# Patient Record
Sex: Female | Born: 1965 | Race: Black or African American | Hispanic: No | Marital: Single | Smoking: Current some day smoker
Health system: Southern US, Community
[De-identification: ages and names within clinical notes are randomized; demographics above are authoritative.]

## PROBLEM LIST (undated history)

## (undated) DIAGNOSIS — E119 Type 2 diabetes mellitus without complications: Secondary | ICD-10-CM

## (undated) DIAGNOSIS — I10 Essential (primary) hypertension: Secondary | ICD-10-CM

## (undated) DIAGNOSIS — I639 Cerebral infarction, unspecified: Secondary | ICD-10-CM

---

## 2004-05-21 ENCOUNTER — Ambulatory Visit: Payer: Self-pay | Admitting: Family Medicine

## 2006-06-08 DIAGNOSIS — E1129 Type 2 diabetes mellitus with other diabetic kidney complication: Secondary | ICD-10-CM | POA: Insufficient documentation

## 2012-01-11 ENCOUNTER — Emergency Department: Payer: Self-pay | Admitting: Internal Medicine

## 2012-01-11 LAB — URINALYSIS, COMPLETE
Bilirubin,UR: NEGATIVE
Glucose,UR: >=500 mg/dL (ref 0–75)
Nitrite: NEGATIVE
Ph: 7 (ref 4.5–8.0)
RBC,UR: 1 /HPF (ref 0–5)
Specific Gravity: 1.031 (ref 1.003–1.030)
Squamous Epithelial: 1
WBC UR: 1 /HPF (ref 0–5)

## 2012-01-16 ENCOUNTER — Ambulatory Visit: Payer: Self-pay | Admitting: Family Medicine

## 2012-02-02 ENCOUNTER — Ambulatory Visit: Payer: Self-pay

## 2012-02-02 LAB — HEMOGLOBIN A1C: Hemoglobin A1C: 10.3 % — ABNORMAL HIGH (ref 4.2–6.3)

## 2012-02-02 LAB — BASIC METABOLIC PANEL
Anion Gap: 7 (ref 7–16)
BUN: 11 mg/dL (ref 7–18)
Chloride: 105 mmol/L (ref 98–107)
Co2: 23 mmol/L (ref 21–32)
Creatinine: 0.66 mg/dL (ref 0.60–1.30)
EGFR (African American): >60
EGFR (Non-African Amer.): >60
Glucose: 219 mg/dL — ABNORMAL HIGH (ref 65–99)

## 2014-05-17 DIAGNOSIS — E559 Vitamin D deficiency, unspecified: Secondary | ICD-10-CM | POA: Insufficient documentation

## 2014-06-17 ENCOUNTER — Emergency Department
Admit: 2014-06-17 | Disposition: A | Discharge status: Home / Self Care | Payer: Self-pay | Admitting: Emergency Medicine

## 2014-06-17 LAB — PROTIME-INR
INR: 1
Prothrombin Time: 13.1 secs

## 2014-06-17 LAB — CBC
HCT: 27.7 % — ABNORMAL LOW (ref 35.0–47.0)
HGB: 9.1 g/dL — ABNORMAL LOW (ref 12.0–16.0)
MCH: 29.7 pg (ref 26.0–34.0)
MCHC: 32.9 g/dL (ref 32.0–36.0)
MCV: 90 fL (ref 80–100)
Platelet: 277 10*3/uL (ref 150–440)
RBC: 3.07 10*6/uL — ABNORMAL LOW (ref 3.80–5.20)
RDW: 14.9 % — AB (ref 11.5–14.5)
WBC: 10.7 10*3/uL (ref 3.6–11.0)

## 2014-06-17 LAB — BASIC METABOLIC PANEL
ANION GAP: 6 — AB (ref 7–16)
BUN: 14 mg/dL
CALCIUM: 8.5 mg/dL — AB
Chloride: 101 mmol/L
Co2: 25 mmol/L
Creatinine: 0.71 mg/dL
Glucose: 357 mg/dL — ABNORMAL HIGH
POTASSIUM: 4.6 mmol/L
Sodium: 132 mmol/L — ABNORMAL LOW

## 2014-06-17 LAB — GC/CHLAMYDIA PROBE AMP

## 2014-06-17 LAB — APTT: Activated PTT: 29.5 secs (ref 23.6–35.9)

## 2014-06-17 LAB — HCG, QUANTITATIVE, PREGNANCY: Beta Hcg, Quant.: <1 m[IU]/mL

## 2014-06-17 LAB — WET PREP, GENITAL

## 2014-08-04 ENCOUNTER — Emergency Department
Admission: EM | Admit: 2014-08-04 | Discharge: 2014-08-04 | Disposition: A | Discharge status: Home / Self Care | Payer: Self-pay | Attending: Student | Admitting: Student

## 2014-08-04 ENCOUNTER — Emergency Department: Payer: Self-pay

## 2014-08-04 ENCOUNTER — Other Ambulatory Visit: Payer: Self-pay

## 2014-08-04 DIAGNOSIS — Z79899 Other long term (current) drug therapy: Secondary | ICD-10-CM | POA: Insufficient documentation

## 2014-08-04 DIAGNOSIS — Z720 Tobacco use: Secondary | ICD-10-CM | POA: Insufficient documentation

## 2014-08-04 DIAGNOSIS — N7011 Chronic salpingitis: Secondary | ICD-10-CM | POA: Insufficient documentation

## 2014-08-04 DIAGNOSIS — E119 Type 2 diabetes mellitus without complications: Secondary | ICD-10-CM | POA: Insufficient documentation

## 2014-08-04 DIAGNOSIS — Z7982 Long term (current) use of aspirin: Secondary | ICD-10-CM | POA: Insufficient documentation

## 2014-08-04 DIAGNOSIS — I10 Essential (primary) hypertension: Secondary | ICD-10-CM | POA: Insufficient documentation

## 2014-08-04 DIAGNOSIS — R1031 Right lower quadrant pain: Secondary | ICD-10-CM

## 2014-08-04 HISTORY — DX: Type 2 diabetes mellitus without complications: E11.9

## 2014-08-04 HISTORY — DX: Essential (primary) hypertension: I10

## 2014-08-04 LAB — COMPREHENSIVE METABOLIC PANEL
ALT: 19 U/L (ref 14–54)
ANION GAP: 8 (ref 5–15)
AST: 21 U/L (ref 15–41)
Albumin: 3.9 g/dL (ref 3.5–5.0)
Alkaline Phosphatase: 56 U/L (ref 38–126)
BILIRUBIN TOTAL: 0.2 mg/dL — AB (ref 0.3–1.2)
BUN: 10 mg/dL (ref 6–20)
CALCIUM: 9.1 mg/dL (ref 8.9–10.3)
CO2: 24 mmol/L (ref 22–32)
Chloride: 104 mmol/L (ref 101–111)
Creatinine, Ser: 0.66 mg/dL (ref 0.44–1.00)
GFR calc Af Amer: >60 mL/min (ref >60)
GFR calc non Af Amer: >60 mL/min (ref >60)
Glucose, Bld: 216 mg/dL — ABNORMAL HIGH (ref 65–99)
Potassium: 3.8 mmol/L (ref 3.5–5.1)
Sodium: 136 mmol/L (ref 135–145)
TOTAL PROTEIN: 7.8 g/dL (ref 6.5–8.1)

## 2014-08-04 LAB — POCT PREGNANCY, URINE
PREG TEST UR: POSITIVE — AB
PREG TEST UR: NEGATIVE

## 2014-08-04 LAB — URINALYSIS COMPLETE WITH MICROSCOPIC (ARMC ONLY)
BILIRUBIN URINE: NEGATIVE
Bacteria, UA: NONE SEEN
Glucose, UA: >500 mg/dL — AB
Hgb urine dipstick: NEGATIVE
LEUKOCYTES UA: NEGATIVE
NITRITE: NEGATIVE
PH: 7 (ref 5.0–8.0)
Protein, ur: NEGATIVE mg/dL
SPECIFIC GRAVITY, URINE: 1.012 (ref 1.005–1.030)

## 2014-08-04 LAB — CBC
HEMATOCRIT: 27.5 % — AB (ref 35.0–47.0)
Hemoglobin: 8.6 g/dL — ABNORMAL LOW (ref 12.0–16.0)
MCH: 25 pg — ABNORMAL LOW (ref 26.0–34.0)
MCHC: 31.2 g/dL — ABNORMAL LOW (ref 32.0–36.0)
MCV: 80.1 fL (ref 80.0–100.0)
PLATELETS: 427 10*3/uL (ref 150–440)
RBC: 3.44 MIL/uL — ABNORMAL LOW (ref 3.80–5.20)
RDW: 18.4 % — ABNORMAL HIGH (ref 11.5–14.5)
WBC: 11.1 10*3/uL — AB (ref 3.6–11.0)

## 2014-08-04 LAB — LIPASE, BLOOD: LIPASE: 33 U/L (ref 22–51)

## 2014-08-04 MED ORDER — ONDANSETRON HCL 4 MG/2ML IJ SOLN
4.0000 mg | Freq: Once | INTRAMUSCULAR | Status: AC
  Administered 2014-08-04: 4 mg via INTRAVENOUS

## 2014-08-04 MED ORDER — OXYCODONE HCL 5 MG PO TABS: 5.0000 mg | ORAL_TABLET | Freq: Four times a day (QID) | ORAL | Status: DC | PRN

## 2014-08-04 MED ORDER — IBUPROFEN 600 MG PO TABS: 600.0000 mg | ORAL_TABLET | Freq: Four times a day (QID) | ORAL | Status: DC | PRN

## 2014-08-04 MED ORDER — IOHEXOL 240 MG/ML SOLN: 25.0000 mL | Freq: Once | INTRAMUSCULAR | Status: DC | PRN

## 2014-08-04 MED ORDER — MORPHINE SULFATE 4 MG/ML IJ SOLN
4.0000 mg | Freq: Once | INTRAMUSCULAR | Status: AC
  Administered 2014-08-04: 4 mg via INTRAVENOUS

## 2014-08-04 MED ORDER — ONDANSETRON HCL 4 MG/2ML IJ SOLN
INTRAMUSCULAR | Status: AC
  Administered 2014-08-04: 4 mg via INTRAVENOUS
  Filled 2014-08-04: qty 2

## 2014-08-04 MED ORDER — SODIUM CHLORIDE 0.9 % IV BOLUS (SEPSIS)
500.0000 mL | Freq: Once | INTRAVENOUS | Status: AC
  Administered 2014-08-04: 500 mL via INTRAVENOUS

## 2014-08-04 MED ORDER — IOHEXOL 300 MG/ML  SOLN
100.0000 mL | Freq: Once | INTRAMUSCULAR | Status: AC | PRN
  Administered 2014-08-04: 100 mL via INTRAVENOUS

## 2014-08-04 MED ORDER — MORPHINE SULFATE 4 MG/ML IJ SOLN
INTRAMUSCULAR | Status: AC
  Administered 2014-08-04: 4 mg via INTRAVENOUS
  Filled 2014-08-04: qty 1

## 2014-08-04 NOTE — ED Notes (Signed)
Done preg test on patient ,test was neg. Put information in meter, then meter  went blank then put on dock then results came up postive, notified dr.gayle and nurse Myriam Jacobson.

## 2014-08-04 NOTE — Discharge Instructions (Signed)
You were seen in the emergency department for right-sided abdominal pain which is caused by hydrosalpinx or swelling of the right fallopian tube. Please take all medications as prescribed and follow-up with Dr. Marcelline Mates in 4 days for reevaluation. Return immediately to the emergency room for severe or worsening pain, recurrent vomiting, blood in vomit or stool, inability to have a bowel movement, fever or for any other concerns.

## 2014-08-04 NOTE — ED Notes (Signed)
Pt c/o sudden onset right flank pain radiating into the pelvis area since 6am..states she was at work on break when the pain started..denies a hx of kidney stones

## 2014-08-04 NOTE — ED Notes (Signed)
Reports sudden onset lower abd pain today.  Tender to touch, denies n/v/d or vaginal dc.

## 2014-08-04 NOTE — ED Provider Notes (Signed)
[facility] Emergency Department Provider Note  ____________________________________________  Time seen: Approximately 9:17 AM  I have reviewed the triage vital signs and the nursing notes.   HISTORY  Chief Complaint Flank Pain and Abdominal Pain    HPI Lori Larsen is a 49 y.o. female with history of hypertension, diabetes present for evaluation of 3 hours sudden onset of flank pain radiated into the right lower quadrant. Pain is constant, sharp. Current severity is 8 out of 10. Not associated with any chest pain, difficulty breathing, nausea, vomiting, diarrhea, urinary complaints. Movement makes the pain worse. She reports the pain started suddenly while she was on her break at work.   Past Medical History  Diagnosis Date  . Diabetes mellitus without complication   . Hypertension     There are no active problems to display for this patient.   Past Surgical History  Procedure Laterality Date  . Cesarean section      Current Outpatient Rx  Name  Route  Sig  Dispense  Refill  . aspirin EC 81 MG tablet   Oral   Take 81 mg by mouth daily.         Marland Kitchen glipiZIDE (GLUCOTROL) 5 MG tablet   Oral   Take by mouth daily before breakfast.         . lisinopril (PRINIVIL,ZESTRIL) 10 MG tablet   Oral   Take 10 mg by mouth daily.         . pioglitazone (ACTOS) 30 MG tablet   Oral   Take 30 mg by mouth daily.         Marland Kitchen ibuprofen (ADVIL,MOTRIN) 600 MG tablet   Oral   Take 1 tablet (600 mg total) by mouth every 6 (six) hours as needed for moderate pain.   20 tablet   0   . oxyCODONE (ROXICODONE) 5 MG immediate release tablet   Oral   Take 1 tablet (5 mg total) by mouth every 6 (six) hours as needed for moderate pain. Do not drive while taking this medication.   15 tablet   0     Allergies Review of patient's allergies indicates no known allergies.  No family history on file.  Social History History  Substance Use Topics  .  Smoking status: Current Some Day Smoker -- 0.50 packs/day    Types: Cigarettes  . Smokeless tobacco: Never Used  . Alcohol Use: Yes     Comment: occassionally    Review of Systems Constitutional: No fever/chills Eyes: No visual changes. ENT: No sore throat. Cardiovascular: Denies chest pain. Respiratory: Denies shortness of breath. Gastrointestinal: + abdominal pain.  No nausea, no vomiting.  No diarrhea.  No constipation. Genitourinary: Negative for dysuria. Musculoskeletal: Negative for back pain. Skin: Negative for rash. Neurological: Negative for headaches, focal weakness or numbness.  10-point ROS otherwise negative.  ____________________________________________   PHYSICAL EXAM:  VITAL SIGNS: ED Triage Vitals  Enc Vitals Group     BP 08/04/14 0831 165/80 mmHg     Pulse Rate 08/04/14 0831 78     Resp 08/04/14 0831 18     Temp 08/04/14 0831 97.7 F (36.5 C)     Temp Source 08/04/14 0831 Oral     SpO2 08/04/14 0831 100 %     Weight 08/04/14 0831 147 lb (66.679 kg)     Height 08/04/14 0831 5' (1.524 m)     Head Cir --      Peak Flow --  Pain Score 08/04/14 0833 8     Pain Loc --      Pain Edu? --      Excl. in [place]? --     Constitutional: Alert and oriented. Well appearing and in no acute distress. Eyes: Conjunctivae are normal. PERRL. EOMI. Head: Atraumatic. Nose: No congestion/rhinnorhea. Mouth/Throat: Mucous membranes are moist.  Oropharynx non-erythematous. Neck: No stridor.  Cardiovascular: Normal rate, regular rhythm. Grossly normal heart sounds.  Good peripheral circulation. Respiratory: Normal respiratory effort.  No retractions. Lungs CTAB. Gastrointestinal: Soft with suprapubic and right lower quadrant tenderness, No distention. No abdominal bruits. + right CVA tenderness. Genitourinary: deferred Musculoskeletal: No lower extremity tenderness nor edema.  No joint effusions. Neurologic:  Normal speech and language. No gross focal neurologic deficits  are appreciated. Speech is normal. No gait instability. Skin:  Skin is warm, dry and intact. No rash noted. Psychiatric: Mood and affect are normal. Speech and behavior are normal.  ____________________________________________   LABS (all labs ordered are listed, but only abnormal results are displayed)  Labs Reviewed  CBC - Abnormal; Notable for the following:    WBC 11.1 (*)    RBC 3.44 (*)    Hemoglobin 8.6 (*)    HCT 27.5 (*)    MCH 25.0 (*)    MCHC 31.2 (*)    RDW 18.4 (*)    All other components within normal limits  COMPREHENSIVE METABOLIC PANEL - Abnormal; Notable for the following:    Glucose, Bld 216 (*)    Total Bilirubin 0.2 (*)    All other components within normal limits  URINALYSIS COMPLETEWITH MICROSCOPIC ([facility] ONLY) - Abnormal; Notable for the following:    Color, Urine STRAW (*)    APPearance CLEAR (*)    Glucose, UA >500 (*)    Ketones, ur 1+ (*)    Squamous Epithelial / LPF 6-30 (*)    All other components within normal limits  POCT PREGNANCY, URINE - Abnormal; Notable for the following:    Preg Test, Ur POSITIVE (*)    All other components within normal limits  LIPASE, BLOOD  POC URINE PREG, ED   ____________________________________________  EKG  ED ECG REPORT I, Joanne Gavel, the attending physician, personally viewed and interpreted this ECG.   Date: 08/04/2014  EKG Time: 09:37  Rate: 78  Rhythm: normal sinus rhythm  Axis: normal  Intervals: Normal  ST&T Change: No acute ST segment elevation, moderate voltage criteria for LVH, may be normal variant  ____________________________________________  RADIOLOGY   CT A/P  IMPRESSION: 1. Suspicion of right-sided hydrosalpinx. Consider pelvic ultrasound. 2. Normal appendix, without other explanation for right lower quadrant pain. 3. Age advanced atherosclerosis. 4. Probable left ovarian simple cyst. This could also be re-evaluated with ultrasound, given size of 4.8 cm. 5. Findings  which can be seen in pelvic congestion syndrome. 6. Possible constipation.  ____________________________________________   PROCEDURES  Procedure(s) performed: None  Critical Care performed: No  ____________________________________________   INITIAL IMPRESSION / ASSESSMENT AND PLAN / ED COURSE  Pertinent labs & imaging results that were available during my care of the patient were reviewed by me and considered in my medical decision making (see chart for details).  Lori Larsen is a 49 y.o. female with history of hypertension, diabetes present for evaluation of 3 hours sudden onset of flank pain radiated into the right lower quadrant. Vital signs stable. She does have tenderness to palpation in the right lower quadrant, in the super pubic regions as well  as right flank pain. Plan for screening labs, pain control, urinalysis and likely CT of the abdomen and pelvis to further evaluate cause of her pain.  ----------------------------------------- 2:47 PM on 08/04/2014 ----------------------------------------- CT consistent with right hydrosalpinx. Labs were very mild leukocytosis, chronic anemia which appears to be at baseline. Discussed with Dr. Enzo Bi of [facility] who recommended discharge with anti-inflammatories, pain control as well as follow up in clinic next week. I discussed this with the patient. We discussed meticulous return precautions. She is comfortable with discharge plan. Of note, urine pregnancy test was entered as positive - this was in error, the actual urine pregnancy test result was negative.  ____________________________________________   FINAL CLINICAL IMPRESSION(S) / ED DIAGNOSES  Final diagnoses:  Hydrosalpinx  RLQ abdominal pain      Joanne Gavel, MD 08/04/14 1615

## 2014-08-04 NOTE — ED Notes (Signed)
preg test was redone and results neg.

## 2014-08-04 NOTE — ED Notes (Signed)
Pt completed CT contrast.  CT aware

## 2014-08-04 NOTE — ED Notes (Signed)
Pt resting on stretcher, awaiting re-eval.  No distress.

## 2014-10-26 ENCOUNTER — Emergency Department: Payer: PRIVATE HEALTH INSURANCE

## 2014-10-26 ENCOUNTER — Encounter: Payer: Self-pay | Admitting: Emergency Medicine

## 2014-10-26 ENCOUNTER — Other Ambulatory Visit: Payer: Self-pay

## 2014-10-26 ENCOUNTER — Emergency Department
Admission: EM | Admit: 2014-10-26 | Discharge: 2014-10-26 | Disposition: A | Discharge status: Home / Self Care | Payer: PRIVATE HEALTH INSURANCE | Attending: Emergency Medicine | Admitting: Emergency Medicine

## 2014-10-26 DIAGNOSIS — R42 Dizziness and giddiness: Secondary | ICD-10-CM | POA: Insufficient documentation

## 2014-10-26 DIAGNOSIS — R1110 Vomiting, unspecified: Secondary | ICD-10-CM | POA: Diagnosis present

## 2014-10-26 DIAGNOSIS — Z720 Tobacco use: Secondary | ICD-10-CM | POA: Diagnosis not present

## 2014-10-26 DIAGNOSIS — E119 Type 2 diabetes mellitus without complications: Secondary | ICD-10-CM | POA: Insufficient documentation

## 2014-10-26 DIAGNOSIS — I10 Essential (primary) hypertension: Secondary | ICD-10-CM | POA: Diagnosis not present

## 2014-10-26 LAB — COMPREHENSIVE METABOLIC PANEL
ALBUMIN: 4.1 g/dL (ref 3.5–5.0)
ALK PHOS: 49 U/L (ref 38–126)
ALT: 12 U/L — AB (ref 14–54)
AST: 22 U/L (ref 15–41)
Anion gap: 10 (ref 5–15)
BUN: 11 mg/dL (ref 6–20)
CALCIUM: 9.5 mg/dL (ref 8.9–10.3)
CO2: 24 mmol/L (ref 22–32)
CREATININE: 0.59 mg/dL (ref 0.44–1.00)
Chloride: 104 mmol/L (ref 101–111)
GFR calc Af Amer: >60 mL/min (ref >60)
GFR calc non Af Amer: >60 mL/min (ref >60)
GLUCOSE: 206 mg/dL — AB (ref 65–99)
Potassium: 3.8 mmol/L (ref 3.5–5.1)
SODIUM: 138 mmol/L (ref 135–145)
Total Bilirubin: 0.5 mg/dL (ref 0.3–1.2)
Total Protein: 8.1 g/dL (ref 6.5–8.1)

## 2014-10-26 LAB — CBC
HCT: 33.4 % — ABNORMAL LOW (ref 35.0–47.0)
Hemoglobin: 10.6 g/dL — ABNORMAL LOW (ref 12.0–16.0)
MCH: 24.7 pg — AB (ref 26.0–34.0)
MCHC: 31.6 g/dL — ABNORMAL LOW (ref 32.0–36.0)
MCV: 78.2 fL — ABNORMAL LOW (ref 80.0–100.0)
PLATELETS: 362 10*3/uL (ref 150–440)
RBC: 4.27 MIL/uL (ref 3.80–5.20)
RDW: 22.2 % — ABNORMAL HIGH (ref 11.5–14.5)
WBC: 11.4 10*3/uL — ABNORMAL HIGH (ref 3.6–11.0)

## 2014-10-26 MED ORDER — MECLIZINE HCL 25 MG PO TABS: 25.0000 mg | ORAL_TABLET | Freq: Three times a day (TID) | ORAL | Status: DC | PRN

## 2014-10-26 MED ORDER — DIAZEPAM 5 MG PO TABS: 5.0000 mg | ORAL_TABLET | Freq: Three times a day (TID) | ORAL | Status: DC | PRN

## 2014-10-26 MED ORDER — DIAZEPAM 5 MG/ML IJ SOLN
5.0000 mg | Freq: Once | INTRAMUSCULAR | Status: AC
  Administered 2014-10-26: 5 mg via INTRAVENOUS

## 2014-10-26 MED ORDER — ONDANSETRON HCL 4 MG/2ML IJ SOLN
4.0000 mg | Freq: Once | INTRAMUSCULAR | Status: AC
  Administered 2014-10-26: 4 mg via INTRAVENOUS

## 2014-10-26 MED ORDER — SODIUM CHLORIDE 0.9 % IV SOLN
1000.0000 mL | Freq: Once | INTRAVENOUS | Status: AC
  Administered 2014-10-26: 1000 mL via INTRAVENOUS

## 2014-10-26 MED ORDER — MECLIZINE HCL 25 MG PO TABS
ORAL_TABLET | ORAL | Status: AC
  Filled 2014-10-26: qty 2

## 2014-10-26 MED ORDER — MECLIZINE HCL 25 MG PO TABS
50.0000 mg | ORAL_TABLET | Freq: Once | ORAL | Status: AC
  Administered 2014-10-26: 50 mg via ORAL

## 2014-10-26 MED ORDER — ONDANSETRON HCL 4 MG/2ML IJ SOLN
INTRAMUSCULAR | Status: AC
  Filled 2014-10-26: qty 2

## 2014-10-26 MED ORDER — DIAZEPAM 5 MG/ML IJ SOLN
INTRAMUSCULAR | Status: AC
  Filled 2014-10-26: qty 2

## 2014-10-26 NOTE — ED Notes (Signed)
Pt reports vomiting and dizziness..  Pt also has states having diff hearing out of right ear.  No abd pain.  No diarrhea.  Pt actively vomiting now.  Sx began last night.

## 2014-10-26 NOTE — Discharge Instructions (Signed)

## 2014-10-26 NOTE — ED Notes (Signed)
Pt presents with vomiting that started last night, denies any diarrhea or abd pain.

## 2014-10-26 NOTE — ED Provider Notes (Signed)
[facility] Emergency Department Provider Note     Time seen: ----------------------------------------- 6:32 PM on 10/26/2014 -----------------------------------------    I have reviewed the triage vital signs and the nursing notes.   HISTORY  Chief Complaint Emesis    HPI Lori Larsen is a 49 y.o. female who presents ER with sudden onset of vomiting and dizziness. Patient reports not being able to hear well out of the right ear. Patient has had this happen once before, I brought an actively vomiting. Patient states symptoms began last night, when she moves suddenly the symptoms seemed to worsen. Denies any fevers chills or other complaints. Denies any recent illness.   Past Medical History  Diagnosis Date  . Diabetes mellitus without complication   . Hypertension     There are no active problems to display for this patient.   Past Surgical History  Procedure Laterality Date  . Cesarean section      Allergies Review of patient's allergies indicates no known allergies.  Social History Social History  Substance Use Topics  . Smoking status: Current Some Day Smoker -- 0.50 packs/day    Types: Cigarettes  . Smokeless tobacco: Never Used  . Alcohol Use: Yes     Comment: occassionally    Review of Systems Constitutional: Negative for fever. Eyes: Negative for visual changes. ENT: Negative for sore throat. Positive for decreased hearing in the right ear Cardiovascular: Negative for chest pain. Respiratory: Negative for shortness of breath. Gastrointestinal: Negative for abdominal pain, vomiting and diarrhea. Genitourinary: Negative for dysuria. Musculoskeletal: Negative for back pain. Skin: Negative for rash. Neurological: Negative for headaches, focal weakness or numbness. Positive for dizziness  10-point ROS otherwise negative.  ____________________________________________   PHYSICAL EXAM:  VITAL SIGNS: ED Triage Vitals   Enc Vitals Group     BP 10/26/14 1814 200/88 mmHg     Pulse Rate 10/26/14 1814 68     Resp 10/26/14 1814 20     Temp 10/26/14 1814 98.4 F (36.9 C)     Temp Source 10/26/14 1814 Oral     SpO2 10/26/14 1814 100 %     Weight 10/26/14 1814 141 lb (63.957 kg)     Height 10/26/14 1814 5' (1.524 m)     Head Cir --      Peak Flow --      Pain Score --      Pain Loc --      Pain Edu? --      Excl. in [place]? --     Constitutional: Alert and oriented. Mild distress. Eyes: Conjunctivae are normal. PERRL. Normal extraocular movements. No nystagmus ENT   Head: Normocephalic and atraumatic.   Nose: No congestion/rhinnorhea.   Mouth/Throat: Mucous membranes are moist.   Neck: No stridor. Cardiovascular: Normal rate, regular rhythm. Normal and symmetric distal pulses are present in all extremities. No murmurs, rubs, or gallops. Respiratory: Normal respiratory effort without tachypnea nor retractions. Breath sounds are clear and equal bilaterally. No wheezes/rales/rhonchi. Gastrointestinal: Soft and nontender. No distention. No abdominal bruits.  Musculoskeletal: Nontender with normal range of motion in all extremities. No joint effusions.  No lower extremity tenderness nor edema. Neurologic:  Normal speech and language. No gross focal neurologic deficits are appreciated. Speech is normal. No gait instability. Skin:  Skin is warm, dry and intact. No rash noted. Psychiatric: Mood and affect are normal. Speech and behavior are normal. Patient exhibits appropriate insight and judgment. ____________________________________________  EKG: Interpreted by me. Normal sinus rhythm with  a rate of 71 bpm, normal PR interval, normal QS with, LVH  ____________________________________________  ED COURSE:  Pertinent labs & imaging results that were available during my care of the patient were reviewed by me and considered in my medical decision making (see chart for details). Symptoms consistent  with peripheral vertigo. We'll confirm with labs and CT. She'll receive IV fluids, IV Valium, Zofran and meclizine. ____________________________________________    LABS (pertinent positives/negatives)  Labs Reviewed  COMPREHENSIVE METABOLIC PANEL - Abnormal; Notable for the following:    Glucose, Bld 206 (*)    ALT 12 (*)    All other components within normal limits  CBC - Abnormal; Notable for the following:    WBC 11.4 (*)    Hemoglobin 10.6 (*)    HCT 33.4 (*)    MCV 78.2 (*)    MCH 24.7 (*)    MCHC 31.6 (*)    RDW 22.2 (*)    All other components within normal limits  URINALYSIS COMPLETEWITH MICROSCOPIC ([facility] ONLY)    RADIOLOGY Images were viewed by me  CT head FINDINGS: The ventricles are normal in size and configuration. No extra-axial fluid collections are identified. The gray-white differentiation is normal. No CT findings for acute intracranial process such as hemorrhage or infarction. No mass lesions. The brainstem and cerebellum are grossly normal.  The bony structures are intact. The paranasal sinuses and mastoid air cells are clear. The globes are intact.  IMPRESSION: Normal head CT  ____________________________________________  FINAL ASSESSMENT AND PLAN  Vertigo  Plan: Patient with labs and imaging as dictated above. Patient is feeling better, requesting to go home. Her labs and CT were unremarkable. She'll be discharged with meclizine and Valium. She is stable for follow-up with ENT.   Earleen Newport, MD   Earleen Newport, MD 10/26/14 [id number]

## 2014-11-16 ENCOUNTER — Ambulatory Visit: Payer: Self-pay | Admitting: Obstetrics and Gynecology

## 2014-12-05 ENCOUNTER — Ambulatory Visit: Payer: PRIVATE HEALTH INSURANCE | Attending: Otolaryngology

## 2014-12-05 ENCOUNTER — Encounter: Payer: Self-pay | Admitting: Physical Therapy

## 2014-12-05 DIAGNOSIS — R29818 Other symptoms and signs involving the nervous system: Secondary | ICD-10-CM | POA: Insufficient documentation

## 2014-12-05 DIAGNOSIS — R2689 Other abnormalities of gait and mobility: Secondary | ICD-10-CM

## 2014-12-05 DIAGNOSIS — R42 Dizziness and giddiness: Secondary | ICD-10-CM

## 2014-12-05 NOTE — Patient Instructions (Signed)
Issued VOR x 1 horizontal 1 min x 3, 4 times/day for HEP

## 2014-12-05 NOTE — Therapy (Signed)
[place] MAIN [facility] SERVICES [address] [place], [place], [address] Phone: [phone]   Fax:  [phone]  Physical Therapy Evaluation  Patient Details  Name: Lori Larsen MRN: [id number] Date of Birth: [birth year] Referring Provider:  Carloyn Manner, MD  Encounter Date: 12/05/2014      PT End of Session - 12/05/14 1210    Visit Number 1   Number of Visits 7   Date for PT Re-Evaluation 01/19/15   Authorization Type no g codes   PT Start Time 1100   PT Stop Time 1200   PT Time Calculation (min) 60 min   Activity Tolerance Patient tolerated treatment well   Behavior During Therapy [facility] for tasks assessed/performed      Past Medical History  Diagnosis Date  . Diabetes mellitus without complication   . Hypertension     Past Surgical History  Procedure Laterality Date  . Cesarean section      There were no vitals filed for this visit.  Visit Diagnosis:  Dizziness and giddiness - Plan: PT plan of care cert/re-cert  Balance disorder - Plan: PT plan of care cert/re-cert      Subjective Assessment - 12/05/14 1206    Subjective Dizziness improving since onset 10/25/14   Pertinent History  Pt reports that she was at work and started having nausea and vomiting. Pt was having vertigo and fell at work needing help to stand up. Son brought patient to emergency room where she was diagnosed with vertigo. Head CT was negative at that time. Pt reports she had decreased hearing in R ear as well. She was referred to ENT where she saw Dr. Virgia Land. She was placed on a steroid which she reports helped a little. Symptoms gradually improved over time. Pt reports she had VNG study which showed "50% loss." Pt unclear if it is loss of hearing or caloric deficit. Pt reports intermittent muffling in R ear which has persisted. Pt denies numbness/tingling in face, trunk or UE/LE. Pt reports some new onset RUE weakness since symptoms started.    Currently in Pain? No/denies      VESTIBULAR AND BALANCE EVALUATION  Onset Date:  10/25/14   HISTORY:  Symptoms at onset: nausea, vomiting, and vertigo Description of dizziness: (vertigo, unsteadiness, lightheadedness, falling, general unsteadiness, aural fullness)____"A little dizzy." Denies further vertigo____________ Symptom nature: (motion provoked/positional/spontaneous/constant, variable, intermittent) ___Intermittent, motion provoked. Never spontaneous___ Frequency: Every other day Duration: 30 min to 1 hour  Provocative Factors: Standing up quickly, turn head quickly Easing Factors: Staying still Progression of symptoms: (better, worse, no change since onset): Better History of similar episodes: No Falls (yes/no): Yes Number of falls in past 6 months: 2 falls (one since symptoms started)  Subjective history of current problem: Pt reports that she was at work and started having nausea and vomiting. Pt was having vertigo and fell at work needing help to stand up. Son brought patient to emergency room where she was diagnosed with vertigo. Head CT was negative at that time. Pt reports she had decreased hearing in R ear as well. She was referred to ENT where she saw Dr. Virgia Land. She was placed on a steroid which she reports helped a little. Symptoms gradually improved over time. Pt reports she had VNG study which showed "50% loss." Pt unclear if it is loss of hearing or caloric deficit. Pt reports intermittent muffling in R ear which has persisted. Pt denies numbness/tingling in face, trunk or UE/LE. Pt reports some new  onset RUE weakness since symptoms started.   Subjective Prior Functional Level: Fully independent, working prior to onset of symptoms.   Auditory complaints (tinnitus, pain, drainage): No Vision: (last eye exam, diplopia, recent changes): No changes currently, initially some blurring of vision. Last eye exam was 2 years ago.   Current Symptoms: (vertigo, N & V,  dysarthria, dysphagia, drop attacks, bowel and bladder changes, recent weight loss/gain, lightheadedness, headache, rocking, general unsteadiness, imbalance, tilting, swaying, veering, dizzy, oscillopsia, migraines): Negative for red flags. Intermittent headaches, no history of migraines.   Goals for Therapy: "Improve balance"  EXAMINATION:  POSTURE:_Slight forward head and neck. Overall grossly WNL______  NEUROLOGICAL SCREEN: (2+ unless otherwise noted.)  Level Dermatome R L Myotome R L Reflex R L  C3 Anterior Neck N N Sidebend C2-3 N N Jaw CN V    C4 Top of Shoulder N N Shoulder Shrug C4 N N Hoffman's UMN N N  C5 Lateral Upper Arm N N Shoulder ABD C4-5 N N Biceps C5-6 1 1   C6 Lateral Arm/ Thumb N N Arm Flex/ Wrist Ext C5-6 N N Brachiorad. C5-6 1 1   C7 Middle Finger N N Arm Ext//Wrist Flex C6-7 N N Triceps C7    C8 4th & 5th Finger N N Flex/ Ext Carpi Ulnaris C8 N N Patella 2 3  T1 Medial Arm N N Interossei T1 N N Gastrocnemius 1 0  L2 Medial thigh/groin N N Illiopsoas (L2-3) N N     L3 Lower thigh/med.knee N N Quadriceps (L3-4) N N     L4 Medial leg/lat thigh N N Tibialis Ant (L4-5) N N     L5 Lat. leg & dorsal foot N N EHL (L5) N N     S1 post/lat foot/thigh/leg N N Gastrocnemius (S1-2) N N Gastrocnemius (S1) N N  S2 Post./med. thigh & leg N N Hamstrings (L4-S3) N N Babinski N A   N=normal  Ab=abnormal  Clonus = 4+ (Reflexes only)  Hyper = 3+      Normal = 2+     Hypo = 1+   Absent=0  No clonus spasticity or rigidity    SOMATOSENSORY:        Sensation           Intact      Diminished         Absent    Hypersenstive  Light touch N Bil UE/LE     Sharp / dull      Proprioception      Kinesthesia      Other:      Any N & T in extremities or weakness:      COORDINATION: Rapid Alt Movements:  Normal Finger to Nose: Normal Pronator Drift: Normal    MUSCULOSKELETAL SCREEN: Cervical Spine ROM:  WNL and painless  ROM: WFL  MMT:  WFL, at least 4 to 4+/5 throughout UE/LE  without focal weakness identified. Grip strength is grossly symmetrical, pt is R handed  Functional Mobility: No deficits identified  Gait:Grossly functional without obvious deficits. Pt does have mild lateral veering but easily self-corrected. Full gait assessment with head turning deferred at this time.  Scanning of visual environment with gait is: Good  Balance: Standardized-balance testing deferred due to patient education.     OCULOMOTOR / VESTIBULAR TESTING:  Oculomotor Exam- Room Light  Normal Abnormal Comments  Ocular Alignment N    Ocular ROM N    Spontaneous Nystagmus N    End-Gaze Nystagmus  A Mid and  end range abnormal nystagmus to the L, appropriate end range nystagmus to the R  Smooth Pursuit  A Mid range L horizontal nystagmus, absent with R gaze, saccades throughout smooth pursuit  Saccades  A Poor trajectory in bilaterally in both horizontal and vertical plane  VOR  A 4/10 Dizziness, no blurring  VOR Cancellation  A Dizziness  Left Head Thrust N    Right Head Thrust  A Corrective saccade with dizziness  Head Shaking Nystagmus   Deferred due to severe dizziness with attempt  Static Acuity     Dynamic Acuity       BPPV TESTS:  Symptoms Duration Intensity Nystagmus  L Dix-Hallpike Negative   No  R Dix-Hallpike Negative   No  L Head Roll      R Head Roll      L Sidelying Test      R Sidelying Test        MEASURES:  Results Comments  DHI 36/100   ABC Scale 76.25% Above cut-off for fall risk  DGI    10 meter Walking Speed    FTSTS    SLS    Berg Balance Test    TUG    TUG- Carry    TUG- Cognitive            [facility] PT Assessment - 12/05/14 0001    Assessment   Medical Diagnosis Vestibular neuronitis   Onset Date/Surgical Date 10/25/14   Next MD Visit Not provided   Prior Therapy No   Precautions   Precautions None   Restrictions   Weight Bearing Restrictions No   Balance Screen   Has the patient fallen in the past 6 months Yes   How many  times? 2   Has the patient had a decrease in activity level because of a fear of falling?  Yes   Is the patient reluctant to leave their home because of a fear of falling?  Yes   [place] residence   Type of [place] to enter   Entrance Stairs-Number of Steps 3   Entrance Stairs-Rails None   Home Layout One level   Prior Function   Level of Independence Independent   Vocation Full time employment  Out of work since onset of symptoms   Cognition   Overall Cognitive Status Within Functional Limits for tasks assessed   Observation/Other Assessments   Other Surveys  Other Surveys   Activities of Balance Confidence Scale (ABC Scale)  76.25%   Dizziness Handicap Inventory ([place])  36/100       TREATMENT: Pt educated and provided handout regarding VOR x 1 horizontal. Performed for 60 seconds x 2 with patient with heavy verbal and tactile cues for correction of form/technique. Pt reports significant dizziness during exercise. She becomes tearful due to frustration. Education and comfort provided regarding prognosis.                     PT Education - 12/05/14 1209    Education provided Yes   Education Details HEP, education about diagnosis and prognosis   Person(s) Educated Patient   Methods Explanation;Demonstration;Handout   Comprehension Verbalized understanding;Returned demonstration             PT Long Term Goals - 12/05/14 1212    PT LONG TERM GOAL #1   Title Pt will report independence with HEP for self-management of symptoms by 01/19/15   Status New  PT LONG TERM GOAL #2   Title Pt will demonstrate decrease in [place] by 18 points in order to demonstrate clinically significant reduction in symptoms and disability by 01/19/15   Baseline 12/05/14: 36/100   Status New   PT LONG TERM GOAL #3   Title Pt will report no further epsiodes of dizziness in order to resume driving and working without  limitations by 01/19/15   Status New               Plan - 12/05/14 1210    Clinical Impression Statement Pt is a pleasant 49 year-old female 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 Pt reports symptoms are improving but she still has episodes every other day which last 30 minutes to 1 hour. PT examination reveals no spontaneous nystamgus but L horizontal nystagmus at mid and end range L gaze. Nystagmus worsens with L gaze and resolves with R gaze. Positive VOR. Positive VOR head thrust to the R with corrective saccades noted (negative to the L). Poor trajectory with horizontal and vertical saccade testing as well as smooth pursuit. Patient has some central and peripheral signs but symptoms are more consistent with a R unilateral caloric deficit. Pt reports abnormal VNG testing but is unable to recall results. Pt will benefit from skilled vestibular therapy to address deficits noted above to improve balance and decrease symptoms so she can return to full function at home and work.    Pt will benefit from skilled therapeutic intervention in order to improve on the following deficits Dizziness;Abnormal gait   Rehab Potential Excellent   Clinical Impairments Affecting Rehab Potential Positive: motivation, recent onset, Negative: age   PT Frequency 1x / week   PT Duration 6 weeks   PT Treatment/Interventions Canalith Repostioning;Gait training;Therapeutic activities;Therapeutic exercise;Balance training;Neuromuscular re-education;Manual techniques;Vestibular   PT Next Visit Plan BERG, DGI, firm/foam; review and progress VOR   PT Home Exercise Plan VOR x 1 horizontal 1 min x 3, 4 times/day   Consulted and Agree with Plan of Care Patient         Problem List There are no active problems to display for this patient.  Phillips Grout PT, DPT   Huprich,Jason 12/05/2014, 12:27 PM  [place] MAIN [facility]  SERVICES [address] [place], [place], [address] Phone: [phone]   Fax:  [phone]

## 2014-12-14 ENCOUNTER — Ambulatory Visit: Payer: Self-pay | Admitting: Obstetrics and Gynecology

## 2014-12-18 ENCOUNTER — Encounter: Payer: Self-pay | Admitting: Physical Therapy

## 2014-12-18 ENCOUNTER — Ambulatory Visit: Payer: PRIVATE HEALTH INSURANCE | Attending: Otolaryngology

## 2014-12-18 DIAGNOSIS — R29818 Other symptoms and signs involving the nervous system: Secondary | ICD-10-CM | POA: Insufficient documentation

## 2014-12-18 DIAGNOSIS — R42 Dizziness and giddiness: Secondary | ICD-10-CM | POA: Diagnosis present

## 2014-12-18 DIAGNOSIS — R2689 Other abnormalities of gait and mobility: Secondary | ICD-10-CM

## 2014-12-18 NOTE — Therapy (Signed)
[place] MAIN [facility] SERVICES [address] [place], [place], [address] Phone: [phone]   Fax:  [phone]  Physical Therapy Treatment  Patient Details  Name: MAEGEN WIGLE MRN: [id number] Date of Birth: 1965/06/30 Referring Provider:  Carloyn Manner, MD  Encounter Date: 12/18/2014      PT End of Session - 12/18/14 1058    Visit Number 2   Number of Visits 7   Date for PT Re-Evaluation 01/19/15   Authorization Type no g codes   PT Start Time 0920   PT Stop Time 1005   PT Time Calculation (min) 45 min   Equipment Utilized During Treatment Gait belt   Activity Tolerance Patient tolerated treatment well   Behavior During Therapy [facility] for tasks assessed/performed      Past Medical History  Diagnosis Date  . Diabetes mellitus without complication ([place])   . Hypertension     Past Surgical History  Procedure Laterality Date  . Cesarean section      There were no vitals filed for this visit.  Visit Diagnosis:  Dizziness and giddiness  Balance disorder      Subjective Assessment - 12/18/14 0921    Subjective Pt reports her symptoms are improving. "I haven't been really dizzy lately." HEP went well without any questions or concerns. She was able to perform them consistently.    Pertinent History  Pt reports that she was at work and started having nausea and vomiting. Pt was having vertigo and fell at work needing help to stand up. Son brought patient to emergency room where she was diagnosed with vertigo. Head CT was negative at that time. Pt reports she had decreased hearing in R ear as well. She was referred to ENT where she saw Dr. Virgia Land. She was placed on a steroid which she reports helped a little. Symptoms gradually improved over time. Pt reports she had VNG study which showed "50% loss." Pt unclear if it is loss of hearing or caloric deficit. Pt reports intermittent muffling in R ear which has persisted. Pt denies  numbness/tingling in face, trunk or UE/LE. Pt reports some new onset RUE weakness since symptoms started.    Currently in Pain? No/denies            [facility] PT Assessment - 12/18/14 0001    Standardized Balance Assessment   Standardized Balance Assessment Berg Balance Test;Dynamic Gait Index   Berg Balance Test   Sit to Stand Able to stand without using hands and stabilize independently   Standing Unsupported Able to stand safely 2 minutes   Sitting with Back Unsupported but Feet Supported on Floor or Stool Able to sit safely and securely 2 minutes   Stand to Sit Sits safely with minimal use of hands   Transfers Able to transfer safely, minor use of hands   Standing Unsupported with Eyes Closed Able to stand 10 seconds safely   Standing Ubsupported with Feet Together Able to place feet together independently and stand 1 minute safely   From Standing, Reach Forward with Outstretched Arm Can reach confidently >25 cm (10")   From Standing Position, Pick up Object from Floor Able to pick up shoe safely and easily   From Standing Position, Turn to Look Behind Over each Shoulder Looks behind from both sides and weight shifts well   Turn 360 Degrees Able to turn 360 degrees safely in 4 seconds or less   Standing Unsupported, Alternately Place Feet on Step/Stool Able to stand  independently and safely and complete 8 steps in 20 seconds   Standing Unsupported, One Foot in Front Able to plae foot ahead of the other independently and hold 30 seconds   Standing on One Leg Able to lift leg independently and hold equal to or more than 3 seconds   Total Score 53   Berg comment: Single leg stance approximately 4.5 seconds   Dynamic Gait Index   Level Surface Normal   Change in Gait Speed Normal   Gait with Horizontal Head Turns Normal   Gait with Vertical Head Turns Normal   Gait and Pivot Turn Normal   Step Over Obstacle Normal   Step Around Obstacles Normal   Steps Normal   Total Score 24   DGI  comment: No dificits noted   High Level Balance   High Level Balance Comments Firm/foam: >30 seconds on firm with eyes open/closed. Foam: eyes open: 16.5 seconds, eyes closed: 3 seconds, fall always occurs to the L        Performance Measures: Performed BERG, DGI, Firm/foam with patient; See above for results;  Neuromuscular Re-education: NBOS eyes open/closed x 30 seconds, added horizontal and vertical head turns followed by horizontal head and body turns x 30 seconds each; Semi-tandem progression  eyes open/closed x 30 seconds, added horizontal and vertical head turns followed by horizontal head and body turns x 30 seconds each; tandem eyes open/closed followed by horizontal head turns;  VOR: VOR x 1 horizontal in sitting 60 seconds x 2; VOR x 1 horizontal in standing 60 seconds x 2; Cues required to correct form and speed. Pt is stopping and end range and moving far too slow. Eyes occasionally deviate from target. Pt requires correction and is able to integrate cues from therapist. Pt denies dizziness during VOR exercise;  Printed HEP issued and reviewed with patient to ensure understanding.                      PT Education - 12/18/14 1058    Education provided Yes   Education Details HEP correction and progression   Person(s) Educated Patient   Methods Explanation;Demonstration;Handout;Verbal cues   Comprehension Verbalized understanding;Returned demonstration             PT Long Term Goals - 12/18/14 1102    PT LONG TERM GOAL #1   Title Pt will report independence with HEP for self-management of symptoms by 01/19/15   Status New   PT LONG TERM GOAL #2   Title Pt will demonstrate decrease in [place] by 18 points in order to demonstrate clinically significant reduction in symptoms and disability by 01/19/15   Baseline 12/05/14: 36/100   Status New   PT LONG TERM GOAL #3   Title Pt will report no further epsiodes of dizziness in order to resume driving and  working without limitations by 01/19/15   Status New   PT LONG TERM GOAL #4   Title Pt will demonstrate improvement in single leg stance to >10 seconds in order to decrease fall risk by 01/19/15   Baseline 12/18/14: 4.5 seconds   Status New   PT LONG TERM GOAL #5   Title Pt will demonstrate ability to maintain balance with NBOS eyes closed on Airex pad for >10 seconds in order to decrease fall risk on complaint surfaces in low light by 01/19/15   Baseline 12/18/14: 3 seconds   Status New  Plan - 12/18/14 1059    Clinical Impression Statement Pt reports significnat symptom reduction since initial evaluation. Unable to provoke dizziness today with VOR or head turning activities. Higher level balance deficits identified on the BERG with single leg stance <5 seconds. DGI is 24/24 without gait deviations with head turns. Pt provided progression of HEP. Encouraged to follow-up as scheduled.    Pt will benefit from skilled therapeutic intervention in order to improve on the following deficits Dizziness;Abnormal gait   Rehab Potential Excellent   Clinical Impairments Affecting Rehab Potential Positive: motivation, recent onset, Negative: age   PT Frequency 1x / week   PT Duration 6 weeks   PT Treatment/Interventions Canalith Repostioning;Gait training;Therapeutic activities;Therapeutic exercise;Balance training;Neuromuscular re-education;Manual techniques;Vestibular   PT Next Visit Plan Progress VOR to Airex, continue modified tandem progressions, attempt ball tosses with ambulation   PT Home Exercise Plan VOR x 1 horizontal 1 min x 3, 4 times/day NBOS, semi-tandem progression with eyes open/closed and horizontal head turns   Consulted and Agree with Plan of Care Patient        Problem List There are no active problems to display for this patient.  Phillips Grout PT, DPT   Anistyn Graddy 12/18/2014, 11:05 AM  [place] MAIN [facility]  SERVICES [address] [place], [place], [address] Phone: [phone]   Fax:  [phone]

## 2014-12-18 NOTE — Patient Instructions (Addendum)
Feet Heel-Toe "Tandem", Head Motion - Eyes Open    Start with feet slightly wider. Heel of forward foot should be just past toes of rear foot. With eyes open, right foot directly in front of the other keep eyes open for 30 seconds. Then turn head left and right for 30 seconds. Finally close eyes for 30 seconds. Alternate feet and repeat cycle 2 more times. Perform 3 sessions per day.   Gaze Stabilization Modify your current exercise to perform in standing with feet together. Perform for 60 seconds and repeat 2 more times (3 times total). Perform 3 sessions per day.

## 2014-12-27 ENCOUNTER — Encounter: Payer: Self-pay | Admitting: Physical Therapy

## 2014-12-27 ENCOUNTER — Ambulatory Visit: Payer: PRIVATE HEALTH INSURANCE | Admitting: Physical Therapy

## 2014-12-27 DIAGNOSIS — R42 Dizziness and giddiness: Secondary | ICD-10-CM

## 2014-12-27 DIAGNOSIS — R2689 Other abnormalities of gait and mobility: Secondary | ICD-10-CM

## 2014-12-27 NOTE — Therapy (Signed)
[place] MAIN [facility] SERVICES [address] [place], [place], [address] Phone: [phone]   Fax:  [phone]  Physical Therapy Treatment  Patient Details  Name: Lori Larsen MRN: [id number] Date of Birth: 16-May-1965 No Data Recorded  Encounter Date: 12/27/2014      PT End of Session - 12/27/14 1010    Visit Number 3   Number of Visits 7   Date for PT Re-Evaluation 01/19/15   Authorization Type no g codes   PT Start Time 0925   PT Stop Time 1006   PT Time Calculation (min) 41 min   Equipment Utilized During Treatment Gait belt   Activity Tolerance Patient tolerated treatment well   Behavior During Therapy [facility] for tasks assessed/performed      Past Medical History  Diagnosis Date  . Diabetes mellitus without complication ([place])   . Hypertension     Past Surgical History  Procedure Laterality Date  . Cesarean section      There were no vitals filed for this visit.  Visit Diagnosis:  Dizziness and giddiness  Balance disorder      Subjective Assessment - 12/27/14 0928    Subjective Pt states she feels she is improving every week. Pt reports that she had dizziness episode only once since when she started to do the exercises for HEP. Pt states she felt she was going too fast when she first started to try to do her HEP. Pt states that she has been out of work since August and that she is eager to get back to work. Pt states that she had an MD appointment this past 06-27-22 and MD told her that they would reasses whether she could return to work once PT services as completed. 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 Pt states it is a loud, busy environment where she does a lot of standing, walking and occassionally needs to climb up on equipment sometimes with places to hold for support and sometimes without. Pt states she wants to return to work but wants to make sure that her balance is good enough to do so  safely.   Pertinent History  Pt reports that she was at work and started having nausea and vomiting. Pt was having vertigo and fell at work needing help to stand up. Son brought patient to emergency room where she was diagnosed with vertigo. Head CT was negative at that time. Pt reports she had decreased hearing in R ear as well. She was referred to ENT where she saw Dr. Virgia Land. She was placed on a steroid which she reports helped a little. Symptoms gradually improved over time. Pt reports she had VNG study which showed "50% loss." Pt unclear if it is loss of hearing or caloric deficit. Pt reports intermittent muffling in R ear which has persisted. Pt denies numbness/tingling in face, trunk or UE/LE. Pt reports some new onset RUE weakness since symptoms started.       Neuromuscular Re-education:  VOR exercise: In standing on Airex pad, pt performed VOR X 1 horiz 3 reps of 1 minute each. Pt required one cue for amount of head excursion as she was not moving head enough less than 20 degrees left/right. Pt able to perform correctly after initial cue. Pt required CGA. Pt denies dizziness.    Diona Foley toss to self: Performed static ball toss to self horiz while turning head and eyes to follow ball. Then, worked on 56' trials of  forward ambulation while doing ball toss to self horiz.  Ball toss over shoulder: Pt ambulated 88' trials while tossing ball over one shoulder with return catch over opposite shoulder at shoulder height and then at varying heights-below waist, shoulder height and top of head levels.  Ball Dribbling Activity:  Pt performed basketball dribbling from side to side while ambulating while tracking ball with eyes and head.   Walking while scanning for visual targets: Performed 170' trials of forwards ambulation while scanning for visual targets in hallway with CGA.  Ascending/Descending Steps: Pt ambulated up/down 4 steps without UEs support. Then, performed multiple trials of up/down steps while  doing alternate cone tapping activity about 5-6 trials. Performed up/down steps while doing cognitive naming tasks. Pt required min A with dual tasks while ascending/descending steps activity.          PT Education - 12/27/14 1009    Education provided Yes   Education Details HEP correction and progression   Person(s) Educated Patient   Methods Explanation;Demonstration;Handout   Comprehension Verbalized understanding             PT Long Term Goals - 12/18/14 1102    PT LONG TERM GOAL #1   Title Pt will report independence with HEP for self-management of symptoms by 01/19/15   Status New   PT LONG TERM GOAL #2   Title Pt will demonstrate decrease in [place] by 18 points in order to demonstrate clinically significant reduction in symptoms and disability by 01/19/15   Baseline 12/05/14: 36/100   Status New   PT LONG TERM GOAL #3   Title Pt will report no further epsiodes of dizziness in order to resume driving and working without limitations by 01/19/15   Status New   PT LONG TERM GOAL #4   Title Pt will demonstrate improvement in single leg stance to >10 seconds in order to decrease fall risk by 01/19/15   Baseline 12/18/14: 4.5 seconds   Status New   PT LONG TERM GOAL #5   Title Pt will demonstrate ability to maintain balance with NBOS eyes closed on Airex pad for >10 seconds in order to decrease fall risk on complaint surfaces in low light by 01/19/15   Baseline 12/18/14: 3 seconds   Status New               Plan - 12/27/14 1259    Clinical Impression Statement Pt denies dizziness during session this date and did well wtih high level balance activities. Pt was challenged by ascending/descending steps while doing dual tasks activity requiring CGA to MIn A. Pt continues to report significant improvement in her overall symptoms but states she would like to further work on improving her balance. Continue PT services working towards goals.    Pt will benefit from skilled  therapeutic intervention in order to improve on the following deficits Dizziness;Abnormal gait   Rehab Potential Excellent   Clinical Impairments Affecting Rehab Potential Positive: motivation, recent onset, Negative: age   PT Frequency 1x / week   PT Duration 6 weeks   PT Treatment/Interventions Canalith Repostioning;Gait training;Therapeutic activities;Therapeutic exercise;Balance training;Neuromuscular re-education;Manual techniques;Vestibular   PT Next Visit Plan Consider ambulating in busy environment while doing head turns and scanning for targets to simulate patient's work environment and ambulating up/down steps with dual tasks. Consider VOR with busy background progression.    PT Home Exercise Plan VOR x 1 horizontal 1 min x 3, 4 times/day NBOS, semi-tandem progression with eyes open/closed and horizontal head  turns   Consulted and Agree with Plan of Care Patient        Problem List There are no active problems to display for this patient.  Lady Deutscher PT, DPT Lady Deutscher 12/27/2014, 1:22 PM  [place] MAIN [facility] SERVICES [address] [place], [place], [address] Phone: [phone]   Fax:  [phone]  Name: JANAYIA BURGGRAF MRN: [id number] Date of Birth: 11-20-1965

## 2015-01-01 ENCOUNTER — Ambulatory Visit: Payer: PRIVATE HEALTH INSURANCE

## 2015-01-01 VITALS — BP 155/80 | HR 87

## 2015-01-01 DIAGNOSIS — R2689 Other abnormalities of gait and mobility: Secondary | ICD-10-CM

## 2015-01-01 DIAGNOSIS — R42 Dizziness and giddiness: Secondary | ICD-10-CM

## 2015-01-01 NOTE — Therapy (Signed)
[place] MAIN [facility] SERVICES [address] [place], [place], [address] Phone: [phone]   Fax:  [phone]  Physical Therapy Treatment  Patient Details  Name: Lori Larsen MRN: [id number] Date of Birth: December 07, 1965 No Data Recorded  Encounter Date: 01/01/2015      PT End of Session - 01/01/15 1105    Visit Number 4   Number of Visits 7   Date for PT Re-Evaluation 01/19/15   Authorization Type no g codes   PT Start Time 06/28/1053   PT Stop Time 1130   PT Time Calculation (min) 35 min   Equipment Utilized During Treatment Gait belt   Activity Tolerance Patient tolerated treatment well   Behavior During Therapy [facility] for tasks assessed/performed      Past Medical History  Diagnosis Date  . Diabetes mellitus without complication ([place])   . Hypertension     Past Surgical History  Procedure Laterality Date  . Cesarean section      Filed Vitals:   01/01/15 1057  BP: 155/80  Pulse: 87    Visit Diagnosis:  Balance disorder  Dizziness and giddiness      Subjective Assessment - 01/01/15 1056    Subjective Pt denies any further episodes of dizziness since last therapy session. She report some GI upset this morning. Pt reports she is performing HEP daily without issue. No specific questions or concerns at this time. Pt reports that MD is waiting for patient to finish vestibular therapy prior to releasing back to work.    Pertinent History  Pt reports that she was at work and started having nausea and vomiting. Pt was having vertigo and fell at work needing help to stand up. Son brought patient to emergency room where she was diagnosed with vertigo. Head CT was negative at that time. Pt reports she had decreased hearing in R ear as well. She was referred to ENT where she saw Dr. Virgia Land. She was placed on a steroid which she reports helped a little. Symptoms gradually improved over time. Pt reports she had VNG study which showed "50%  loss." Pt unclear if it is loss of hearing or caloric deficit. Pt reports intermittent muffling in R ear which has persisted. Pt denies numbness/tingling in face, trunk or UE/LE. Pt reports some new onset RUE weakness since symptoms started.    Currently in Pain? No/denies       Neuromuscular Re-education:  Ascending/Descending Steps: Pt ambulated up/down 1 flight of stairs without UEs support one foot/step with performing alternate cone tapping and cognitive naming tasks. Pt required min A with dual tasks while ascending/descending steps activity, worse with descending activity.  VOR exercise: In NBOS on Airex pad, pt performed VOR X 1 horiz 1rep of 1 minute each, followed by VOR x 1 horizontal NBOS Airex with conflicting background 1 rep of 1 minute. Forward/retro ambulation VOR x 1 horizontal with forward/retro ambulation 35' x 2. Pt does not require cues today for amount of head excursion. Pt required CGA with one episode of minA+1 in static stance and one episode during retro ambulation. Pt denies dizziness and states she just has trouble with her balance.     Diona Foley toss to self: On Airex pad NBOS performed static ball toss to self vertical and horiz while turning head and eyes to follow ball. Then, worked on 73' trials of forward ambulation while doing ball toss to self vertical and to therapist horizontal.  Airex Semi-tandem progressions with horizontal and vertical  head turns alternating forward LE.   Ball toss over shoulder: Pt ambulated 61' trials while tossing ball over one shoulder with return catch over opposite shoulder at shoulder height.                            PT Education - 01/01/15 1427    Education provided Yes   Education Details HEP reinforced, not progressed on this date   Person(s) Educated Patient   Methods Explanation;Demonstration   Comprehension Verbalized understanding;Returned demonstration             PT Long Term Goals -  12/18/14 1102    PT LONG TERM GOAL #1   Title Pt will report independence with HEP for self-management of symptoms by 01/19/15   Status New   PT LONG TERM GOAL #2   Title Pt will demonstrate decrease in [place] by 18 points in order to demonstrate clinically significant reduction in symptoms and disability by 01/19/15   Baseline 12/05/14: 36/100   Status New   PT LONG TERM GOAL #3   Title Pt will report no further epsiodes of dizziness in order to resume driving and working without limitations by 01/19/15   Status New   PT LONG TERM GOAL #4   Title Pt will demonstrate improvement in single leg stance to >10 seconds in order to decrease fall risk by 01/19/15   Baseline 12/18/14: 4.5 seconds   Status New   PT LONG TERM GOAL #5   Title Pt will demonstrate ability to maintain balance with NBOS eyes closed on Airex pad for >10 seconds in order to decrease fall risk on complaint surfaces in low light by 01/19/15   Baseline 12/18/14: 3 seconds   Status New               Plan - 01/01/15 1106    Clinical Impression Statement Pt with GI upset during session today which limits activity and requires trip to bathroom which detracts from time for session. Unable to perform ambulation in crowded environment such as medical mall due to need for bathroom in close proximity. Pt demonstrates difficulty with ascending/descending stairs with cone taping and cognitive tasks. At next session will attempt progression to gaze scanning in crowded environment and progress stairs.  Pt encouraged to continue HEP and follow-up as scheduled.   Pt will benefit from skilled therapeutic intervention in order to improve on the following deficits Dizziness;Abnormal gait   Rehab Potential Excellent   Clinical Impairments Affecting Rehab Potential Positive: motivation, recent onset, Negative: age   PT Frequency 1x / week   PT Duration 6 weeks   PT Treatment/Interventions Canalith Repostioning;Gait training;Therapeutic  activities;Therapeutic exercise;Balance training;Neuromuscular re-education;Manual techniques;Vestibular   PT Next Visit Plan Consider ambulating in busy environment while doing head turns and scanning for targets to simulate patient's work environment, progress stair ambulation up/down steps with dual tasks. Progress VOR with forward/retro ambulation   PT Home Exercise Plan VOR x 1 horizontal 1 min x 3, 4 times/day NBOS, semi-tandem progression with eyes open/closed and horizontal head turns   Consulted and Agree with Plan of Care Patient        Problem List There are no active problems to display for this patient.  Phillips Grout PT, DPT   Kamauri Denardo 01/01/2015, 2:31 PM  [place] MAIN [facility] SERVICES [address] [place], [place], [address] Phone: [phone]   Fax:  [phone]  Name: Lori  CHIMENE Larsen MRN: [id number] Date of Birth: 1965-09-23

## 2015-01-11 ENCOUNTER — Ambulatory Visit: Payer: PRIVATE HEALTH INSURANCE | Attending: Otolaryngology

## 2015-01-11 ENCOUNTER — Encounter: Payer: Self-pay | Admitting: Physical Therapy

## 2015-01-11 DIAGNOSIS — R29818 Other symptoms and signs involving the nervous system: Secondary | ICD-10-CM | POA: Insufficient documentation

## 2015-01-11 DIAGNOSIS — R2689 Other abnormalities of gait and mobility: Secondary | ICD-10-CM

## 2015-01-11 DIAGNOSIS — R42 Dizziness and giddiness: Secondary | ICD-10-CM | POA: Insufficient documentation

## 2015-01-11 NOTE — Therapy (Signed)
[place] MAIN [facility] SERVICES [address] [place], [place], [address] Phone: [phone]   Fax:  [phone]  Physical Therapy Treatment  Patient Details  Name: Lori Larsen MRN: [id number] Date of Birth: [birth year] No Data Recorded  Encounter Date: 01/11/2015      PT End of Session - 01/11/15 1132    Visit Number 5   Number of Visits 7   Date for PT Re-Evaluation 01/19/15   Authorization Type no g codes   PT Start Time 1125   PT Stop Time 1155   PT Time Calculation (min) 30 min   Equipment Utilized During Treatment Gait belt   Activity Tolerance Patient tolerated treatment well   Behavior During Therapy [facility] for tasks assessed/performed      Past Medical History  Diagnosis Date  . Diabetes mellitus without complication ([place])   . Hypertension     Past Surgical History  Procedure Laterality Date  . Cesarean section      There were no vitals filed for this visit.  Visit Diagnosis:  Balance disorder  Dizziness and giddiness      Subjective Assessment - 01/11/15 1130    Subjective Pt continues to deny any further episodes of dizziness since last therapy session. She is performing HEP daily without issue. No specific questions or concerns at this time.    Pertinent History  Pt reports that she was at work and started having nausea and vomiting. Pt was having vertigo and fell at work needing help to stand up. Son brought patient to emergency room where she was diagnosed with vertigo. Head CT was negative at that time. Pt reports she had decreased hearing in R ear as well. She was referred to ENT where she saw Dr. Virgia Land. She was placed on a steroid which she reports helped a little. Symptoms gradually improved over time. Pt reports she had VNG study which showed "50% loss." Pt unclear if it is loss of hearing or caloric deficit. Pt reports intermittent muffling in R ear which has persisted. Pt denies numbness/tingling in face,  trunk or UE/LE. Pt reports some new onset RUE weakness since symptoms started.    Currently in Pain? No/denies         Neuromuscular Re-education:  Subjective obtained and plan of care discussed.   Ascending/Descending Steps: Pt ambulated up/down 1 flight of stairs without UEs support one foot/step with performing alternate cone tapping and cognitive naming tasks. Pt required min A with dual tasks while ascending/descending steps activity, worse with descending activity. Pt has one LOB for which she has to reach out and grab onto railing to steady herself. Denies dizziness   VOR exercise: Forward/retro ambulation VOR x 1 horizontal with forward/retro ambulation 35' x 3. Pt does not require cues today for amount of head excursion. Pt required CGA with mild lateral gait deviation, worse during retro ambulation. Denies dizziness.  Diona Foley toss to self: Extensive ambulation in hallway (at least 600') with horizontal and vertical head turns as well as vertical and horizontal ball tosses. Also included ball dribbling from side to side.   Crowded Ambulation Performed ambulation with verbal cues for horizontal, vertical and diagonal head turns with cues from therapist to locate objects in different directions;  Airex Balance beam forward/backward tandem gait; added horizontal head turns with forward tandem gait x 4 laps;  Balance beam side stepping with horizontal head turns x 4 laps;  PT Education - 01/11/15 1202    Education provided Yes   Education Details Reinforced HEP, plan of care   Person(s) Educated Patient   Methods Explanation;Demonstration   Comprehension Verbalized understanding;Returned demonstration             PT Long Term Goals - 12/18/14 1102    PT LONG TERM GOAL #1   Title Pt will report independence with HEP for self-management of symptoms by 01/19/15   Status New   PT LONG TERM GOAL #2   Title Pt will demonstrate  decrease in [place] by 18 points in order to demonstrate clinically significant reduction in symptoms and disability by 01/19/15   Baseline 12/05/14: 36/100   Status New   PT LONG TERM GOAL #3   Title Pt will report no further epsiodes of dizziness in order to resume driving and working without limitations by 01/19/15   Status New   PT LONG TERM GOAL #4   Title Pt will demonstrate improvement in single leg stance to >10 seconds in order to decrease fall risk by 01/19/15   Baseline 12/18/14: 4.5 seconds   Status New   PT LONG TERM GOAL #5   Title Pt will demonstrate ability to maintain balance with NBOS eyes closed on Airex pad for >10 seconds in order to decrease fall risk on complaint surfaces in low light by 01/19/15   Baseline 12/18/14: 3 seconds   Status New               Plan - 01/11/15 1133    Clinical Impression Statement Unfortunately pt arrived late for appointment so it was shortened accordingly. Unable to provoke dizziness with any activities today. Pt demonstrates some mild lateral gait deviations with head turns and does have difficulty with multiple cognitive tasks while performing stair ascend/descend. She does not struggle with ambulation in medical mall. Next session will repeat outcome measures and discharge patient. Pt is in agreement.    Pt will benefit from skilled therapeutic intervention in order to improve on the following deficits Dizziness;Abnormal gait   Rehab Potential Excellent   Clinical Impairments Affecting Rehab Potential Positive: motivation, recent onset, Negative: age   PT Frequency 1x / week   PT Duration 6 weeks   PT Treatment/Interventions Canalith Repostioning;Gait training;Therapeutic activities;Therapeutic exercise;Balance training;Neuromuscular re-education;Manual techniques;Vestibular   PT Next Visit Plan Repeat outcome measures, review HEP, discharge.   PT Home Exercise Plan VOR x 1 horizontal 1 min x 3, 4 times/day NBOS, semi-tandem  progression with eyes open/closed and horizontal head turns   Consulted and Agree with Plan of Care Patient        Problem List There are no active problems to display for this patient.  Phillips Grout PT, DPT   Huprich,Jason 01/11/2015, 12:06 PM  [place] MAIN [facility] SERVICES [address] [place], [place], [address] Phone: [phone]   Fax:  [phone]  Name: Lori Larsen MRN: [id number] Date of Birth: Jul 21, 1965

## 2015-01-18 ENCOUNTER — Ambulatory Visit: Payer: PRIVATE HEALTH INSURANCE | Admitting: Physical Therapy

## 2015-01-18 ENCOUNTER — Encounter: Payer: Self-pay | Admitting: Physical Therapy

## 2015-01-18 DIAGNOSIS — R29818 Other symptoms and signs involving the nervous system: Secondary | ICD-10-CM | POA: Diagnosis not present

## 2015-01-18 DIAGNOSIS — R42 Dizziness and giddiness: Secondary | ICD-10-CM

## 2015-01-18 DIAGNOSIS — R2689 Other abnormalities of gait and mobility: Secondary | ICD-10-CM

## 2015-01-18 NOTE — Therapy (Signed)
[place] MAIN [facility] SERVICES [address] [place], [place], [address] Phone: [phone]   Fax:  [phone]  Physical Therapy Treatment/Discharge Summary  Patient Details  Name: FLORIA BRANDAU MRN: [id number] Date of Birth: 06/14/1965 No Data Recorded  Encounter Date: 01/18/2015      PT End of Session - 01/18/15 1049    Visit Number 6   Number of Visits 7   Date for PT Re-Evaluation 01/19/15   Authorization Type no g codes   PT Start Time 1010   PT Stop Time 1036   PT Time Calculation (min) 26 min   Equipment Utilized During Treatment Gait belt   Activity Tolerance Patient tolerated treatment well   Behavior During Therapy WFL for tasks assessed/performed      Past Medical History  Diagnosis Date  . Diabetes mellitus without complication ([place])   . Hypertension     Past Surgical History  Procedure Laterality Date  . Cesarean section      There were no vitals filed for this visit.  Visit Diagnosis:  Balance disorder  Dizziness and giddiness      Subjective Assessment - 01/18/15 1013    Subjective Pt states she has not had any dizziness since last session. Pt states she has been doing her HEP. Pt states she is eager to go back to work.    Pertinent History  Pt reports that she was at work and started having nausea and vomiting. Pt was having vertigo and fell at work needing help to stand up. Son brought patient to emergency room where she was diagnosed with vertigo. Head CT was negative at that time. Pt reports she had decreased hearing in R ear as well. She was referred to ENT where she saw Dr. Virgia Land. She was placed on a steroid which she reports helped a little. Symptoms gradually improved over time. Pt reports she had VNG study which showed "50% loss." Pt unclear if it is loss of hearing or caloric deficit. Pt reports intermittent muffling in R ear which has persisted. Pt denies numbness/tingling in face, trunk or UE/LE.  Pt reports some new onset RUE weakness since symptoms started.       Neuromuscular Re-education: Pt completed DHI functional outcome measure. Performed multiple reps of SLS on firm surface with EO. L LE 51 and 35 seconds and R LE 15 and 29 sec.  Performed NBOS EC on Airex pad and was able to hold 1 minute and then performed EC on Airex pad with feet about 1" apart and pt was able to hold for 19 sec.  Reviewed and discussed HEP.  Discussed discharge plans and discussed community balance classes. Pt plans to continue performing her HEP and staying active. Pt reports that she has a copy of her HEP and does not have any questions about her exercise program.         PT Education - 01/18/15 1049    Education provided Yes   Education Details Reviewed HEP and discussed discharge plans. Discussed community balance programs.    Person(s) Educated Patient   Methods Explanation   Comprehension Verbalized understanding             PT Long Term Goals - 01/18/15 1052    PT LONG TERM GOAL #1   Title Pt will report independence with HEP for self-management of symptoms by 01/19/15   Status Achieved   PT LONG TERM GOAL #2   Title Pt will demonstrate decrease in [facility]  by 18 points in order to demonstrate clinically significant reduction in symptoms and disability by 01/19/15   Status Achieved   PT LONG TERM GOAL #3   Title Pt will report no further epsiodes of dizziness in order to resume driving and working without limitations by 01/19/15   Status Achieved   PT LONG TERM GOAL #4   Title Pt will demonstrate improvement in single leg stance to >10 seconds in order to decrease fall risk by 01/19/15   Status Achieved   PT LONG TERM GOAL #5   Title Pt will demonstrate ability to maintain balance with NBOS eyes closed on Airex pad for >10 seconds in order to decrease fall risk on complaint surfaces in low light by 01/19/15   Status Achieved               Plan - 01/18/15 1053    Clinical  Impression Statement Pt reports that she has not had any episodes of dizziness in over two weeks. Pt states she is happy with her progress and states she is eager to return to work. Pt scored 16 on the [facility] which is a low perception of handicap. Pt improved from SLS time of 4.5 seconds to R LE 29 sec and 15 sec and L LE 51 sec and 35 sec. Pt improved from 3 seconds for NBOS EC on Airex pad to greater than one minute with +1 sway. Pt has met all goals as set on POC and reports resolution of her dizziness symtpoms. Pt in agreement with discharge from PT services at this time.    Pt will benefit from skilled therapeutic intervention in order to improve on the following deficits Dizziness;Abnormal gait   Rehab Potential Excellent   Clinical Impairments Affecting Rehab Potential Positive: motivation, recent onset, Negative: age   PT Frequency 1x / week   PT Duration 6 weeks   PT Treatment/Interventions Canalith Repostioning;Gait training;Therapeutic activities;Therapeutic exercise;Balance training;Neuromuscular re-education;Manual techniques;Vestibular   PT Home Exercise Plan VOR x 1 horizontal 1 min x 3, 4 times/day NBOS, semi-tandem progression with eyes open/closed and horizontal head turns   Consulted and Agree with Plan of Care Patient        Problem List There are no active problems to display for this patient.  Lady Deutscher PT, DPT Karrissa Parchment 01/18/2015, 11:20 AM  [place] MAIN [facility] SERVICES [address] [place], [place], [address] Phone: [phone]   Fax:  [phone]  Name: ANAYLA GIANNETTI MRN: [id number] Date of Birth: 09-08-1965

## 2015-01-22 ENCOUNTER — Encounter: Payer: PRIVATE HEALTH INSURANCE | Admitting: Physical Therapy

## 2015-09-27 ENCOUNTER — Emergency Department
Admission: EM | Admit: 2015-09-27 | Discharge: 2015-09-27 | Disposition: A | Discharge status: Home / Self Care | Payer: 59 | Attending: Emergency Medicine | Admitting: Emergency Medicine

## 2015-09-27 DIAGNOSIS — F17210 Nicotine dependence, cigarettes, uncomplicated: Secondary | ICD-10-CM | POA: Diagnosis not present

## 2015-09-27 DIAGNOSIS — Z7982 Long term (current) use of aspirin: Secondary | ICD-10-CM | POA: Insufficient documentation

## 2015-09-27 DIAGNOSIS — R200 Anesthesia of skin: Secondary | ICD-10-CM | POA: Diagnosis present

## 2015-09-27 DIAGNOSIS — R202 Paresthesia of skin: Secondary | ICD-10-CM | POA: Insufficient documentation

## 2015-09-27 DIAGNOSIS — Z79899 Other long term (current) drug therapy: Secondary | ICD-10-CM | POA: Diagnosis not present

## 2015-09-27 DIAGNOSIS — E119 Type 2 diabetes mellitus without complications: Secondary | ICD-10-CM | POA: Insufficient documentation

## 2015-09-27 DIAGNOSIS — I10 Essential (primary) hypertension: Secondary | ICD-10-CM | POA: Diagnosis not present

## 2015-09-27 LAB — CBC
HCT: 35.7 % (ref 35.0–47.0)
HEMOGLOBIN: 12 g/dL (ref 12.0–16.0)
MCH: 28.1 pg (ref 26.0–34.0)
MCHC: 33.5 g/dL (ref 32.0–36.0)
MCV: 84 fL (ref 80.0–100.0)
PLATELETS: 321 10*3/uL (ref 150–440)
RBC: 4.25 MIL/uL (ref 3.80–5.20)
RDW: 15.7 % — ABNORMAL HIGH (ref 11.5–14.5)
WBC: 10.1 10*3/uL (ref 3.6–11.0)

## 2015-09-27 LAB — BASIC METABOLIC PANEL
ANION GAP: 7 (ref 5–15)
BUN: 6 mg/dL (ref 6–20)
CALCIUM: 9.3 mg/dL (ref 8.9–10.3)
CO2: 24 mmol/L (ref 22–32)
CREATININE: 0.51 mg/dL (ref 0.44–1.00)
Chloride: 104 mmol/L (ref 101–111)
Glucose, Bld: 179 mg/dL — ABNORMAL HIGH (ref 65–99)
Potassium: 3.5 mmol/L (ref 3.5–5.1)
SODIUM: 135 mmol/L (ref 135–145)

## 2015-09-27 LAB — TROPONIN I

## 2015-09-27 LAB — GLUCOSE, CAPILLARY: GLUCOSE-CAPILLARY: 166 mg/dL — AB (ref 65–99)

## 2015-09-27 MED ORDER — LISINOPRIL 10 MG PO TABS: 10.0000 mg | ORAL_TABLET | Freq: Every day | ORAL | Status: DC

## 2015-09-27 MED ORDER — GLIPIZIDE 5 MG PO TABS: 5.0000 mg | ORAL_TABLET | Freq: Every day | ORAL | Status: DC

## 2015-09-27 NOTE — ED Provider Notes (Signed)
[facility] Emergency Department Provider Note        Time seen: ----------------------------------------- 3:39 PM on 09/27/2015 -----------------------------------------    I have reviewed the triage vital signs and the nursing notes.   HISTORY  Chief Complaint Medication Refill and Numbness    HPI Lori Larsen is a 50 y.o. female who presents to ER for refill of her blood pressure medications. Patient states she plans to get in touch with her primary care doctor soon but does not have one currently. Patient had 2 separate areas of paresthesias in her right arm. She denies any other pain, weakness or other complaints. Patient states she stopped her medications a year ago because she was started taking them.   Past Medical History  Diagnosis Date  . Diabetes mellitus without complication ([place])   . Hypertension     There are no active problems to display for this patient.   Past Surgical History  Procedure Laterality Date  . Cesarean section      Allergies Review of patient's allergies indicates no known allergies.  Social History Social History  Substance Use Topics  . Smoking status: Current Some Day Smoker -- 0.50 packs/day for 20 years    Types: Cigarettes  . Smokeless tobacco: Never Used  . Alcohol Use: Yes     Comment: occassionally    Review of Systems Constitutional: Negative for fever. Cardiovascular: Negative for chest pain. Respiratory: Negative for shortness of breath. Gastrointestinal: Negative for abdominal pain, vomiting and diarrhea. Genitourinary: Negative for dysuria. Musculoskeletal: Negative for back pain. Skin: Negative for rash. Neurological: Negative for headaches,Positive for paresthesias  10-point ROS otherwise negative.  ____________________________________________   PHYSICAL EXAM:  VITAL SIGNS: ED Triage Vitals  Enc Vitals Group     BP 09/27/15 1422 179/108 mmHg     Pulse Rate 09/27/15 1422  75     Resp 09/27/15 1422 18     Temp 09/27/15 1422 98.6 F (37 C)     Temp Source 09/27/15 1422 Oral     SpO2 09/27/15 1422 100 %     Weight 09/27/15 1422 135 lb (61.236 kg)     Height --      Head Cir --      Peak Flow --      Pain Score --      Pain Loc --      Pain Edu? --      Excl. in [place]? --     Constitutional: Alert and oriented. Well appearing and in no distress. Eyes: Conjunctivae are normal. PERRL. Normal extraocular movements. Cardiovascular: Normal rate, regular rhythm. No murmurs, rubs, or gallops. Respiratory: Normal respiratory effort without tachypnea nor retractions. Breath sounds are clear and equal bilaterally. No wheezes/rales/rhonchi. Gastrointestinal: Soft and nontender. Normal bowel sounds Musculoskeletal: Nontender with normal range of motion in all extremities. No lower extremity tenderness nor edema. Neurologic:  Normal speech and language. No gross focal neurologic deficits are appreciated. Normal sensation of the right arm Skin:  Skin is warm, dry and intact. No rash noted. Psychiatric: Mood and affect are normal. Speech and behavior are normal.  ____________________________________________  EKG: Interpreted by me. Sinus rhythm rate of 77 bpm, normal PR interval, normal QRS, normal QT interval. Normal axis. Possible LVH.  ____________________________________________  ED COURSE:  Pertinent labs & imaging results that were available during my care of the patient were reviewed by me and considered in my medical decision making (see chart for details). Patient presents to ER with nonspecific  paresthesias and medication refill. I will restart her medications, advised close follow-up with her doctor. ____________________________________________    Reva Bores (pertinent positives/negatives)  Labs Reviewed  BASIC METABOLIC PANEL - Abnormal; Notable for the following:    Glucose, Bld 179 (*)    All other components within normal limits  CBC - Abnormal; Notable  for the following:    RDW 15.7 (*)    All other components within normal limits  GLUCOSE, CAPILLARY - Abnormal; Notable for the following:    Glucose-Capillary 166 (*)    All other components within normal limits  TROPONIN I  ____________________________________________  FINAL ASSESSMENT AND PLAN  Hypertension, paresthesias  Plan: Patient with asymptomatic hypertension, mild hyperglycemia. I will place her on glipizide and lisinopril. She could not tolerate metformin in the past. She is stable for discharge.   Earleen Newport, MD   Note: This dictation was prepared with Dragon dictation. Any transcriptional errors that result from this process are unintentional   Earleen Newport, MD 09/27/15 [id number]

## 2015-09-27 NOTE — ED Notes (Signed)
Pt arrives to ER from [place] in for evaluation of numbness to 2 separate places on right arm X 3 days. Pt denies pain. Pt states that she stopped taking her BP and diabetes medication almost 1 year ago and wants to get back started on it. Pt alert and oriented X4, active, cooperative, pt in NAD. RR even and unlabored, color WNL.

## 2015-09-27 NOTE — Discharge Instructions (Signed)
Hypertension Hypertension, commonly called high blood pressure, is when the force of blood pumping through your arteries is too strong. Your arteries are the blood vessels that carry blood from your heart throughout your body. A blood pressure reading consists of a higher number over a lower number, such as 110/72. The higher number (systolic) is the pressure inside your arteries when your heart pumps. The lower number (diastolic) is the pressure inside your arteries when your heart relaxes. Ideally you want your blood pressure below 120/80. Hypertension forces your heart to work harder to pump blood. Your arteries may become narrow or stiff. Having untreated or uncontrolled hypertension can cause heart attack, stroke, kidney disease, and other problems. RISK FACTORS Some risk factors for high blood pressure are controllable. Others are not.  Risk factors you cannot control include:   Race. You may be at higher risk if you are African American.  Age. Risk increases with age.  Gender. Men are at higher risk than women before age 45 years. After age 65, women are at higher risk than men. Risk factors you can control include:  Not getting enough exercise or physical activity.  Being overweight.  Getting too much fat, sugar, calories, or salt in your diet.  Drinking too much alcohol. SIGNS AND SYMPTOMS Hypertension does not usually cause signs or symptoms. Extremely high blood pressure (hypertensive crisis) may cause headache, anxiety, shortness of breath, and nosebleed. DIAGNOSIS To check if you have hypertension, your health care provider will measure your blood pressure while you are seated, with your arm held at the level of your heart. It should be measured at least twice using the same arm. Certain conditions can cause a difference in blood pressure between your right and left arms. A blood pressure reading that is higher than normal on one occasion does not mean that you need treatment. If  it is not clear whether you have high blood pressure, you may be asked to return on a different day to have your blood pressure checked again. Or, you may be asked to monitor your blood pressure at home for 1 or more weeks. TREATMENT Treating high blood pressure includes making lifestyle changes and possibly taking medicine. Living a healthy lifestyle can help lower high blood pressure. You may need to change some of your habits. Lifestyle changes may include:  Following the DASH diet. This diet is high in fruits, vegetables, and whole grains. It is low in salt, red meat, and added sugars.  Keep your sodium intake below 2,300 mg per day.  Getting at least 30-45 minutes of aerobic exercise at least 4 times per week.  Losing weight if necessary.  Not smoking.  Limiting alcoholic beverages.  Learning ways to reduce stress. Your health care provider may prescribe medicine if lifestyle changes are not enough to get your blood pressure under control, and if one of the following is true:  You are 18-59 years of age and your systolic blood pressure is above 140.  You are 60 years of age or older, and your systolic blood pressure is above 150.  Your diastolic blood pressure is above 90.  You have diabetes, and your systolic blood pressure is over 140 or your diastolic blood pressure is over 90.  You have kidney disease and your blood pressure is above 140/90.  You have heart disease and your blood pressure is above 140/90. Your personal target blood pressure may vary depending on your medical conditions, your age, and other factors. HOME CARE INSTRUCTIONS    Have your blood pressure rechecked as directed by your health care provider.   Take medicines only as directed by your health care provider. Follow the directions carefully. Blood pressure medicines must be taken as prescribed. The medicine does not work as well when you skip doses. Skipping doses also puts you at risk for  problems.  Do not smoke.   Monitor your blood pressure at home as directed by your health care provider. SEEK MEDICAL CARE IF:   You think you are having a reaction to medicines taken.  You have recurrent headaches or feel dizzy.  You have swelling in your ankles.  You have trouble with your vision. SEEK IMMEDIATE MEDICAL CARE IF:  You develop a severe headache or confusion.  You have unusual weakness, numbness, or feel faint.  You have severe chest or abdominal pain.  You vomit repeatedly.  You have trouble breathing. MAKE SURE YOU:   Understand these instructions.  Will watch your condition.  Will get help right away if you are not doing well or get worse.   This information is not intended to replace advice given to you by your health care provider. Make sure you discuss any questions you have with your health care provider.   Document Released: 02/24/2005 Document Revised: 07/11/2014 Document Reviewed: 12/17/2012 Elsevier Interactive Patient Education 2016 Elsevier Inc.  Paresthesia Paresthesia is a burning or prickling feeling. This feeling can happen in any part of the body. It often happens in the hands, arms, legs, or feet. Usually, it is not painful. In most cases, the feeling goes away in a short time and is not a sign of a serious problem. HOME CARE  Avoid drinking alcohol.  Try massage or needle therapy (acupuncture) to help with your problems.  Keep all follow-up visits as told by your doctor. This is important. GET HELP IF:  You keep on having episodes of paresthesia.  Your burning or prickling feeling gets worse when you walk.  You have pain or cramps.  You feel dizzy.  You have a rash. GET HELP RIGHT AWAY IF:  You feel weak.  You have trouble walking or moving.  You have problems speaking, understanding, or seeing.  You feel confused.  You cannot control when you pee (urinate) or poop (bowel movement).  You lose feeling  (numbness) after an injury.  You pass out (faint).   This information is not intended to replace advice given to you by your health care provider. Make sure you discuss any questions you have with your health care provider.   Document Released: 02/07/2008 Document Revised: 07/11/2014 Document Reviewed: 02/20/2014 Elsevier Interactive Patient Education [employer].

## 2015-09-27 NOTE — ED Notes (Signed)
MD at bedside. 

## 2015-09-27 NOTE — ED Notes (Signed)
Pt reports she has come to the ED to get refills on medications. Pt reports she has plans to get in touch with a PCP soon but does not have one currently.

## 2015-10-03 ENCOUNTER — Inpatient Hospital Stay: Payer: Managed Care, Other (non HMO)

## 2015-10-03 ENCOUNTER — Inpatient Hospital Stay
Admission: EM | Admit: 2015-10-03 | Discharge: 2015-10-04 | DRG: 066 | Disposition: A | Discharge status: Home Health | Payer: Managed Care, Other (non HMO) | Attending: Internal Medicine | Admitting: Internal Medicine

## 2015-10-03 ENCOUNTER — Observation Stay: Payer: Managed Care, Other (non HMO)

## 2015-10-03 ENCOUNTER — Encounter: Payer: Self-pay | Admitting: Emergency Medicine

## 2015-10-03 ENCOUNTER — Emergency Department: Payer: Managed Care, Other (non HMO)

## 2015-10-03 ENCOUNTER — Observation Stay (HOSPITAL_COMMUNITY)
Admit: 2015-10-03 | Discharge: 2015-10-03 | Disposition: A | Discharge status: Home / Self Care | Payer: Managed Care, Other (non HMO) | Attending: Internal Medicine | Admitting: Internal Medicine

## 2015-10-03 DIAGNOSIS — Z79899 Other long term (current) drug therapy: Secondary | ICD-10-CM

## 2015-10-03 DIAGNOSIS — I10 Essential (primary) hypertension: Secondary | ICD-10-CM | POA: Diagnosis present

## 2015-10-03 DIAGNOSIS — E119 Type 2 diabetes mellitus without complications: Secondary | ICD-10-CM | POA: Diagnosis present

## 2015-10-03 DIAGNOSIS — Z7982 Long term (current) use of aspirin: Secondary | ICD-10-CM | POA: Diagnosis not present

## 2015-10-03 DIAGNOSIS — Z7984 Long term (current) use of oral hypoglycemic drugs: Secondary | ICD-10-CM

## 2015-10-03 DIAGNOSIS — G459 Transient cerebral ischemic attack, unspecified: Secondary | ICD-10-CM

## 2015-10-03 DIAGNOSIS — E876 Hypokalemia: Secondary | ICD-10-CM | POA: Diagnosis present

## 2015-10-03 DIAGNOSIS — I639 Cerebral infarction, unspecified: Principal | ICD-10-CM

## 2015-10-03 DIAGNOSIS — R200 Anesthesia of skin: Secondary | ICD-10-CM | POA: Diagnosis present

## 2015-10-03 DIAGNOSIS — R202 Paresthesia of skin: Secondary | ICD-10-CM

## 2015-10-03 DIAGNOSIS — R29701 NIHSS score 1: Secondary | ICD-10-CM | POA: Diagnosis present

## 2015-10-03 DIAGNOSIS — F17210 Nicotine dependence, cigarettes, uncomplicated: Secondary | ICD-10-CM | POA: Diagnosis present

## 2015-10-03 LAB — GLUCOSE, CAPILLARY
GLUCOSE-CAPILLARY: 205 mg/dL — AB (ref 65–99)
GLUCOSE-CAPILLARY: 213 mg/dL — AB (ref 65–99)
GLUCOSE-CAPILLARY: 189 mg/dL — AB (ref 65–99)
Glucose-Capillary: 145 mg/dL — ABNORMAL HIGH (ref 65–99)

## 2015-10-03 LAB — LIPID PANEL
CHOL/HDL RATIO: 4.6 ratio
Cholesterol: 188 mg/dL (ref 0–200)
HDL: 41 mg/dL (ref >40)
LDL CALC: UNDETERMINED mg/dL (ref 0–99)
Triglycerides: 459 mg/dL — ABNORMAL HIGH (ref <150)
VLDL: UNDETERMINED mg/dL (ref 0–40)

## 2015-10-03 LAB — CBC
HEMATOCRIT: 32.9 % — AB (ref 35.0–47.0)
HEMOGLOBIN: 10.9 g/dL — AB (ref 12.0–16.0)
MCH: 28 pg (ref 26.0–34.0)
MCHC: 33.1 g/dL (ref 32.0–36.0)
MCV: 84.6 fL (ref 80.0–100.0)
Platelets: 299 10*3/uL (ref 150–440)
RBC: 3.88 MIL/uL (ref 3.80–5.20)
RDW: 16.2 % — ABNORMAL HIGH (ref 11.5–14.5)
WBC: 10.4 10*3/uL (ref 3.6–11.0)

## 2015-10-03 LAB — BASIC METABOLIC PANEL
ANION GAP: 6 (ref 5–15)
BUN: 8 mg/dL (ref 6–20)
CO2: 23 mmol/L (ref 22–32)
Calcium: 8.8 mg/dL — ABNORMAL LOW (ref 8.9–10.3)
Chloride: 106 mmol/L (ref 101–111)
Creatinine, Ser: 0.56 mg/dL (ref 0.44–1.00)
GFR calc Af Amer: >60 mL/min (ref >60)
GLUCOSE: 222 mg/dL — AB (ref 65–99)
POTASSIUM: 3.3 mmol/L — AB (ref 3.5–5.1)
SODIUM: 135 mmol/L (ref 135–145)

## 2015-10-03 LAB — TROPONIN I: Troponin I: <0.03 ng/mL (ref <0.03)

## 2015-10-03 MED ORDER — INSULIN ASPART 100 UNIT/ML ~~LOC~~ SOLN: 0.0000 [IU] | Freq: Every day | SUBCUTANEOUS | Status: DC

## 2015-10-03 MED ORDER — ENOXAPARIN SODIUM 40 MG/0.4ML ~~LOC~~ SOLN
40.0000 mg | SUBCUTANEOUS | Status: DC
  Administered 2015-10-03 – 2015-10-04 (×2): 40 mg via SUBCUTANEOUS
  Filled 2015-10-03 (×2): qty 0.4

## 2015-10-03 MED ORDER — ASPIRIN 81 MG PO CHEW
CHEWABLE_TABLET | ORAL | Status: AC
  Administered 2015-10-03: 324 mg via ORAL
  Filled 2015-10-03: qty 4

## 2015-10-03 MED ORDER — ASPIRIN 81 MG PO CHEW
324.0000 mg | CHEWABLE_TABLET | Freq: Once | ORAL | Status: AC
  Administered 2015-10-03: 324 mg via ORAL

## 2015-10-03 MED ORDER — NICOTINE 21 MG/24HR TD PT24
21.0000 mg | MEDICATED_PATCH | Freq: Every day | TRANSDERMAL | Status: DC
  Administered 2015-10-03 – 2015-10-04 (×2): 21 mg via TRANSDERMAL
  Filled 2015-10-03 (×2): qty 1

## 2015-10-03 MED ORDER — INSULIN ASPART 100 UNIT/ML ~~LOC~~ SOLN
0.0000 [IU] | Freq: Three times a day (TID) | SUBCUTANEOUS | Status: DC
  Administered 2015-10-03: 1 [IU] via SUBCUTANEOUS
  Administered 2015-10-03 – 2015-10-04 (×3): 3 [IU] via SUBCUTANEOUS
  Filled 2015-10-03 (×3): qty 3
  Filled 2015-10-03: qty 1

## 2015-10-03 MED ORDER — OXYCODONE HCL 5 MG PO TABS: 5.0000 mg | ORAL_TABLET | Freq: Four times a day (QID) | ORAL | Status: DC | PRN

## 2015-10-03 MED ORDER — POTASSIUM CHLORIDE 20 MEQ/15ML (10%) PO SOLN
40.0000 meq | Freq: Once | ORAL | Status: AC
  Administered 2015-10-03: 40 meq via ORAL
  Filled 2015-10-03: qty 30

## 2015-10-03 MED ORDER — ASPIRIN 325 MG PO TABS
325.0000 mg | ORAL_TABLET | Freq: Every day | ORAL | Status: DC
  Administered 2015-10-04: 325 mg via ORAL
  Filled 2015-10-03: qty 1

## 2015-10-03 MED ORDER — POTASSIUM CHLORIDE CRYS ER 20 MEQ PO TBCR
40.0000 meq | EXTENDED_RELEASE_TABLET | Freq: Once | ORAL | Status: AC
  Administered 2015-10-03: 40 meq via ORAL
  Filled 2015-10-03: qty 2

## 2015-10-03 MED ORDER — SENNOSIDES-DOCUSATE SODIUM 8.6-50 MG PO TABS: 1.0000 | ORAL_TABLET | Freq: Every evening | ORAL | Status: DC | PRN

## 2015-10-03 MED ORDER — MECLIZINE HCL 25 MG PO TABS: 25.0000 mg | ORAL_TABLET | Freq: Three times a day (TID) | ORAL | Status: DC | PRN

## 2015-10-03 MED ORDER — ASPIRIN EC 325 MG PO TBEC: 325.0000 mg | DELAYED_RELEASE_TABLET | Freq: Once | ORAL | Status: DC

## 2015-10-03 MED ORDER — HYDRALAZINE HCL 20 MG/ML IJ SOLN
10.0000 mg | INTRAMUSCULAR | Status: DC | PRN
  Administered 2015-10-03: 10 mg via INTRAVENOUS

## 2015-10-03 MED ORDER — HYDRALAZINE HCL 20 MG/ML IJ SOLN
INTRAMUSCULAR | Status: AC
  Administered 2015-10-03: 10 mg via INTRAVENOUS
  Filled 2015-10-03: qty 1

## 2015-10-03 MED ORDER — ASPIRIN 300 MG RE SUPP
300.0000 mg | Freq: Every day | RECTAL | Status: DC
  Filled 2015-10-03: qty 1

## 2015-10-03 MED ORDER — SODIUM CHLORIDE 0.9 % IV SOLN
INTRAVENOUS | Status: DC
  Administered 2015-10-03 (×2): via INTRAVENOUS

## 2015-10-03 MED ORDER — GLIPIZIDE 5 MG PO TABS
5.0000 mg | ORAL_TABLET | Freq: Every day | ORAL | Status: DC
  Administered 2015-10-03 – 2015-10-04 (×2): 5 mg via ORAL
  Filled 2015-10-03 (×2): qty 1

## 2015-10-03 MED ORDER — LISINOPRIL 10 MG PO TABS
10.0000 mg | ORAL_TABLET | Freq: Every day | ORAL | Status: DC
  Administered 2015-10-03: 10 mg via ORAL
  Filled 2015-10-03: qty 1

## 2015-10-03 MED ORDER — HYDRALAZINE HCL 20 MG/ML IJ SOLN
10.0000 mg | INTRAMUSCULAR | Status: DC | PRN
Start: 2015-10-03 — End: 2015-10-04
  Administered 2015-10-03: 10 mg via INTRAVENOUS
  Filled 2015-10-03: qty 1

## 2015-10-03 MED ORDER — STROKE: EARLY STAGES OF RECOVERY BOOK
Freq: Once | Status: AC
  Administered 2015-10-03: 11:00:00

## 2015-10-03 MED ORDER — ATORVASTATIN CALCIUM 20 MG PO TABS
40.0000 mg | ORAL_TABLET | Freq: Every day | ORAL | Status: DC
  Administered 2015-10-03: 18:00:00 40 mg via ORAL
  Filled 2015-10-03: qty 2

## 2015-10-03 NOTE — ED Provider Notes (Signed)
[facility] Emergency Department Provider Note  ____________________________________________  Time seen: 4:15 AM  I have reviewed the triage vital signs and the nursing notes.   HISTORY  Chief Complaint Numbness      HPI Lori Larsen is a 50 y.o. female with history of hypertension and diabetes presents emergency Department with right arm and leg and face numbness times approximately 6 days. Patient was seen in the emergency department on 09/27/2015 for right arm numbness and diagnosed with paresthesias at that time. Patient admits to mild headache denies any gait instability no weakness no change in vision, denies any nausea or vomiting.    Past Medical History:  Diagnosis Date  . Diabetes mellitus without complication ([place])   . Hypertension     There are no active problems to display for this patient.   Past Surgical History:  Procedure Laterality Date  . CESAREAN SECTION       Allergies No known drug allergies No family history on file.  Social History Social History  Substance Use Topics  . Smoking status: Current Some Day Smoker    Packs/day: 0.50    Years: 20.00    Types: Cigarettes  . Smokeless tobacco: Never Used  . Alcohol use Yes     Comment: occassionally    Review of Systems  Constitutional: Negative for fever. Eyes: Negative for visual changes. ENT: Negative for sore throat. Cardiovascular: Negative for chest pain. Respiratory: Negative for shortness of breath. Gastrointestinal: Negative for abdominal pain, vomiting and diarrhea. Genitourinary: Negative for dysuria. Musculoskeletal: Negative for back pain. Skin: Negative for rash. Neurological: Positive for headache and right face arm leg numbness   10-point ROS otherwise negative.  ____________________________________________   PHYSICAL EXAM:  VITAL SIGNS: ED Triage Vitals  Enc Vitals Group     BP 10/03/15 0050 (!) 201/95     Pulse Rate 10/03/15  0050 (!) 101     Resp 10/03/15 0050 20     Temp 10/03/15 0050 98 F (36.7 C)     Temp Source 10/03/15 0050 Oral     SpO2 10/03/15 0050 99 %     Weight 10/03/15 0050 134 lb (60.8 kg)     Height 10/03/15 0050 5' (1.524 m)     Head Circumference --      Peak Flow --      Pain Score 10/03/15 0318 0     Pain Loc --      Pain Edu? --      Excl. in [place]? --      Constitutional: Alert and oriented. Well appearing and in no distress. Eyes: Conjunctivae are normal. PERRL. Normal extraocular movements. ENT   Head: Normocephalic and atraumatic.   Nose: No congestion/rhinnorhea.   Mouth/Throat: Mucous membranes are moist.   Neck: No stridor. Hematological/Lymphatic/Immunilogical: No cervical lymphadenopathy. Cardiovascular: Normal rate, regular rhythm. Normal and symmetric distal pulses are present in all extremities. No murmurs, rubs, or gallops. Respiratory: Normal respiratory effort without tachypnea nor retractions. Breath sounds are clear and equal bilaterally. No wheezes/rales/rhonchi. Gastrointestinal: Soft and nontender. No distention. There is no CVA tenderness. Genitourinary: deferred Musculoskeletal: Nontender with normal range of motion in all extremities. No joint effusions.  No lower extremity tenderness nor edema. Neurologic:  Normal speech and language. Right facial arm and leg numbness in comparison to left  Skin:  Skin is warm, dry and intact. No rash noted. Psychiatric: Mood and affect are normal. Speech and behavior are normal. Patient exhibits appropriate insight and judgment.  ____________________________________________    LABS (pertinent positives/negatives)  Labs Reviewed  BASIC METABOLIC PANEL - Abnormal; Notable for the following:       Result Value   Potassium 3.3 (*)    Glucose, Bld 222 (*)    Calcium 8.8 (*)    All other components within normal limits  CBC - Abnormal; Notable for the following:    Hemoglobin 10.9 (*)    HCT 32.9 (*)    RDW  16.2 (*)    All other components within normal limits  TROPONIN I     ____________________________________________   EKG  ED ECG REPORT I, Haviland N Laaibah Wartman, the attending physician, personally viewed and interpreted this ECG.   Date: 10/03/2015  EKG Time: 12:59 AM  Rate: 85  Rhythm: Normal sinus rhythm  Axis: Normal  Intervals: Normal  ST&T Change: None     RADIOLOGY  CLINICAL DATA:  50 year old female with unilateral numbness. Numbness to the right side of the body for the past few days. EXAM: CT HEAD WITHOUT CONTRAST TECHNIQUE: Contiguous axial images were obtained from the base of the skull through the vertex without intravenous contrast. COMPARISON:  CT dated 10/26/2014 FINDINGS: The ventricles and sulci are appropriate in size for patient's age. Minimal periventricular and deep white matter chronic microvascular ischemic changes noted. There is a 1.9 x 1.7 cm area of hypodensity in the right cerebellar hemisphere, which is new from prior study and compatible with an area of infarct, likely subacute or chronic. A subcentimeter low attenuating focus in the right cerebellar hemisphere also represents an infarct, likely subacute or chronic. Correlation with clinical exam is recommended. MRI may provide better evaluation if there is high clinical concern for acute ischemia. There is no acute intracranial hemorrhage. No mass effect or midline shift. The visualized paranasal sinuses and mastoid air cells are clear. The calvarium is intact. IMPRESSION: No acute intracranial hemorrhage. 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 Clinical correlation is recommended. MRI may provide better evaluation if there is clinical concern for acute ischemia. Electronically Signed   By: Anner Crete M.D.   On: 10/03/2015 04:38   Procedures     INITIAL IMPRESSION / ASSESSMENT AND PLAN / ED COURSE  Pertinent labs & imaging results  that were available during my care of the patient were reviewed by me and considered in my medical decision making (see chart for details).  History physical exam CT scan findings consistent with cerebrovascular accident. Patient discussed with Dr.Pyreddy for hospital admission for further evaluation and management  ____________________________________________   FINAL CLINICAL IMPRESSION(S) / ED DIAGNOSES  Final diagnoses:  Cerebral infarction due to unspecified mechanism      Gregor Hams, MD 10/03/15 [phone]

## 2015-10-03 NOTE — Evaluation (Addendum)
Occupational Therapy Evaluation Patient Details Name: Lori Larsen MRN: [id number] DOB: [birth year] Today's Date: 10/03/2015    History of Present Illness Lori Larsen is an 50 y.o. female with a history of DM And HTN who reports that some time last week she began to experience some areas of numbness on her right arm.  She had been out of medications and presented to the ED for refills.  Patient then over the weekend experienced worsening of the numbness to involve her entire right side of her body and face.  No weakness has been noted.  Patient presented again for evaluation.  MRI indicated chronic infarct in R cerebellum and L medical thalamus extending into midbrain. Pt is a smoker with hx of DM and not compliant with medications for DM or HTN.   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  Pt presents with a flat affect and tearful with a mild delay in responses but cooperative.  Pt has decreased fine motor control of R hand as well as numbness throughout RUE and RLE.  She has intact proprioception but patchy areas of decreased light touch on inside aspect of both forearms.  Pt is able to complete automatic tasks but tasks requiring planning are more difficult.  She had difficulty sequencing how to bring foot rest of recliner down.  Pt needed min assist for LB dressing sitting in recliner with good sitting balance but decreased for higher level balance tasks.  Pt is able to feed herself and perform hygiene tasks with extra time and cues.  Pt has full AROM of BUEs and hands but R grasp is weaker than L.  Coordination is intact but slow and requires extra time. Rec continued OT while in hospital to continue to work on increasing independence in ADLs, balance and functional mobility training, coordination exercises and family ed and training.  She would be an excellent  candidate for CIR to return to PLOF.      Follow Up Recommendations  CIR    Equipment Recommendations       Recommendations for Other Services       Precautions / Restrictions Precautions Precautions: Fall Restrictions Weight Bearing Restrictions: No      Mobility Bed Mobility                  Transfers                      Balance                                            ADL Overall ADL's : Needs assistance/impaired Eating/Feeding: Independent;Set up   Grooming: Wash/dry hands;Wash/dry face;Oral care;Applying deodorant;Brushing hair;Set up   Upper Body Bathing: Set up;Minimal assitance Upper Body Bathing Details (indicate cue type and reason): movements very slow and needs extra time for tasks.     Upper Body Dressing : Minimal assistance;Set up;Sitting Upper Body Dressing Details (indicate cue type and reason): decreased coordination for tying and small fine motor tasks using R hand Lower Body Dressing: Minimal assistance;Set up;Supervision/safety;Sit to/from stand Lower Body Dressing Details (indicate cue type and reason): higher level balance issues as well as problem solving for dynamic balance to prevent falls  Functional mobility during ADLs: Minimal assistance General ADL Comments: slow small steps during ambulation using FWW with PT prior to OT session with min assist and cues.  Good sitting balance but decreased when leaning forward in chair and standing on one foot.     Vision     Perception     Praxis      Pertinent Vitals/Pain Pain Assessment: No/denies pain     Hand Dominance Right   Extremity/Trunk Assessment Upper Extremity Assessment Upper Extremity Assessment: RUE deficits/detail;LUE deficits/detail RUE Deficits / Details: decreased coordination in R hand for rapid alternating tasks, intact proproioception and patchy areas of R and L forearm with decreased sensation near elbow ventral  aspect only RUE Sensation: decreased light touch RUE Coordination: decreased fine motor LUE Deficits / Details: decreased sensation to light touch only inside L elbow about 3 inches below elbow   Lower Extremity Assessment Lower Extremity Assessment: Defer to PT evaluation       Communication Communication Communication: No difficulties   Cognition Arousal/Alertness: Awake/alert Behavior During Therapy: Flat affect (tearful ) Overall Cognitive Status: Within Functional Limits for tasks assessed                     General Comments       Exercises       Shoulder Instructions      Home Living Family/patient expects to be discharged to:: Private residence Living Arrangements: Children;Spouse/significant other Available Help at Discharge:  (mom and sister live in [place] ) Type of Home: House Home Access: Stairs to enter Technical brewer of Steps: 3 Entrance Stairs-Rails: None Home Layout: One level     Bathroom Shower/Tub: Tub/shower unit Shower/tub characteristics: Architectural technologist: Programmer, systems: No   Home Equipment: None          Prior Functioning/Environment Level of Independence: Independent        Comments: pt was working and did Firefighter for a living and has a 2 year old son and 59 year old son    OT Diagnosis: Other (comment) (decreased fine motor control R hand with numbness and decreased sensation inside of R and L elbow)   OT Problem List: Decreased activity tolerance;Impaired sensation;Decreased safety awareness;Impaired balance (sitting and/or standing)   OT Treatment/Interventions: Self-care/ADL training;Therapeutic activities;Balance training;Neuromuscular education;Patient/family education    OT Goals(Current goals can be found in the care plan section) Acute Rehab OT Goals Patient Stated Goal: "to get back to work" OT Goal Formulation: With patient/family Time For Goal Achievement:  10/17/15 Potential to Achieve Goals: Good ADL Goals Pt Will Perform Lower Body Dressing: with set-up;with supervision;sit to/from stand Pt Will Transfer to Toilet: with set-up;with supervision;regular height toilet;stand pivot transfer (with no LOB) Pt Will Perform Toileting - Clothing Manipulation and hygiene: with supervision;sit to/from stand (with no LOB using R hand)  OT Frequency:  (rec OT as many days as possible each week)   Barriers to D/C:            Co-evaluation              End of Session    Activity Tolerance: Patient tolerated treatment well Patient left: in chair;with call bell/phone within reach;with chair alarm set   Time: 1455-1533 OT Time Calculation (min): 38 min Charges:  OT General Charges $OT Visit: 1 Procedure OT Evaluation $OT Eval High Complexity: 1 Procedure OT Treatments $Self Care/Home Management : 8-22 mins $Therapeutic Activity: 8-22 mins G-Codes:     Chrys Racer, OTR/L ascom  [phone] 10/03/2015, 3:43 PM

## 2015-10-03 NOTE — Progress Notes (Addendum)
[place] [facility] [place] NAME: Lori Larsen    MR#:  [id number]  DATE OF BIRTH:  07-Feb-1966  SUBJECTIVE:   Patient here with numbness of her face and leg. No other neurological deficits.  REVIEW OF SYSTEMS:    Review of Systems  Constitutional: Negative.  Negative for chills, fever and malaise/fatigue.  HENT: Negative.  Negative for ear discharge, ear pain, hearing loss, nosebleeds and sore throat.   Eyes: Negative.  Negative for blurred vision and pain.  Respiratory: Negative.  Negative for cough, hemoptysis, shortness of breath and wheezing.   Cardiovascular: Negative.  Negative for chest pain, palpitations and leg swelling.  Gastrointestinal: Negative.  Negative for abdominal pain, blood in stool, diarrhea, nausea and vomiting.  Genitourinary: Negative.  Negative for dysuria.  Musculoskeletal: Negative.  Negative for back pain.  Skin: Negative.   Neurological: Positive for sensory change. Negative for dizziness, tremors, speech change, focal weakness, seizures and headaches.  Endo/Heme/Allergies: Negative.  Does not bruise/bleed easily.  Psychiatric/Behavioral: Negative.  Negative for depression, hallucinations and suicidal ideas.    Tolerating Diet: yes      DRUG ALLERGIES:  No Known Allergies  VITALS:  Blood pressure (!) 152/80, pulse 82, temperature 98 F (36.7 C), temperature source Oral, resp. rate 20, height 4\' 11"  (1.499 m), weight 62.7 kg (138 lb 4.8 oz), last menstrual period 08/12/2015, SpO2 100 %.  PHYSICAL EXAMINATION:   Physical Exam  Constitutional: She is oriented to person, place, and time and well-developed, well-nourished, and in no distress. No distress.  HENT:  Head: Normocephalic.  Eyes: No scleral icterus.  Neck: Normal range of motion. Neck supple. No JVD present. No tracheal deviation present.  Cardiovascular: Normal rate, regular rhythm and normal heart sounds.  Exam reveals no gallop and no friction  rub.   No murmur heard. Pulmonary/Chest: Effort normal and breath sounds normal. No respiratory distress. She has no wheezes. She has no rales. She exhibits no tenderness.  Abdominal: Soft. Bowel sounds are normal. She exhibits no distension and no mass. There is no tenderness. There is no rebound and no guarding.  Musculoskeletal: Normal range of motion. She exhibits no edema.  Neurological: She is alert and oriented to person, place, and time. She displays normal reflexes. No cranial nerve deficit. She exhibits abnormal muscle tone. Coordination normal.  Decreased sensation right face and right leg  Skin: Skin is warm. No rash noted. No erythema.  Psychiatric: Affect and judgment normal.      LABORATORY PANEL:   CBC  Recent Labs Lab 10/03/15 0100  WBC 10.4  HGB 10.9*  HCT 32.9*  PLT 299   ------------------------------------------------------------------------------------------------------------------  Chemistries   Recent Labs Lab 10/03/15 0100  NA 135  K 3.3*  CL 106  CO2 23  GLUCOSE 222*  BUN 8  CREATININE 0.56  CALCIUM 8.8*   ------------------------------------------------------------------------------------------------------------------  Cardiac Enzymes  Recent Labs Lab 09/27/15 1424 10/03/15 0100  TROPONINI <0.03 <0.03   ------------------------------------------------------------------------------------------------------------------  RADIOLOGY:  Ct Head Wo Contrast  Result Date: 10/03/2015 CLINICAL DATA:  50 year old female with unilateral numbness. Numbness to the right side of the body for the past few days. EXAM: CT HEAD WITHOUT CONTRAST TECHNIQUE: Contiguous axial images were obtained from the base of the skull through the vertex without intravenous contrast. COMPARISON:  CT dated 10/26/2014 FINDINGS: The ventricles and sulci are appropriate in size for patient's age. Minimal periventricular and deep white matter chronic microvascular ischemic  changes noted. There is a 1.9 x  1.7 cm area of hypodensity in the right cerebellar hemisphere, which is new from prior study and compatible with an area of infarct, likely subacute or chronic. A subcentimeter low attenuating focus in the right cerebellar hemisphere also represents an infarct, likely subacute or chronic. Correlation with clinical exam is recommended. MRI may provide better evaluation if there is high clinical concern for acute ischemia. There is no acute intracranial hemorrhage. No mass effect or midline shift. The visualized paranasal sinuses and mastoid air cells are clear. The calvarium is intact. IMPRESSION: No acute intracranial hemorrhage. 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 Clinical correlation is recommended. MRI may provide better evaluation if there is clinical concern for acute ischemia. Electronically Signed   By: Anner Crete M.D.   On: 10/03/2015 04:38    ASSESSMENT AND PLAN:   50 year old female with a history of diabetes and essential hypertension who presents with right leg and right sided face numbness.  1. Right cerebellar infarct on CT scan: Follow-up on MRI/MRA Carotid Doppler and echocardiogram. Continue aspirin and start statin. Check lipid panel. Neuro checks. Neurology consult pending. Order PT and OT consult.  2. Diabetes: Continue sliding scale insulin, ADA diet and glipizide.  3. Essential hypertension: Hold hypertensive medications to allow perfusion due to problem 1.   4. Tobacco dependence: patient counselled for 3 minutes To stop smoking. She is highly advised and encouraged to do so.  Management plans discussed with the patient and she is in agreement.  CODE STATUS: FULL  TOTAL TIME TAKING CARE OF THIS PATIENT: 30 minutes.     POSSIBLE D/C tomorrow, DEPENDING ON CLINICAL CONDITION.   Gertha Lichtenberg M.D on 10/03/2015 at 8:52 AM  Between 7am to 6pm - Pager - [phone] After 6pm go to  [email or web address] - password [email or web address] [email or web address]  Office  [phone]  CC: Primary care physician; No PCP Per Patient  Note: This dictation was prepared with Dragon dictation along with smaller phrase technology. Any transcriptional errors that result from this process are unintentional.

## 2015-10-03 NOTE — H&P (Signed)
[facility] [place] at [facility]   PATIENT NAME: Lori Larsen    MR#:  [id number]  DATE OF BIRTH:  Dec 18, 1965  DATE OF ADMISSION:  10/03/2015  PRIMARY CARE PHYSICIAN: No PCP Per Patient   REQUESTING/REFERRING PHYSICIAN:   CHIEF COMPLAINT:   Chief Complaint  Patient presents with  . Numbness    HISTORY OF PRESENT ILLNESS: Lori Larsen  is a 50 y.o. female with a known history of Diabetes mellitus type 2, hypertension presented to the emergency room with numbness in the right arm, right leg and right side of the face since last 1 week. Patient presented with above complaints on 20th of July 2 emergency room and she was discharged home. The symptoms persisted and she came today to the emergency room and she was worked up with a CT head which showed subacute CVA in the cerebellum on the right side. No history of any slurred speech. No complaints of any difficulty swallowing food. Patient never had any stroke in the past. Patient presented to the emergency room her blood pressure was elevated. She has been again started back on her blood pressure medication 1 week ago and prior to that she ran out of medication for blood pressure. No complaints of chest pain. No complaints of any shortness of breath. Hospitalist service was consulted for further care of the patient.  PAST MEDICAL HISTORY:   Past Medical History:  Diagnosis Date  . Diabetes mellitus without complication (HCC)   . Hypertension     PAST SURGICAL HISTORY: Past Surgical History:  Procedure Laterality Date  . CESAREAN SECTION      SOCIAL HISTORY:  Social History  Substance Use Topics  . Smoking status: Current Some Day Smoker    Packs/day: 0.50    Years: 20.00    Types: Cigarettes  . Smokeless tobacco: Never Used  . Alcohol use Yes     Comment:  occasionally drinks beer    FAMILY HISTORY:  Family History  Problem Relation Age of Onset  . Hypertension Mother   . Hypertension  Father     DRUG ALLERGIES: No Known Allergies  REVIEW OF SYSTEMS:   CONSTITUTIONAL: No fever, no weakness.  EYES: No blurred or double vision.  EARS, NOSE, AND THROAT: No tinnitus or ear pain.  RESPIRATORY: No cough, shortness of breath, wheezing or hemoptysis.  CARDIOVASCULAR: No chest pain, orthopnea, edema.  GASTROINTESTINAL: No nausea, vomiting, diarrhea or abdominal pain.  GENITOURINARY: No dysuria, hematuria.  ENDOCRINE: No polyuria, nocturia,  HEMATOLOGY: No anemia, easy bruising or bleeding SKIN: No rash or lesion. MUSCULOSKELETAL: No joint pain or arthritis.   NEUROLOGIC: No tingling,has numbness right arm, right leg and right side of face. PSYCHIATRY: No anxiety or depression.   MEDICATIONS AT HOME:  Prior to Admission medications   Medication Sig Start Date End Date Taking? Authorizing Provider  aspirin EC 81 MG tablet Take 81 mg by mouth daily.    Historical Provider, MD  diazepam (VALIUM) 5 MG tablet Take 1 tablet (5 mg total) by mouth every 8 (eight) hours as needed (to be taken with meclizine for vertigo). 10/26/14   Emily Filbert, MD  glipiZIDE (GLUCOTROL) 5 MG tablet Take by mouth daily before breakfast.    Historical Provider, MD  glipiZIDE (GLUCOTROL) 5 MG tablet Take 1 tablet (5 mg total) by mouth daily. 09/27/15 09/26/16  Emily Filbert, MD  ibuprofen (ADVIL,MOTRIN) 600 MG tablet Take 1 tablet (600 mg total) by mouth every 6 (six)  hours as needed for moderate pain. 08/04/14   Joanne Gavel, MD  lisinopril (PRINIVIL,ZESTRIL) 10 MG tablet Take 10 mg by mouth daily.    Historical Provider, MD  lisinopril (PRINIVIL,ZESTRIL) 10 MG tablet Take 1 tablet (10 mg total) by mouth daily. 09/27/15 09/26/16  Earleen Newport, MD  meclizine (ANTIVERT) 25 MG tablet Take 1 tablet (25 mg total) by mouth 3 (three) times daily as needed for dizziness or nausea. 10/26/14   Earleen Newport, MD  oxyCODONE (ROXICODONE) 5 MG immediate release tablet Take 1 tablet (5 mg total)  by mouth every 6 (six) hours as needed for moderate pain. Do not drive while taking this medication. Patient not taking: Reported on 12/05/2014 08/04/14   Joanne Gavel, MD  pioglitazone (ACTOS) 30 MG tablet Take 30 mg by mouth daily.    Historical Provider, MD      PHYSICAL EXAMINATION:   VITAL SIGNS: Blood pressure (!) 213/100, pulse 79, temperature 98.2 F (36.8 C), resp. rate 15, height 5' (1.524 m), weight 60.8 kg (134 lb), last menstrual period 08/12/2015, SpO2 100 %.  GENERAL:  50 y.o.-year-old patient lying in the bed with no acute distress.  EYES: Pupils equal, round, reactive to light and accommodation. No scleral icterus. Extraocular muscles intact.  HEENT: Head atraumatic, normocephalic. Oropharynx and nasopharynx clear.  NECK:  Supple, no jugular venous distention. No thyroid enlargement, no tenderness.  LUNGS: Normal breath sounds bilaterally, no wheezing, rales,rhonchi or crepitation. No use of accessory muscles of respiration.  CARDIOVASCULAR: S1, S2 normal. No murmurs, rubs, or gallops.  ABDOMEN: Soft, nontender, nondistended. Bowel sounds present. No organomegaly or mass.  EXTREMITIES: No pedal edema, cyanosis, or clubbing.  NEUROLOGIC: Cranial nerves II through XII are intact. Muscle strength 5/5 in all extremities. Sensation intact. Gait normal. Numbness right leg, right arm and right side of face. PSYCHIATRIC: The patient is alert and oriented x 3.  SKIN: No obvious rash, lesion, or ulcer.   LABORATORY PANEL:   CBC  Recent Labs Lab 09/27/15 1424 10/03/15 0100  WBC 10.1 10.4  HGB 12.0 10.9*  HCT 35.7 32.9*  PLT 321 299  MCV 84.0 84.6  MCH 28.1 28.0  MCHC 33.5 33.1  RDW 15.7* 16.2*   ------------------------------------------------------------------------------------------------------------------  Chemistries   Recent Labs Lab 09/27/15 1424 10/03/15 0100  NA 135 135  K 3.5 3.3*  CL 104 106  CO2 24 23  GLUCOSE 179* 222*  BUN 6 8  CREATININE 0.51  0.56  CALCIUM 9.3 8.8*   ------------------------------------------------------------------------------------------------------------------ estimated creatinine clearance is 68.5 mL/min (by C-G formula based on SCr of 0.8 mg/dL). ------------------------------------------------------------------------------------------------------------------ No results for input(s): TSH, T4TOTAL, T3FREE, THYROIDAB in the last 72 hours.  Invalid input(s): FREET3   Coagulation profile No results for input(s): INR, PROTIME in the last 168 hours. ------------------------------------------------------------------------------------------------------------------- No results for input(s): DDIMER in the last 72 hours. -------------------------------------------------------------------------------------------------------------------  Cardiac Enzymes  Recent Labs Lab 09/27/15 1424 10/03/15 0100  TROPONINI <0.03 <0.03   ------------------------------------------------------------------------------------------------------------------ Invalid input(s): POCBNP  ---------------------------------------------------------------------------------------------------------------  Urinalysis    Component Value Date/Time   COLORURINE STRAW (A) 08/04/2014 0918   APPEARANCEUR CLEAR (A) 08/04/2014 0918   APPEARANCEUR Clear 01/11/2012 2018   LABSPEC 1.012 08/04/2014 0918   LABSPEC 1.031 01/11/2012 2018   PHURINE 7.0 08/04/2014 0918   GLUCOSEU >500 (A) 08/04/2014 0918   GLUCOSEU >=500 01/11/2012 2018   HGBUR NEGATIVE 08/04/2014 [place] NEGATIVE 08/04/2014 0918   BILIRUBINUR Negative 01/11/2012 2018   KETONESUR 1+ (A) 08/04/2014 [id number]  PROTEINUR NEGATIVE 08/04/2014 0918   NITRITE NEGATIVE 08/04/2014 0918   LEUKOCYTESUR NEGATIVE 08/04/2014 0918   LEUKOCYTESUR Negative 01/11/2012 2018     RADIOLOGY: Ct Head Wo Contrast  Result Date: 10/03/2015 CLINICAL DATA:  50 year old female with unilateral  numbness. Numbness to the right side of the body for the past few days. EXAM: CT HEAD WITHOUT CONTRAST TECHNIQUE: Contiguous axial images were obtained from the base of the skull through the vertex without intravenous contrast. COMPARISON:  CT dated 10/26/2014 FINDINGS: The ventricles and sulci are appropriate in size for patient's age. Minimal periventricular and deep white matter chronic microvascular ischemic changes noted. There is a 1.9 x 1.7 cm area of hypodensity in the right cerebellar hemisphere, which is new from prior study and compatible with an area of infarct, likely subacute or chronic. A subcentimeter low attenuating focus in the right cerebellar hemisphere also represents an infarct, likely subacute or chronic. Correlation with clinical exam is recommended. MRI may provide better evaluation if there is high clinical concern for acute ischemia. There is no acute intracranial hemorrhage. No mass effect or midline shift. The visualized paranasal sinuses and mastoid air cells are clear. The calvarium is intact. IMPRESSION: No acute intracranial hemorrhage. 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 Clinical correlation is recommended. MRI may provide better evaluation if there is clinical concern for acute ischemia. Electronically Signed   By: Anner Crete M.D.   On: 10/03/2015 04:38   EKG: Orders placed or performed during the hospital encounter of 10/03/15  . ED EKG within 10 minutes  . ED EKG within 10 minutes  . EKG 12-Lead  . EKG 12-Lead    IMPRESSION AND PLAN: 50 year old female patient with history of type 2 diabetes mellitus and hypertension presented to the emergency room with numbness of the right arm and leg and right side of the face. Admitting diagnosis 1. Subacute CVA right cerebellum 2. Uncontrolled hypertension 3. Hypokalemia 4. Type 2 diabetes mellitus Treatment plan Admit patient to telemetry Replace potassium Aspirin 325 mg  daily DVT prophylaxis subcutaneous Lovenox 40 MG daily MRI MRA brain to assess for CVA Carotid ultrasound to rule out obstruction Check echocardiogram Control blood pressure with oral lisinopril and when necessary hydralazine as needed Supportive care Neurology consultation.  All the records are reviewed and case discussed with ED provider. Management plans discussed with the patient, family and they are in agreement.  CODE STATUS:FULL Code Status History    This patient does not have a recorded code status. Please follow your organizational policy for patients in this situation.       TOTAL TIME TAKING CARE OF THIS PATIENT: 55 minutes.    Saundra Shelling M.D on 10/03/2015 at 6:10 AM  Between 7am to 6pm - Pager - [phone]  After 6pm go to [email or web address] - password [email or web address] [facility]  [facility]  Office  [phone]  CC: Primary care physician; No PCP Per Patient

## 2015-10-03 NOTE — Consult Note (Signed)
Referring Physician: Mody    Chief Complaint: Right sided numbness  HPI: AZENET DONATI is an 50 y.o. female with a history of DM And HTN who reports that some time last week she began to experience some areas of numbness on her right arm.  She had been out of medications and presented to the ED for refills.  Patient then over the weekend experienced worsening of the numbness to involve her entire right side of her body and face.  No weakness has been noted.  Patient presented again for evaluation.   Initial NIHSS of 1.  Date last known well: Unable to determine Time last known well: Unable to determine tPA Given: No: Outside time window, unable to determine LKW  Past Medical History:  Diagnosis Date  . Diabetes mellitus without complication ([place])   . Hypertension     Past Surgical History:  Procedure Laterality Date  . CESAREAN SECTION      Family History  Problem Relation Age of Onset  . Hypertension Mother   . Hypertension Father    Social History:  reports that she has been smoking Cigarettes.  She has a 10.00 pack-year smoking history. She has never used smokeless tobacco. She reports that she drinks alcohol. She reports that she does not use drugs.  Allergies: No Known Allergies  Medications:  I have reviewed the patient's current medications. Prior to Admission:  Prescriptions Prior to Admission  Medication Sig Dispense Refill Last Dose  . glipiZIDE (GLUCOTROL) 5 MG tablet Take 1 tablet (5 mg total) by mouth daily. 30 tablet 11 10/02/2015 at Unknown time  . lisinopril (PRINIVIL,ZESTRIL) 10 MG tablet Take 1 tablet (10 mg total) by mouth daily. 30 tablet 11 10/02/2015 at Unknown time  . diazepam (VALIUM) 5 MG tablet Take 1 tablet (5 mg total) by mouth every 8 (eight) hours as needed (to be taken with meclizine for vertigo). (Patient not taking: Reported on 10/03/2015) 20 tablet 0 Not Taking at Unknown time  . glipiZIDE (GLUCOTROL) 5 MG tablet Take by mouth daily before  breakfast.   Taking  . ibuprofen (ADVIL,MOTRIN) 600 MG tablet Take 1 tablet (600 mg total) by mouth every 6 (six) hours as needed for moderate pain. (Patient not taking: Reported on 10/03/2015) 20 tablet 0 Not Taking at Unknown time  . lisinopril (PRINIVIL,ZESTRIL) 10 MG tablet Take 10 mg by mouth daily.   Taking  . meclizine (ANTIVERT) 25 MG tablet Take 1 tablet (25 mg total) by mouth 3 (three) times daily as needed for dizziness or nausea. (Patient not taking: Reported on 10/03/2015) 30 tablet 1 Not Taking at Unknown time  . oxyCODONE (ROXICODONE) 5 MG immediate release tablet Take 1 tablet (5 mg total) by mouth every 6 (six) hours as needed for moderate pain. Do not drive while taking this medication. (Patient not taking: Reported on 12/05/2014) 15 tablet 0 Not Taking at Unknown time   Scheduled: . aspirin  300 mg Rectal Daily   Or  . aspirin  325 mg Oral Daily  . atorvastatin  40 mg Oral q1800  . enoxaparin (LOVENOX) injection  40 mg Subcutaneous Q24H  . glipiZIDE  5 mg Oral Daily  . insulin aspart  0-5 Units Subcutaneous QHS  . insulin aspart  0-9 Units Subcutaneous TID WC    ROS: History obtained from the patient  General ROS: negative for - chills, fatigue, fever, night sweats, weight gain or weight loss Psychological ROS: negative for - behavioral disorder, hallucinations, memory difficulties, mood swings  or suicidal ideation Ophthalmic ROS: negative for - blurry vision, double vision, eye pain or loss of vision ENT ROS: negative for - epistaxis, nasal discharge, oral lesions, sore throat, tinnitus or vertigo Allergy and Immunology ROS: negative for - hives or itchy/watery eyes Hematological and Lymphatic ROS: negative for - bleeding problems, bruising or swollen lymph nodes Endocrine ROS: negative for - galactorrhea, hair pattern changes, polydipsia/polyuria or temperature intolerance Respiratory ROS: negative for - cough, hemoptysis, shortness of breath or wheezing Cardiovascular  ROS: negative for - chest pain, dyspnea on exertion, edema or irregular heartbeat Gastrointestinal ROS: negative for - abdominal pain, diarrhea, hematemesis, nausea/vomiting or stool incontinence Genito-Urinary ROS: negative for - dysuria, hematuria, incontinence or urinary frequency/urgency Musculoskeletal ROS: negative for - joint swelling or muscular weakness Neurological ROS: as noted in HPI Dermatological ROS: negative for rash and skin lesion changes  Physical Examination: Blood pressure (!) 155/77, pulse 83, temperature 98.4 F (36.9 C), temperature source Oral, resp. rate 16, height 4\' 11"  (1.499 m), weight 62.7 kg (138 lb 4.8 oz), last menstrual period 08/12/2015, SpO2 100 %.  HEENT-  Normocephalic, no lesions, without obvious abnormality.  Normal external eye and conjunctiva.  Normal TM's bilaterally.  Normal auditory canals and external ears. Normal external nose, mucus membranes and septum.  Normal pharynx. Cardiovascular- S1, S2 normal, pulses palpable throughout   Lungs- chest clear, no wheezing, rales, normal symmetric air entry Abdomen- soft, non-tender; bowel sounds normal; no masses,  no organomegaly Extremities- no edema Lymph-no adenopathy palpable Musculoskeletal-no joint tenderness, deformity or swelling Skin-warm and dry, no hyperpigmentation, vitiligo, or suspicious lesions  Neurological Examination Mental Status: Alert, oriented, thought content appropriate.  Speech fluent without evidence of aphasia.  Able to follow 3 step commands without difficulty. Cranial Nerves: II: Discs flat bilaterally; Visual fields grossly normal, pupils equal, round, reactive to light and accommodation III,IV, VI: ptosis not present, extra-ocular motions intact bilaterally V,VII: smile symmetric, facial light touch sensation decreased on the right VIII: hearing normal bilaterally IX,X: gag reflex present XI: bilateral shoulder shrug XII: midline tongue extension Motor: Right  : Upper extremity   5/5    Left:     Upper extremity   5/5  Lower extremity   5/5     Lower extremity   5/5 Tone and bulk:normal tone throughout; no atrophy noted Sensory: Pinprick and light touch decreased on the right Deep Tendon Reflexes: 2+ and symmetric throughout Plantars: Right: downgoing   Left: downgoing Cerebellar: Normal finger-to-nose and normal heel-to-shin testing bilaterally Gait: not tested due to safety concerns    Laboratory Studies:  Basic Metabolic Panel:  Recent Labs Lab 09/27/15 1424 10/03/15 0100  NA 135 135  K 3.5 3.3*  CL 104 106  CO2 24 23  GLUCOSE 179* 222*  BUN 6 8  CREATININE 0.51 0.56  CALCIUM 9.3 8.8*    Liver Function Tests: No results for input(s): AST, ALT, ALKPHOS, BILITOT, PROT, ALBUMIN in the last 168 hours. No results for input(s): LIPASE, AMYLASE in the last 168 hours. No results for input(s): AMMONIA in the last 168 hours.  CBC:  Recent Labs Lab 09/27/15 1424 10/03/15 0100  WBC 10.1 10.4  HGB 12.0 10.9*  HCT 35.7 32.9*  MCV 84.0 84.6  PLT 321 299    Cardiac Enzymes:  Recent Labs Lab 09/27/15 1424 10/03/15 0100  TROPONINI <0.03 <0.03    BNP: Invalid input(s): POCBNP  CBG:  Recent Labs Lab 09/27/15 1433 10/03/15 0815 10/03/15 1210  GLUCAP 166* 205* 213*  Microbiology: Results for orders placed or performed during the hospital encounter of 06/17/14  GC/Chlamydia Probe Amp     Status: None   Collection Time: 06/17/14  4:41 AM  Result Value Ref Range Status   Micro Text Report   Final       SOURCE: VAGINAL    CHLAMYDIA                 CHLAMYDIA TRACHOMATIS NEGATIVE   N.GONORRHOEAE             N.GONORRHOEAE NEGATIVE   ANTIBIOTIC                                                      Wet prep, genital     Status: None   Collection Time: 06/17/14  4:41 AM  Result Value Ref Range Status   Micro Text Report   Final       SOURCE: VAGINAL    COMMENT                   FEW WHITE BLOOD CELLS SEEN    COMMENT                   NO TRICHOMONAS,SPERMATOZOA,YEAST,OR CLUE CELLS SEEN   ANTIBIOTIC                                                        Coagulation Studies: No results for input(s): LABPROT, INR in the last 72 hours.  Urinalysis: No results for input(s): COLORURINE, LABSPEC, PHURINE, GLUCOSEU, HGBUR, BILIRUBINUR, KETONESUR, PROTEINUR, UROBILINOGEN, NITRITE, LEUKOCYTESUR in the last 168 hours.  Invalid input(s): APPERANCEUR  Lipid Panel:    Component Value Date/Time   CHOL 188 10/03/2015 0100   TRIG 459 (H) 10/03/2015 0100   HDL 41 10/03/2015 0100   CHOLHDL 4.6 10/03/2015 0100   VLDL UNABLE TO CALCULATE IF TRIGLYCERIDE OVER 400 mg/dL 10/03/2015 0100   LDLCALC UNABLE TO CALCULATE IF TRIGLYCERIDE OVER 400 mg/dL 10/03/2015 0100    HgbA1C:  Lab Results  Component Value Date   HGBA1C 10.3 (H) 02/02/2012    Urine Drug Screen:  No results found for: LABOPIA, COCAINSCRNUR, LABBENZ, AMPHETMU, THCU, LABBARB  Alcohol Level: No results for input(s): ETH in the last 168 hours.  Other results: EKG: normal sinus rhythm at 85 bpm.  Imaging: Ct Head Wo Contrast  Result Date: 10/03/2015 CLINICAL DATA:  50 year old female with unilateral numbness. Numbness to the right side of the body for the past few days. EXAM: CT HEAD WITHOUT CONTRAST TECHNIQUE: Contiguous axial images were obtained from the base of the skull through the vertex without intravenous contrast. COMPARISON:  CT dated 10/26/2014 FINDINGS: The ventricles and sulci are appropriate in size for patient's age. Minimal periventricular and deep white matter chronic microvascular ischemic changes noted. There is a 1.9 x 1.7 cm area of hypodensity in the right cerebellar hemisphere, which is new from prior study and compatible with an area of infarct, likely subacute or chronic. A subcentimeter low attenuating focus in the right cerebellar hemisphere also represents an infarct, likely subacute or chronic. Correlation with clinical  exam is recommended. MRI may provide better evaluation  if there is high clinical concern for acute ischemia. There is no acute intracranial hemorrhage. No mass effect or midline shift. The visualized paranasal sinuses and mastoid air cells are clear. The calvarium is intact. IMPRESSION: No acute intracranial hemorrhage. 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 Clinical correlation is recommended. MRI may provide better evaluation if there is clinical concern for acute ischemia. Electronically Signed   By: Anner Crete M.D.   On: 10/03/2015 04:38  Mr Brain Wo Contrast  Result Date: 10/03/2015 CLINICAL DATA:  Right-sided numbness and tingling. Diabetes and hypertension EXAM: MRI HEAD WITHOUT CONTRAST MRA HEAD WITHOUT CONTRAST TECHNIQUE: Multiplanar, multiecho pulse sequences of the brain and surrounding structures were obtained without intravenous contrast. Angiographic images of the head were obtained using MRA technique without contrast. COMPARISON:  CT head 10/03/2015 FINDINGS: MRI HEAD FINDINGS Acute infarct in the left medial thalamus extending inferiorly into the left midbrain. No other areas of acute infarction. Chronic infarct right cerebellum. Ventricle size is normal. Negative for hydrocephalus. Cerebral volume is normal. Negative for intracranial hemorrhage. Negative for mass or edema. No shift of the midline structures. Mucosal edema in the paranasal sinuses. No orbital lesion. Pituitary not enlarged. Mastoid sinus clear bilaterally. MRA HEAD FINDINGS Diffuse intracranial atherosclerotic disease. Moderate to severe stenosis distal right vertebral artery. Left vertebral artery patent. PICA patent bilaterally. Basilar patent with mild atherosclerotic stenosis. Severe stenosis in the left posterior cerebral artery. Mild disease right posterior cerebral artery. Severe stenosis in the cavernous carotid bilaterally Moderate stenosis right anterior cerebral artery near  the anterior communicating artery. Mild stenosis right M1 segment. Left anterior cerebral artery and left middle cerebral arteries are patent without significant stenosis. Negative for cerebral aneurysm. IMPRESSION: Acute infarct left medial thalamus extending into the midbrain. Chronic infarct right cerebellum Severe intracranial atherosclerotic disease. Severe stenosis proximal left posterior cerebral artery accounts for the acute infarct. Electronically Signed   By: Franchot Gallo M.D.   On: 10/03/2015 11:44  US Carotid Bilateral  Result Date: 10/03/2015 CLINICAL DATA:  CVA. EXAM: BILATERAL CAROTID DUPLEX ULTRASOUND TECHNIQUE: Pearline Cables scale imaging, color Doppler and duplex ultrasound were performed of bilateral carotid and vertebral arteries in the neck. COMPARISON:  MRI 10/03/2015. FINDINGS: Criteria: Quantification of carotid stenosis is based on velocity parameters that correlate the residual internal carotid diameter with NASCET-based stenosis levels, using the diameter of the distal internal carotid lumen as the denominator for stenosis measurement. The following velocity measurements were obtained: RIGHT ICA:  43/17 cm/sec CCA:  0000000 cm/sec SYSTOLIC ICA/CCA RATIO:  0.6 DIASTOLIC ICA/CCA RATIO:  0.8 ECA:  105 cm/sec LEFT ICA:  69/30 cm/sec CCA:  [id number] cm/sec SYSTOLIC ICA/CCA RATIO:  0.9 DIASTOLIC ICA/CCA RATIO:  1.2 ECA:  155 cm/sec RIGHT CAROTID ARTERY: Mild right carotid bifurcation plaque. No flow limiting stenosis. RIGHT VERTEBRAL ARTERY:  Patent with antegrade flow. LEFT CAROTID ARTERY: Mild left carotid bifurcation plaque. No flow limiting stenosis. LEFT VERTEBRAL ARTERY:  Patent antegrade flow. Exam limited due to respiratory motion. IMPRESSION: 1. Mild bilateral carotid bifurcation plaque. No evidence of flow-limiting stenosis. Degrees of stenosis less 50% bilaterally. 2.  Vertebral arteries are patent antegrade flow. Electronically Signed   By: Marcello Moores  Register   On: 10/03/2015 12:17  Mr Jodene Nam  Head/brain Wo Cm  Result Date: 10/03/2015 CLINICAL DATA:  Right-sided numbness and tingling. Diabetes and hypertension EXAM: MRI HEAD WITHOUT CONTRAST MRA HEAD WITHOUT CONTRAST TECHNIQUE: Multiplanar, multiecho pulse sequences of the brain and surrounding structures were obtained without intravenous contrast.  Angiographic images of the head were obtained using MRA technique without contrast. COMPARISON:  CT head 10/03/2015 FINDINGS: MRI HEAD FINDINGS Acute infarct in the left medial thalamus extending inferiorly into the left midbrain. No other areas of acute infarction. Chronic infarct right cerebellum. Ventricle size is normal. Negative for hydrocephalus. Cerebral volume is normal. Negative for intracranial hemorrhage. Negative for mass or edema. No shift of the midline structures. Mucosal edema in the paranasal sinuses. No orbital lesion. Pituitary not enlarged. Mastoid sinus clear bilaterally. MRA HEAD FINDINGS Diffuse intracranial atherosclerotic disease. Moderate to severe stenosis distal right vertebral artery. Left vertebral artery patent. PICA patent bilaterally. Basilar patent with mild atherosclerotic stenosis. Severe stenosis in the left posterior cerebral artery. Mild disease right posterior cerebral artery. Severe stenosis in the cavernous carotid bilaterally Moderate stenosis right anterior cerebral artery near the anterior communicating artery. Mild stenosis right M1 segment. Left anterior cerebral artery and left middle cerebral arteries are patent without significant stenosis. Negative for cerebral aneurysm. IMPRESSION: Acute infarct left medial thalamus extending into the midbrain. Chronic infarct right cerebellum Severe intracranial atherosclerotic disease. Severe stenosis proximal left posterior cerebral artery accounts for the acute infarct. Electronically Signed   By: Franchot Gallo M.D.   On: 10/03/2015 11:44   Assessment: 50 y.o. female presenting with complaints of right sided numbness  for the past week. MRI of the brain personally reviewed and shows an acute left thalamic infarct.  MRA shows significant small vessel atherosclerosis.  This is likely cause of acute infarct with poorly controlled risk factors.  Patient on no antiplatelet therapy at home.  Has been noncompliant with antihypertensive and diabetic medications.  Carotid dopplers show no evidence of hemodynamically significant stenosis.  Echocardiogram pending.  A1c pending, LDL unable to be calculated due to elevated triglycerides.  . Stroke Risk Factors - diabetes mellitus, hyperlipidemia, hypertension and smoking  Plan: 1. HgbA1c 2. PT consult, OT consult, Speech consult 3. Echocardiogram 4. Prophylactic therapy-Antiplatelet med: Aspirin - dose 325mg  daily, Plavix 75 mg daily to be continued for 3 months.   5. Compliance stressed 6. NPO until RN stroke swallow screen 7. Telemetry monitoring 8. Frequent neuro checks 9. Smoking cessation counseling.   10. Statin initiation with target LDL<70.     Alexis Goodell, MD Neurology [phone] 10/03/2015, 2:26 PM

## 2015-10-03 NOTE — Progress Notes (Signed)
OT Cancellation Note  Patient Details Name: BITHA KEPLAR MRN: [id number] DOB: [birth year]   Cancelled Treatment:    Reason Eval/Treat Not Completed: Patient at procedure or test/ unavailable   Harrel Carina, MS, OTR/L  Harrel Carina 10/03/2015, 10:47 AM

## 2015-10-03 NOTE — ED Notes (Signed)
Attempted to call reports at 714am. Charge RN, Wellsite geologist, stated that accepting RN was unavailable to take report until 720am.

## 2015-10-03 NOTE — Progress Notes (Signed)
Speech Therapy Note: received order, reviewed chart notes. Consulted NSG then pt and family. Pt denied any difficulty swallowing and is currently on a regular diet; tolerates swallowing pills w/ water per NSG. Pt conversed at conversational level w/out deficits noted; pt and family denied any speech-language deficits.  No further skilled ST services indicated as pt appears at her baseline. Pt agreed. NSG to reconsult if any change in status.

## 2015-10-03 NOTE — ED Triage Notes (Addendum)
Patient ambulatory to triage with steady gait, without difficulty or distress noted; pt reports numbness to right side of body for last several days; seen here last Thursday for same; denies any other accomp symptoms or pain; pt A&Ox3, MAEW speech clear

## 2015-10-03 NOTE — Progress Notes (Signed)
*  PRELIMINARY RESULTS* Echocardiogram 2D Echocardiogram has been performed.  Sherrie Sport 10/03/2015, 9:53 AM

## 2015-10-03 NOTE — Progress Notes (Signed)
Physical Therapy Evaluation Patient Details Name: Lori Larsen MRN: [id number] DOB: [birth year] Today's Date: 10/03/2015   History of Present Illness  Pt is a 50 y/o female who was admitted with a diagnosis of a CVA. Per MD notes, pt has had persistent numbness and tingling on R side of body since 6 days ago. CT image report states pt has an acute L thalamus infaract and chronic R cerebellar infarct. PMH includes HTN and DM.  Clinical Impression  Pt is a pleasant 50 y/o female who present with R sided face, UE, and LE numbness/tingling. PLOF: Pt was independent with all household and community ambulation and with ADLs. Pt has a strong family support system. Decreased R-sided UE and LE coordination and sensation with light touch. Pt is currently modified independent with bed mobility, mod assist for transfers, and mod assist with ambulation. R lateral trunk lean in sitting, pt was able to self-correct. LOB noted with sit-stand transfer but was able to steady herself with assistance of RW. Pt demonstrates a more steady gait pattern with use of RW. Decreased stance time on R with slight R lateral trunk lean with gait (with RW). Pt will benefit from skilled PT in order to improve coordination, strength, and functional mobility. At this time pt is appropriate for inpatient rehabilitation to address her goal of returning to prior level of function.     Follow Up Recommendations CIR    Equipment Recommendations  Rolling walker with 5" wheels    Recommendations for Other Services       Precautions / Restrictions Precautions Precautions: Fall Restrictions Weight Bearing Restrictions: No      Mobility  Bed Mobility Overal bed mobility: Modified Independent             General bed mobility comments: Pt requires B UE support to pull herself from sit to supine. Pt requires verbal cueing to scoot to EOB but is able to perform independently.   Transfers Overall transfer level: Needs  assistance   Transfers: Sit to/from Stand Sit to Stand: Mod assist         General transfer comment: Pt requires mod asssist to stand demonstrates LOB upon standing. Pt is able to steady herself with assistance of PT.   Ambulation/Gait Ambulation/Gait assistance: Mod assist Ambulation Distance (Feet): 3 Feet Assistive device: 1 person hand held assist Gait Pattern/deviations: Step-to pattern;Decreased stance time - right;Decreased step length - right;Decreased step length - left   Gait velocity interpretation: Below normal speed for age/gender General Gait Details: Pt demonstrates a step to gait pattern and appears unsteady without the use of AD. Pt demonstrates decreased stance time on R.   Stairs            Wheelchair Mobility    Modified Rankin (Stroke Patients Only)       Balance Overall balance assessment: Needs assistance Sitting-balance support: No upper extremity supported;Feet supported Sitting balance-Leahy Scale: Good Sitting balance - Comments: Upon coming to sit from supine, pt demonstrates R lateral lean and is able to self correct. Able to perform sitting balance with B UE in lap.  Postural control: Right lateral lean Standing balance support: Bilateral upper extremity supported Standing balance-Leahy Scale: Good Standing balance comment: Pt requires use of B UE support on RW and mod assist from PT to demonstrate safe standing balance. Slight lateral lean to R.  Pertinent Vitals/Pain Pain Assessment: No/denies pain    Home Living Family/patient expects to be discharged to:: Private residence Living Arrangements: Children;Spouse/significant other Available Help at Discharge: Family Type of Home: House Home Access: Stairs to enter Entrance Stairs-Rails: None Entrance Stairs-Number of Steps: 3 Home Layout: One level Home Equipment: None      Prior Function Level of Independence: Independent          Comments: Pt was indepdent with household and communnity ambulation. Pt was independent with ADLs.     Hand Dominance   Dominant Hand: Right    Extremity/Trunk Assessment   Upper Extremity Assessment: RUE deficits/detail RUE Deficits / Details: Pt demonstrates decreased coordination with RAMPS and finger to nose with R UE.  B elbow flex/ext: 5/5   RUE Sensation: decreased light touch LUE Deficits / Details: decreased sensation to light touch only inside L elbow about 3 inches below elbow   Lower Extremity Assessment: RLE deficits/detail RLE Deficits / Details: Pt has coordination with heel to shin with R LE. B hip flexion:  4+/5       Communication   Communication: No difficulties  Cognition Arousal/Alertness: Awake/alert Behavior During Therapy: Flat affect Overall Cognitive Status: Within Functional Limits for tasks assessed                      General Comments      Exercises Other Exercises Other Exercises: Gait training x 80 feet with RW. Verbal cueing for steph-through gait pattern. Pt demonstrates decrease stance time on R LE. Pt is more stable with use of RW than without.      Assessment/Plan    PT Assessment Patient needs continued PT services  PT Diagnosis Difficulty walking   PT Problem List Decreased strength;Decreased activity tolerance;Decreased balance;Decreased mobility;Decreased coordination;Decreased knowledge of use of DME;Decreased safety awareness  PT Treatment Interventions DME instruction;Gait training;Stair training;Functional mobility training;Therapeutic activities;Therapeutic exercise;Balance training;Neuromuscular re-education;Patient/family education   PT Goals (Current goals can be found in the Care Plan section) Acute Rehab PT Goals Patient Stated Goal: To return home PT Goal Formulation: With patient Time For Goal Achievement: 10/17/15 Potential to Achieve Goals: Good    Frequency 7X/week   Barriers to discharge         Co-evaluation               End of Session Equipment Utilized During Treatment: Gait belt Activity Tolerance: Patient tolerated treatment well Patient left: in chair;with call bell/phone within reach;with chair alarm set;with family/visitor present Nurse Communication: Mobility status         Time: [id number] PT Time Calculation (min) (ACUTE ONLY): 36 min   Charges:         PT G Codes:        Georgina Pillion Oct 14, 2015, 5:06 PM  Georgina Pillion, SPT [phone]

## 2015-10-04 LAB — LIPID PANEL
CHOL/HDL RATIO: 4.8 ratio
Cholesterol: 172 mg/dL (ref 0–200)
HDL: 36 mg/dL — ABNORMAL LOW (ref >40)
LDL Cholesterol: 75 mg/dL (ref 0–99)
Triglycerides: 304 mg/dL — ABNORMAL HIGH (ref <150)
VLDL: 61 mg/dL — ABNORMAL HIGH (ref 0–40)

## 2015-10-04 LAB — BASIC METABOLIC PANEL
Anion gap: 8 (ref 5–15)
BUN: 9 mg/dL (ref 6–20)
CALCIUM: 8.7 mg/dL — AB (ref 8.9–10.3)
CHLORIDE: 106 mmol/L (ref 101–111)
CO2: 20 mmol/L — ABNORMAL LOW (ref 22–32)
CREATININE: 0.61 mg/dL (ref 0.44–1.00)
GFR calc non Af Amer: >60 mL/min (ref >60)
Glucose, Bld: 270 mg/dL — ABNORMAL HIGH (ref 65–99)
Potassium: 4.3 mmol/L (ref 3.5–5.1)
SODIUM: 134 mmol/L — AB (ref 135–145)

## 2015-10-04 LAB — HEMOGLOBIN A1C: HEMOGLOBIN A1C: 12.1 % — AB (ref 4.0–6.0)

## 2015-10-04 LAB — ECHOCARDIOGRAM COMPLETE
Height: 59 in
WEIGHTICAEL: 2212.8 [oz_av]

## 2015-10-04 LAB — GLUCOSE, CAPILLARY
GLUCOSE-CAPILLARY: 218 mg/dL — AB (ref 65–99)
GLUCOSE-CAPILLARY: 248 mg/dL — AB (ref 65–99)

## 2015-10-04 MED ORDER — NICOTINE 21 MG/24HR TD PT24: 21.0000 mg | MEDICATED_PATCH | Freq: Every day | TRANSDERMAL | 0 refills | Status: DC

## 2015-10-04 MED ORDER — ASPIRIN 325 MG PO TABS
325.0000 mg | ORAL_TABLET | Freq: Every day | ORAL | 0 refills | Status: AC
Start: 2015-10-04 — End: ?

## 2015-10-04 MED ORDER — ATORVASTATIN CALCIUM 40 MG PO TABS: 40.0000 mg | ORAL_TABLET | Freq: Every day | ORAL | 0 refills | Status: DC

## 2015-10-04 NOTE — Discharge Summary (Signed)
[facility] NAME: Mandi Sholes    MR#:  [id number]  DATE OF BIRTH:  1965/04/06  DATE OF ADMISSION:  10/03/2015 ADMITTING PHYSICIAN: Saundra Shelling, MD  DATE OF DISCHARGE: 10/04/2015  PRIMARY CARE PHYSICIAN: No PCP Per Patient    ADMISSION DIAGNOSIS:  CVA (cerebral infarction) [I63.9] Cerebral infarction due to unspecified mechanism [I63.9]  DISCHARGE DIAGNOSIS:  Principal Problem:   CVA (cerebral infarction)   SECONDARY DIAGNOSIS:   Past Medical History:  Diagnosis Date  . Diabetes mellitus without complication ([place])   . Hypertension     HOSPITAL COURSE:   50 year old female with a history of diabetes and essential hypertension who presents with right leg and right sided face numbness.  1. Acute infarct left medial thalamus extending into the midbrain: Patient was started on appropriate medications including antiplatelet and statin medication. She underwent MRI/MRA which showed above finding. Doppler showed no hemodynamically significant stenosis. Echocardiogram showed no cardiac source of thrombus. Neurology was consulted. Physical and occupational 3 deficit therapy recommended CIR, however patient has a son in therefore cannot go to inpatient rehabilitation. Patient will be discharged with home health.  2. Diabetes: Continue  ADA diet and glipizide.  3. Essential hypertension: She may resume hypertensive medications.   4. Tobacco dependence: patient counselled for 3 minutes To stop smoking. She is highly advised and encouraged to do so   DISCHARGE CONDITIONS AND DIET:   Stable for start on diabetic diet  CONSULTS OBTAINED:  Treatment Team:  Alexis Goodell, MD  DRUG ALLERGIES:  No Known Allergies  DISCHARGE MEDICATIONS:   Current Discharge Medication List    START taking these medications   Details  aspirin 325 MG tablet Take 1 tablet (325 mg total) by mouth daily. Qty: 120 tablet, Refills: 0     atorvastatin (LIPITOR) 40 MG tablet Take 1 tablet (40 mg total) by mouth daily at 6 PM. Qty: 30 tablet, Refills: 0    nicotine (NICODERM CQ - DOSED IN MG/24 HOURS) 21 mg/24hr patch Place 1 patch (21 mg total) onto the skin daily. Qty: 28 patch, Refills: 0      CONTINUE these medications which have NOT CHANGED   Details  glipiZIDE (GLUCOTROL) 5 MG tablet Take 1 tablet (5 mg total) by mouth daily. Qty: 30 tablet, Refills: 11    lisinopril (PRINIVIL,ZESTRIL) 10 MG tablet Take 1 tablet (10 mg total) by mouth daily. Qty: 30 tablet, Refills: 11    meclizine (ANTIVERT) 25 MG tablet Take 1 tablet (25 mg total) by mouth 3 (three) times daily as needed for dizziness or nausea. Qty: 30 tablet, Refills: 1    oxyCODONE (ROXICODONE) 5 MG immediate release tablet Take 1 tablet (5 mg total) by mouth every 6 (six) hours as needed for moderate pain. Do not drive while taking this medication. Qty: 15 tablet, Refills: 0      STOP taking these medications     diazepam (VALIUM) 5 MG tablet      ibuprofen (ADVIL,MOTRIN) 600 MG tablet               Today   CHIEF COMPLAINT:  Sensation is slowly improving. No focal deficit   VITAL SIGNS:  Blood pressure (!) 162/87, pulse 87, temperature 98.7 F (37.1 C), temperature source Oral, resp. rate 18, height 4\' 11"  (1.499 m), weight 62.7 kg (138 lb 4.8 oz), last menstrual period 08/12/2015, SpO2 100 %.   REVIEW OF SYSTEMS:  Review of Systems  Constitutional: Negative.  Negative for chills, fever and malaise/fatigue.  HENT: Negative.  Negative for ear discharge, ear pain, hearing loss, nosebleeds and sore throat.   Eyes: Negative.  Negative for blurred vision and pain.  Respiratory: Negative.  Negative for cough, hemoptysis, shortness of breath and wheezing.   Cardiovascular: Negative.  Negative for chest pain, palpitations and leg swelling.  Gastrointestinal: Negative.  Negative for abdominal pain, blood in stool, diarrhea, nausea and  vomiting.  Genitourinary: Negative.  Negative for dysuria.  Musculoskeletal: Negative.  Negative for back pain.  Skin: Negative.   Neurological: Negative for dizziness, tremors, speech change, focal weakness, seizures and headaches.  Endo/Heme/Allergies: Negative.  Does not bruise/bleed easily.  Psychiatric/Behavioral: Negative.  Negative for depression, hallucinations and suicidal ideas.     PHYSICAL EXAMINATION:  GENERAL:  50 y.o.-year-old patient lying in the bed with no acute distress.  NECK:  Supple, no jugular venous distention. No thyroid enlargement, no tenderness.  LUNGS: Normal breath sounds bilaterally, no wheezing, rales,rhonchi  No use of accessory muscles of respiration.  CARDIOVASCULAR: S1, S2 normal. No murmurs, rubs, or gallops.  ABDOMEN: Soft, non-tender, non-distended. Bowel sounds present. No organomegaly or mass.  EXTREMITIES: No pedal edema, cyanosis, or clubbing.  PSYCHIATRIC: The patient is alert and oriented x 3.  SKIN: No obvious rash, lesion, or ulcer.  Neuro: Strength is 5 out of 5 bilaterally and symmetrically however she's decreased sensation of the right arm and leg as well as face.  DATA REVIEW:   CBC  Recent Labs Lab 10/03/15 0100  WBC 10.4  HGB 10.9*  HCT 32.9*  PLT 299    Chemistries   Recent Labs Lab 10/04/15 0405  NA 134*  K 4.3  CL 106  CO2 20*  GLUCOSE 270*  BUN 9  CREATININE 0.61  CALCIUM 8.7*    Cardiac Enzymes  Recent Labs Lab 09/27/15 1424 10/03/15 0100  TROPONINI <0.03 <0.03    Microbiology Results  @[id number] @  RADIOLOGY:  Ct Head Wo Contrast  Result Date: 10/03/2015 CLINICAL DATA:  50 year old female with unilateral numbness. Numbness to the right side of the body for the past few days. EXAM: CT HEAD WITHOUT CONTRAST TECHNIQUE: Contiguous axial images were obtained from the base of the skull through the vertex without intravenous contrast. COMPARISON:  CT dated 10/26/2014 FINDINGS: The ventricles and sulci  are appropriate in size for patient's age. Minimal periventricular and deep white matter chronic microvascular ischemic changes noted. There is a 1.9 x 1.7 cm area of hypodensity in the right cerebellar hemisphere, which is new from prior study and compatible with an area of infarct, likely subacute or chronic. A subcentimeter low attenuating focus in the right cerebellar hemisphere also represents an infarct, likely subacute or chronic. Correlation with clinical exam is recommended. MRI may provide better evaluation if there is high clinical concern for acute ischemia. There is no acute intracranial hemorrhage. No mass effect or midline shift. The visualized paranasal sinuses and mastoid air cells are clear. The calvarium is intact. IMPRESSION: No acute intracranial hemorrhage. 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 Clinical correlation is recommended. MRI may provide better evaluation if there is clinical concern for acute ischemia. Electronically Signed   By: Anner Crete M.D.   On: 10/03/2015 04:38  Mr Brain Wo Contrast  Result Date: 10/03/2015 CLINICAL DATA:  Right-sided numbness and tingling. Diabetes and hypertension EXAM: MRI HEAD WITHOUT CONTRAST MRA HEAD WITHOUT CONTRAST TECHNIQUE: Multiplanar, multiecho pulse sequences of the brain and surrounding structures were obtained  without intravenous contrast. Angiographic images of the head were obtained using MRA technique without contrast. COMPARISON:  CT head 10/03/2015 FINDINGS: MRI HEAD FINDINGS Acute infarct in the left medial thalamus extending inferiorly into the left midbrain. No other areas of acute infarction. Chronic infarct right cerebellum. Ventricle size is normal. Negative for hydrocephalus. Cerebral volume is normal. Negative for intracranial hemorrhage. Negative for mass or edema. No shift of the midline structures. Mucosal edema in the paranasal sinuses. No orbital lesion. Pituitary not enlarged.  Mastoid sinus clear bilaterally. MRA HEAD FINDINGS Diffuse intracranial atherosclerotic disease. Moderate to severe stenosis distal right vertebral artery. Left vertebral artery patent. PICA patent bilaterally. Basilar patent with mild atherosclerotic stenosis. Severe stenosis in the left posterior cerebral artery. Mild disease right posterior cerebral artery. Severe stenosis in the cavernous carotid bilaterally Moderate stenosis right anterior cerebral artery near the anterior communicating artery. Mild stenosis right M1 segment. Left anterior cerebral artery and left middle cerebral arteries are patent without significant stenosis. Negative for cerebral aneurysm. IMPRESSION: Acute infarct left medial thalamus extending into the midbrain. Chronic infarct right cerebellum Severe intracranial atherosclerotic disease. Severe stenosis proximal left posterior cerebral artery accounts for the acute infarct. Electronically Signed   By: Franchot Gallo M.D.   On: 10/03/2015 11:44  US Carotid Bilateral  Result Date: 10/03/2015 CLINICAL DATA:  CVA. EXAM: BILATERAL CAROTID DUPLEX ULTRASOUND TECHNIQUE: Pearline Cables scale imaging, color Doppler and duplex ultrasound were performed of bilateral carotid and vertebral arteries in the neck. COMPARISON:  MRI 10/03/2015. FINDINGS: Criteria: Quantification of carotid stenosis is based on velocity parameters that correlate the residual internal carotid diameter with NASCET-based stenosis levels, using the diameter of the distal internal carotid lumen as the denominator for stenosis measurement. The following velocity measurements were obtained: RIGHT ICA:  43/17 cm/sec CCA:  0000000 cm/sec SYSTOLIC ICA/CCA RATIO:  0.6 DIASTOLIC ICA/CCA RATIO:  0.8 ECA:  105 cm/sec LEFT ICA:  69/30 cm/sec CCA:  [id number] cm/sec SYSTOLIC ICA/CCA RATIO:  0.9 DIASTOLIC ICA/CCA RATIO:  1.2 ECA:  155 cm/sec RIGHT CAROTID ARTERY: Mild right carotid bifurcation plaque. No flow limiting stenosis. RIGHT VERTEBRAL ARTERY:   Patent with antegrade flow. LEFT CAROTID ARTERY: Mild left carotid bifurcation plaque. No flow limiting stenosis. LEFT VERTEBRAL ARTERY:  Patent antegrade flow. Exam limited due to respiratory motion. IMPRESSION: 1. Mild bilateral carotid bifurcation plaque. No evidence of flow-limiting stenosis. Degrees of stenosis less 50% bilaterally. 2.  Vertebral arteries are patent antegrade flow. Electronically Signed   By: Marcello Moores  Register   On: 10/03/2015 12:17  Mr Jodene Nam Head/brain Wo Cm  Result Date: 10/03/2015 CLINICAL DATA:  Right-sided numbness and tingling. Diabetes and hypertension EXAM: MRI HEAD WITHOUT CONTRAST MRA HEAD WITHOUT CONTRAST TECHNIQUE: Multiplanar, multiecho pulse sequences of the brain and surrounding structures were obtained without intravenous contrast. Angiographic images of the head were obtained using MRA technique without contrast. COMPARISON:  CT head 10/03/2015 FINDINGS: MRI HEAD FINDINGS Acute infarct in the left medial thalamus extending inferiorly into the left midbrain. No other areas of acute infarction. Chronic infarct right cerebellum. Ventricle size is normal. Negative for hydrocephalus. Cerebral volume is normal. Negative for intracranial hemorrhage. Negative for mass or edema. No shift of the midline structures. Mucosal edema in the paranasal sinuses. No orbital lesion. Pituitary not enlarged. Mastoid sinus clear bilaterally. MRA HEAD FINDINGS Diffuse intracranial atherosclerotic disease. Moderate to severe stenosis distal right vertebral artery. Left vertebral artery patent. PICA patent bilaterally. Basilar patent with mild atherosclerotic stenosis. Severe stenosis in the left posterior cerebral artery. Mild  disease right posterior cerebral artery. Severe stenosis in the cavernous carotid bilaterally Moderate stenosis right anterior cerebral artery near the anterior communicating artery. Mild stenosis right M1 segment. Left anterior cerebral artery and left middle cerebral arteries  are patent without significant stenosis. Negative for cerebral aneurysm. IMPRESSION: Acute infarct left medial thalamus extending into the midbrain. Chronic infarct right cerebellum Severe intracranial atherosclerotic disease. Severe stenosis proximal left posterior cerebral artery accounts for the acute infarct. Electronically Signed   By: Franchot Gallo M.D.   On: 10/03/2015 11:44     Management plans discussed with the patient and she is in agreement. Stable for discharge home with hhc  Patient should follow up with pcp in 1 week  CODE STATUS:     Code Status Orders        Start     Ordered   10/03/15 0801  Full code  Continuous     10/03/15 0801    Code Status History    Date Active Date Inactive Code Status Order ID Comments User Context   10/03/2015  8:01 AM 10/03/2015  9:24 AM Full Code [id number]  Saundra Shelling, MD Inpatient      TOTAL TIME TAKING CARE OF THIS PATIENT: 36 minutes.    Note: This dictation was prepared with Dragon dictation along with smaller phrase technology. Any transcriptional errors that result from this process are unintentional.  Jerzee Jerome M.D on 10/04/2015 at 11:56 AM  Between 7am to 6pm - Pager - [phone] After 6pm go to [email or web address] - password [email or web address] [facility]  Office  [phone]  CC: Primary care physician; No PCP Per Patient

## 2015-10-04 NOTE — Care Management (Signed)
History & physical, home health, physical therapy, face sheet, and rolling walker request faxed to [facility]. Shelbie Ammons RN MSN CCM Care Management [phone]

## 2015-10-04 NOTE — Progress Notes (Addendum)
Inpatient Rehabilitation  Per OT and PT request patient was screened by Gunnar Fusi for appropriateness for an Inpatient Acute Rehab consult.  I spoke with the patient via phone and shared information about our program.  While patient appears to be a great candidate for IP Rehab; she expressed concern for the care of her young child should she participate in the program.  Therefore she has elected to go home with home health therapies and a reported caregiver.  Note via chart review that RN CM is aware of plan.  If patient's situation should change please notify me; call with questions.     Carmelia Roller., CCC/SLP Admission Coordinator  [place]  Cell [phone]

## 2015-10-04 NOTE — Progress Notes (Signed)
Physical Therapy Treatment Patient Details Name: Lori Larsen MRN: [id number] DOB: Dec 21, 1965 Today's Date: 10/04/2015    History of Present Illness Pt is a 50 y/o female who was admitted with a diagnosis of a CVA. Per MD notes, pt has had persistent numbness and tingling on R side of body since 6 days ago. CT image report states pt has an acute L thalamus infaract and chronic R cerebellar infarct. PMH includes HTN and DM.    PT Comments    Pt gradually progressing towards functional goals. She was able to increase ambulation up to 200 ft with FWW and min guard. Transfers performed with mod I. Mild postural sway during ambulation but no LOB. No neglect noted during session or lateral lean. Plan to progress mobility, strengthening and balance as tolerated.  Follow Up Recommendations  CIR     Equipment Recommendations  Rolling walker with 5" wheels    Recommendations for Other Services       Precautions / Restrictions Precautions Precautions: Fall Restrictions Weight Bearing Restrictions: No    Mobility  Bed Mobility Overal bed mobility: Modified Independent             General bed mobility comments: no difficulties  Transfers Overall transfer level: Modified independent Equipment used: Rolling walker (2 wheeled) Transfers: Sit to/from [facility] Sit to Stand: Modified independent (Device/Increase time) Stand pivot transfers: Modified independent (Device/Increase time)       General transfer comment: Min cues for hand placement  Ambulation/Gait Ambulation/Gait assistance: Min guard Ambulation Distance (Feet): 200 Feet Assistive device: Rolling walker (2 wheeled) Gait Pattern/deviations: Decreased stride length;Narrow base of support Gait velocity: reduced Gait velocity interpretation: Below normal speed for age/gender General Gait Details: Fair speed, steady with no LOB, mild postural sway   Stairs Stairs: Yes Stairs assistance: Min  guard Stair Management: No rails Number of Stairs: 6 General stair comments: pt generally steady with mild postural sway, cues for performing one step at the time for safety  Wheelchair Mobility    Modified Rankin (Stroke Patients Only)       Balance Overall balance assessment: Needs assistance Sitting-balance support: No upper extremity supported Sitting balance-Leahy Scale: Good Sitting balance - Comments: maintains with no difficulties or lateral leaning   Standing balance support: Bilateral upper extremity supported Standing balance-Leahy Scale: Good Standing balance comment: no lateral lean, maintains with no LOB                    Cognition Arousal/Alertness: Awake/alert Behavior During Therapy: WFL for tasks assessed/performed Overall Cognitive Status: Within Functional Limits for tasks assessed                      Exercises Other Exercises Other Exercises: Pt ambulated 283ft x 2 and 125 ft x2 with FWW and min guard. Cues for staying close to [facility]. Fair speed with steady gait and mild postural sway. No neglect noted. Other Exercises: Navigated 6 steps with no rails with min guard. Cues for performing one step at the time for increased safety. Mild postural sway but no LOB.    General Comments        Pertinent Vitals/Pain Pain Assessment: No/denies pain    Home Living                      Prior Function            PT Goals (current goals can now be found in the  care plan section) Acute Rehab PT Goals Patient Stated Goal: To return home PT Goal Formulation: With patient Time For Goal Achievement: 10/17/15 Potential to Achieve Goals: Good Progress towards PT goals: Progressing toward goals    Frequency  7X/week    PT Plan Current plan remains appropriate    Co-evaluation             End of Session Equipment Utilized During Treatment: Gait belt Activity Tolerance: Patient tolerated treatment well Patient left: in  chair;with call bell/phone within reach;with chair alarm set;with family/visitor present     Time: [id number] PT Time Calculation (min) (ACUTE ONLY): 23 min  Charges:  $Gait Training: 23-37 mins                    G Codes:      Neoma Laming, PT, DPT  10/04/15, 11:33 AM [phone]

## 2015-10-04 NOTE — Progress Notes (Signed)
Inpatient Diabetes Program Recommendations  AACE/ADA: New Consensus Statement on Inpatient Glycemic Control (2015)  Target Ranges:  Prepandial:   less than 140 mg/dL      Peak postprandial:   less than 180 mg/dL (1-2 hours)      Critically ill patients:  140 - 180 mg/dL   Results for Lori Larsen, Lori Larsen (MRN [id number]) as of 10/04/2015 10:27  Ref. Range 10/03/2015 08:15 10/03/2015 12:10 10/03/2015 16:42 10/03/2015 21:00 10/04/2015 07:29  Glucose-Capillary Latest Ref Range: 65 - 99 mg/dL 205 (H) 213 (H) 145 (H) 189 (H) 218 (H)   Review of Glycemic Control  Diabetes history: DM2 Outpatient Diabetes medications: Glipizide 5 mg daily Current orders for Inpatient glycemic control: Glipizide 5 mg daily, Novolog 0-9 units TID with meals, Novolog 0-5 units QHS  Inpatient Diabetes Program Recommendations: HgbA1C: A1C in process. Diet: Added Carb Modified to [employer].  Chart reviewed; do not recommend any changes with DM medications at this time.  Thanks, Barnie Alderman, RN, MSN, CDE Diabetes Coordinator Inpatient Diabetes Program [phone] (Team Pager from [place] to [place]) [phone] ([facility] office) [phone] [facility] office) [phone] [facility] office)

## 2015-10-04 NOTE — Care Management (Addendum)
Admitted to [facility] with the diagnosis of CVA. Lives with son, Irene Limbo. (son is 51 years old?). Mother is Dailey Eade, she lives in [place] [phone]). Primary care physician is Dr. Caryn Section. States she hasn't seen him in a long while, would like new primary. Gave list of physicians accepting new patients. Will call [facility] practice. No home health in the past. Works at [employer] x 3 years. Takes care of all basic and instrumental activities  of daily living herself, drives. Good appetite. No falls. Physical therapy evaluation completed. Recommending inpatient acute rehabilitation.  Dr. Benjie Karvonen and myself talk to Ms. Janney and her mother at the bedside. Declining inpatient acute rehabilitation and skilled nursing at this time. Mother has to go back to [place] on Monday. Patient would like home health and therapy in the home. States she has a friend that can come and stay with her. [place]. Advanced unable to take [employer]. Will call [facility]. Encouraged calling [facility] and getting a follow-up appointment arranged so [facility] can come into the home. Discharge to home today with Home Health per Dr. Benjie Karvonen. Shelbie Ammons RN MSN CCM Care Management [phone]

## 2015-10-04 NOTE — Clinical Social Work Note (Signed)
CSW received a consult for CIR. Per RNCM, pt will returning home with home health services as pt declined CIR. CSW is signing off as no further needs identified.   Darden Dates, MSW, LCSW  Clinical Social Worker  [phone]

## 2015-10-04 NOTE — Progress Notes (Signed)
MD making rounds. Discharge orders received. IV removed. Prescriptions E-scribed to pharmacy. Discharge paperwork provided, explained, signed and witnessed. No unanswered questions. Discharged via wheelchair by Nursing Staff. Belongings sent with patient and family.

## 2015-10-04 NOTE — Plan of Care (Signed)
Problem: Education: Goal: Knowledge of secondary prevention will improve Outcome: Progressing Reinforced education about risk factors, specifically smoking and medication non-compliance.  Patient and sister with questions about smoking cessation information.

## 2015-10-23 ENCOUNTER — Other Ambulatory Visit: Payer: Self-pay | Admitting: Internal Medicine

## 2015-10-23 DIAGNOSIS — Z1231 Encounter for screening mammogram for malignant neoplasm of breast: Secondary | ICD-10-CM

## 2015-11-09 ENCOUNTER — Other Ambulatory Visit: Payer: Self-pay | Admitting: Internal Medicine

## 2015-11-09 ENCOUNTER — Ambulatory Visit
Admission: RE | Admit: 2015-11-09 | Discharge: 2015-11-09 | Disposition: A | Discharge status: Home / Self Care | Payer: Managed Care, Other (non HMO) | Source: Ambulatory Visit | Attending: Internal Medicine | Admitting: Internal Medicine

## 2015-11-09 DIAGNOSIS — Z1231 Encounter for screening mammogram for malignant neoplasm of breast: Secondary | ICD-10-CM

## 2015-11-21 DIAGNOSIS — I639 Cerebral infarction, unspecified: Secondary | ICD-10-CM | POA: Insufficient documentation

## 2015-12-03 ENCOUNTER — Ambulatory Visit: Payer: Managed Care, Other (non HMO) | Attending: Neurology

## 2015-12-03 VITALS — BP 136/86 | HR 92

## 2015-12-03 DIAGNOSIS — R2681 Unsteadiness on feet: Secondary | ICD-10-CM

## 2015-12-03 DIAGNOSIS — M6281 Muscle weakness (generalized): Secondary | ICD-10-CM | POA: Diagnosis not present

## 2015-12-03 NOTE — Therapy (Signed)
[place] MAIN [facility] SERVICES [address] [place], [place], [address] Phone: [phone]   Fax:  [phone]  Physical Therapy Evaluation  Patient Details  Name: Lori Larsen MRN: [id number] Date of Birth: [birth year] Referring Provider: Anabel Bene, MD  Encounter Date: 12/03/2015      PT End of Session - 12/03/15 1407    Visit Number 1   Number of Visits 12   Date for PT Re-Evaluation 01/14/16   PT Start Time 1020   PT Stop Time 1114   PT Time Calculation (min) 54 min   Equipment Utilized During Treatment Gait belt   Activity Tolerance Patient tolerated treatment well   Behavior During Therapy [facility] for tasks assessed/performed      Past Medical History:  Diagnosis Date  . Diabetes mellitus without complication ([place])   . Hypertension     Past Surgical History:  Procedure Laterality Date  . CESAREAN SECTION      Vitals:   12/03/15 1033  BP: 136/86  Pulse: 92  SpO2: 100%         Subjective Assessment - 12/03/15 1041    Subjective Patient reports balance difficulties and weakness of the right side following stroke on 10/03/15. Patient reports difficulty with raising the right arm overhead reporting "heaviness" when perfroming  and states difficulty with moving her right leg against gravity. Patient states intermittant unsteadiness on her feet when walking and standing for long periods of time. And has difficulty with performing fine hand movements due to lack of sensation and coordination. Pt states she does not have any pain.  Reports requiring increased time with walking and ascending/descending steps stating she must grab rails to ascend/descend stairs. Reports she is currently not working and is concerned with not having the funds for her care.    Pertinent History 10/03/15 while at work started to feel numbness and tingling in her hand which transformed into right sided weakness and loss of sensation. Went to ER stayed  overnight and went home. Nurse visitations stopped due to increase expense.    Limitations Standing;Walking   How long can you stand comfortably? 30 before fatigue   Patient Stated Goals Patient states she wants to feel better and return to work.    Currently in Pain? No/denies            [facility] PT Assessment - 12/03/15 1034      Assessment   Medical Diagnosis M62.89 (ICD-10-CM) - Weakness of right side of body   Referring Provider Anabel Bene, MD   Onset Date/Surgical Date 10/02/15   Hand Dominance Right   Next MD Visit unknown   Prior Therapy none     Balance Screen   Has the patient fallen in the past 6 months No   Has the patient had a decrease in activity level because of a fear of falling?  Yes   Is the patient reluctant to leave their home because of a fear of falling?  No     Home Environment   Living Environment Private residence   Living Arrangements Spouse/significant other   Available Help at Discharge Family   Type of [place] to enter   Entrance Stairs-Number of Steps 3   Entrance Stairs-Rails Can reach both   [place] One level   [place] - 4 wheels     Prior Function   Level of Independence Independent   Vocation Full time employment  Vocation Requirements Operator: Stand, walking,   Leisure Visiting family/friends     Cognition   Overall Cognitive Status Within Functional Limits for tasks assessed     Transfers   Five time sit to stand comments  17.64     Ambulation/Gait   Gait Pattern Decreased arm swing - right;Decreased arm swing - left;Step-through pattern;Poor foot clearance - right   Gait velocity .84m/s     Standardized Balance Assessment   Standardized Balance Assessment Berg Balance Test     Berg Balance Test   Sit to Stand Able to stand without using hands and stabilize independently   Standing Unsupported Able to stand safely 2 minutes   Sitting with Back Unsupported but Feet Supported  on Floor or Stool Able to sit safely and securely 2 minutes   Stand to Sit Sits safely with minimal use of hands   Transfers Able to transfer safely, minor use of hands   Standing Unsupported with Eyes Closed Able to stand 3 seconds   Standing Ubsupported with Feet Together Able to place feet together independently but unable to hold for 30 seconds   From Standing, Reach Forward with Outstretched Arm Can reach forward >12 cm safely (5")   From Standing Position, Pick up Object from Floor Able to pick up shoe, needs supervision   From Standing Position, Turn to Look Behind Over each Shoulder Looks behind from both sides and weight shifts well   Turn 360 Degrees Able to turn 360 degrees safely in 4 seconds or less   Standing Unsupported, Alternately Place Feet on Step/Stool Needs assistance to keep from falling or unable to try   Standing Unsupported, One Foot in [employer] balance while stepping or standing   Standing on One Leg Unable to try or needs assist to prevent fall   Total Score 38     Outcome Measures TUG: 13.29 sec   Observation:  Gait: Decreased arm swing bilaterally, decreased step length and trunk rotation.  AROM/MMT: RLE: Hip flexion, knee ext, knee flexion, hip add, hip abd: 3+/5; dorsiflexion: 4+/5 RUE: WNL LUE: WNL LLE: WNL  RAMPS: WNL  Therapeutic exercise: Sit to stands -- x 10 with cueing on glute and muscle activation  Ambulation -- 2 x 23ft with emphasis on arm swing and steppage pattern.        PT Education - 12/03/15 1358    Education provided Yes   Education Details Educated on prognosis, expectations of therapy and future goals of PT   Person(s) Educated Patient   Methods Explanation;Demonstration   Comprehension Verbalized understanding;Returned demonstration            Plan - 12/03/15 1408    Clinical Impression Statement Pt is a 50 yo right handed female presenting with unsteadiness on her feet and right-sided weakness secondary to CVA on  12/04/15. Patient demonstrates balance difficulties as indicated by her BERG, TUG, 39m walk, and 5xSTS scores all indicating balance difficulties and increased fall risk. Patient also demonstrates decreased strength as indicated by her MMT scores and will pt benefit from skilled therapy to address these limitations to return to prior level of function.    Rehab Potential Good   Clinical Impairments Affecting Rehab Potential (+) family support (-) financial situation   PT Frequency 2x / week   PT Duration 6 weeks   PT Treatment/Interventions ADLs/Self Care Home Management;Cryotherapy;Electrical Stimulation;Ultrasound;Moist Heat;Gait training;Stair training;Therapeutic activities;Therapeutic exercise;Neuromuscular re-education;Patient/family education;Passive range of motion;Manual techniques   PT Next Visit Plan 6 min walk test,  balancing measures   PT Home Exercise Plan sit to stands   Consulted and Agree with Plan of Care Patient      Patient will benefit from skilled therapeutic intervention in order to improve the following deficits and impairments:  Abnormal gait, Decreased mobility, Decreased coordination, Increased muscle spasms, Impaired UE functional use, Decreased strength, Decreased range of motion, Decreased endurance, Impaired sensation, Difficulty walking, Decreased balance  Visit Diagnosis: Muscle weakness (generalized) - Plan: PT plan of care cert/re-cert  Unsteadiness on feet - Plan: PT plan of care cert/re-cert     Problem List Patient Active Problem List   Diagnosis Date Noted  . CVA (cerebral infarction) 10/03/2015    Blythe Stanford, PT DPT 12/03/2015, 5:49 PM  [place] MAIN [facility] SERVICES [address] [place], [place], [address] Phone: [phone]   Fax:  [phone]  Name: Lori Larsen MRN: [id number] Date of Birth: January 05, 1966

## 2015-12-12 ENCOUNTER — Encounter: Payer: Self-pay | Admitting: Physical Therapy

## 2015-12-12 ENCOUNTER — Ambulatory Visit: Payer: Managed Care, Other (non HMO) | Attending: Neurology | Admitting: Physical Therapy

## 2015-12-12 DIAGNOSIS — M6281 Muscle weakness (generalized): Secondary | ICD-10-CM | POA: Insufficient documentation

## 2015-12-12 DIAGNOSIS — R2689 Other abnormalities of gait and mobility: Secondary | ICD-10-CM | POA: Insufficient documentation

## 2015-12-12 DIAGNOSIS — R2681 Unsteadiness on feet: Secondary | ICD-10-CM

## 2015-12-12 NOTE — Therapy (Signed)
[place] MAIN [facility] SERVICES [address] [place], [place], [address] Phone: [phone]   Fax:  [phone]  Physical Therapy Treatment  Patient Details  Name: Lori Larsen MRN: [id number] Date of Birth: 1965/11/21 Referring Provider: Anabel Bene, MD  Encounter Date: 12/12/2015      PT End of Session - 12/12/15 1346    Visit Number 2   Number of Visits 12   Date for PT Re-Evaluation 01/14/16   PT Start Time 1302   PT Stop Time 1344   PT Time Calculation (min) 42 min   Equipment Utilized During Treatment Gait belt   Activity Tolerance Patient tolerated treatment well   Behavior During Therapy [facility] for tasks assessed/performed      Past Medical History:  Diagnosis Date  . Diabetes mellitus without complication ([place])   . Hypertension     Past Surgical History:  Procedure Laterality Date  . CESAREAN SECTION      There were no vitals filed for this visit.      Subjective Assessment - 12/12/15 1304    Subjective Pt notes she has been doing exercises, and they are going well. She states she is feeling well today.   Pertinent History 10/03/15 while at work started to feel numbness and tingling in her hand which transformed into right sided weakness and loss of sensation. Went to ER stayed overnight and went home. Nurse visitations stopped due to increase expense.    Limitations Standing;Walking   How long can you stand comfortably? 30 before fatigue   Patient Stated Goals Patient states she wants to feel better and return to work.    Currently in Pain? No/denies     Treatment: Warm up on NuStep, L2 x3 min. BUE/LE; unbilled Sit to stands from mat table at normal chair height with 1.5# bar weight into shoulder flexion, 3x6, min VCs for pre-positioning before activity Forward/backward walking on airex balance beam, with Sabeo ball movements x4, 4 sets total, modA with 1 knee buckling, minA otherwise for safety, very  unsteady, min VCs to use hands prn, 0-1 HHA,   Initiated into HEP: Seated hip flexion, 2x10, min VCs for initial positioning Seated hip abduction, 2x8, min VCs for initial set up, yellow tband resistance, min VCs for initial set up and resistance difficulty Seated LAQ, no resistance, min VCs for initial set up and energy conservation when performing activities at home Handout given; no resistance band due to difficulty with fine motor movements of RLE  Seated hip add on small green ball, 2x10, min VCs for squeezing and holding 1-2 seconds Static stand on airex pad x30 seconds, with UE movements x10 and vertical head movements x10, min VCs for increasing difficulty when performing well Marches on firm surface, 2x15, min VCs to hold on in order to increase ROM, 2 HHA, CGA for safety Heel/toe raises, 3x8, min VCs to increase ROM into PF/DF; CGA for safety, 0-1 HHA Lateral walking, no resistance, 2 HHA, CGA for safety, min VCs for initial set up of exercise.                               PT Education - 12/12/15 1346    Education provided Yes   Education Details HEP progressed, energy conservation             PT Long Term Goals - 12/03/15 1651      PT LONG TERM  GOAL #1   Title Pt will score <15 sec on the 5xSTS to demonstrate functional improvement in lower extremity strength and allow for improved about to rise out of a chair   Baseline 17.64sec   Time 6   Period Weeks   Status New     PT LONG TERM GOAL #2   Title Pt will score a >1.5m/s on 50m walk test to demonstrate significant improvement in ambulation speed and decrease in fall risk   Baseline .60m/s   Time 6   Period Weeks   Status New     PT LONG TERM GOAL #3   Title Patient will score a 48/56 on the BERG balance scale to demonstrate significant improvement in fall risk and improved ability to safely ambulate.   Baseline 38/56   Time 6   Period Weeks   Status New               Plan  - 12/12/15 1347    Clinical Impression Statement Pt presented to PT with no pain stating she feels well. She demonstrates BLE weakness during activities with 1 knee buckle on RLE with modA from SPT to regain balance and GA-minA during other activities for safety. Pt requires min VCs during activities to perform exercises correctly. She also needs 3-4 rest breaks due to BLE fatigue. Pt is very motivated and wants to get back to her PLOF. Pt would continue to benefit from skilled PT in order to address RLE weakness, improve balance, and address safety during mobility.    Rehab Potential Good   Clinical Impairments Affecting Rehab Potential (+) family support (-) financial situation   PT Frequency 2x / week   PT Duration 6 weeks   PT Treatment/Interventions ADLs/Self Care Home Management;Cryotherapy;Electrical Stimulation;Ultrasound;Moist Heat;Gait training;Stair training;Therapeutic activities;Therapeutic exercise;Neuromuscular re-education;Patient/family education;Passive range of motion;Manual techniques   PT Next Visit Plan 6 min walk test, balancing measures   PT Home Exercise Plan sit to stands   Consulted and Agree with Plan of Care Patient      Patient will benefit from skilled therapeutic intervention in order to improve the following deficits and impairments:  Abnormal gait, Decreased mobility, Decreased coordination, Increased muscle spasms, Impaired UE functional use, Decreased strength, Decreased range of motion, Decreased endurance, Impaired sensation, Difficulty walking, Decreased balance  Visit Diagnosis: Muscle weakness (generalized)  Unsteadiness on feet     Problem List Patient Active Problem List   Diagnosis Date Noted  . CVA (cerebral infarction) 10/03/2015   Tilman Neat, SPT This entire session was performed under direct supervision and direction of a licensed therapist/therapist assistant . I have personally read, edited and approve of the note as  written.  Trotter,Margaret PT, DPT 12/12/2015, 2:05 PM  [place] MAIN [facility] SERVICES [address] [place], [place], [address] Phone: [phone]   Fax:  [phone]  Name: Lori Larsen MRN: [id number] Date of Birth: 12-08-1965

## 2015-12-12 NOTE — Patient Instructions (Addendum)
  HIP / KNEE: Flexion - Sitting    Raise knee to chest. Keep back straight, knee bent. Do in chair with back to it _10__ reps per set, _2__ sets per day, _5__ days per week  Copyright  VHI. All rights reserved.  EXTENSION: Sitting (Active)    Sit with feet flat. Straighten right knee. Complete _10__ sets of _2__ repetitions. Perform _1-2__ sessions per day.  http://gtsc.exer.us/269   Copyright  VHI. All rights reserved.  ABDUCTION: Sitting (Active)    Sit with feet flat. Lift right leg slightly and draw it out to side. Complete _2__ sets of _8__ repetitions. Perform _2__ sessions per day.  Copyright  VHI. All rights reserved.   Take rests as needed. Do not make your leg so tired that you will be unsafe when walking later. If anything is painful, stop exercise and let the physical therapist know.

## 2015-12-18 ENCOUNTER — Ambulatory Visit: Payer: Managed Care, Other (non HMO)

## 2015-12-18 VITALS — BP 127/78 | HR 78

## 2015-12-18 DIAGNOSIS — R2689 Other abnormalities of gait and mobility: Secondary | ICD-10-CM

## 2015-12-18 DIAGNOSIS — R2681 Unsteadiness on feet: Secondary | ICD-10-CM

## 2015-12-18 DIAGNOSIS — M6281 Muscle weakness (generalized): Secondary | ICD-10-CM | POA: Diagnosis not present

## 2015-12-18 NOTE — Therapy (Signed)
[place] MAIN [facility] SERVICES [address] [place], [place], [address] Phone: [phone]   Fax:  [phone]  Physical Therapy Treatment  Patient Details  Name: Lori Larsen MRN: [id number] Date of Birth: 25-Aug-1965 Referring Provider: Anabel Bene, MD  Encounter Date: 12/18/2015      PT End of Session - 12/18/15 1307    Visit Number 3   Number of Visits 12   Date for PT Re-Evaluation 01/14/16   PT Start Time 0802   PT Stop Time 0844   PT Time Calculation (min) 42 min   Equipment Utilized During Treatment Gait belt   Activity Tolerance Patient tolerated treatment well   Behavior During Therapy [facility] for tasks assessed/performed      Past Medical History:  Diagnosis Date  . Diabetes mellitus without complication ([place])   . Hypertension     Past Surgical History:  Procedure Laterality Date  . CESAREAN SECTION      Vitals:   12/18/15 0805  BP: 127/78  Pulse: 78        Subjective Assessment - 12/18/15 0808    Subjective Pt reports no new issues and is progressing with increasing her strength. States she's been performing HEP.    Pertinent History 10/03/15 while at work started to feel numbness and tingling in her hand which transformed into right sided weakness and loss of sensation. Went to ER stayed overnight and went home. Nurse visitations stopped due to increase expense.    Limitations Standing;Walking   How long can you stand comfortably? 30 before fatigue   Patient Stated Goals Patient states she wants to feel better and return to work.    Currently in Pain? No/denies      Treatment: -Warm up on NuStep, L2 x3 min. BUE/LE; unbilled  Ther ex: -Seated hip abduction, x25, min VCs for initial set up, RTB resistance, min VCs for initial set up and resistance difficulty -Leg press with cueing for initial set up and knee positioning - 2 x 10 #90 -Heel/toe raises, 2 x 20 (x20 on airex pad) (x20 off airex max), min  VCs to increase ROM into PF/DF;  --Sit to stands from mat table at normal chair height with 1.5# bar weight into shoulder flexion, 2x12, min VCs for pre-positioning before activity   Neuromuscular Rehab: -Static stand on airex pad x30 seconds, x10 horzitonal and vertical head movements, -Tandem stance on airex pad - x30 sec bilaterally -Marches on airex pad, 2x20, min VCs to hold on in order to increase ROM, ,  -Forward/backward tandem walking on airex balance beam, x5 down and back, with 1 knee buckling, minA otherwise for safety, very unsteady, min VCs to use hands prn, 0-1 HHA,  -Lateral walking on airex beam - x 5 down and back, no resistance, 2 HHA, CGA for safety, min VCs for initial set up of exercise.        PT Education - 12/18/15 1307    Education provided Yes   Education Details Educated on proper form and technique with exercise performance   Person(s) Educated Patient   Methods Explanation;Demonstration   Comprehension Verbalized understanding;Returned demonstration             PT Long Term Goals - 12/03/15 1651      PT LONG TERM GOAL #1   Title Pt will score <15 sec on the 5xSTS to demonstrate functional improvement in lower extremity strength and allow for improved about to rise out of a chair  Baseline 17.64sec   Time 6   Period Weeks   Status New     PT LONG TERM GOAL #2   Title Pt will score a >1.82m/s on 21m walk test to demonstrate significant improvement in ambulation speed and decrease in fall risk   Baseline .22m/s   Time 6   Period Weeks   Status New     PT LONG TERM GOAL #3   Title Patient will score a 48/56 on the BERG balance scale to demonstrate significant improvement in fall risk and improved ability to safely ambulate.   Baseline 38/56   Time 6   Period Weeks   Status New               Plan - 12/18/15 1308    Clinical Impression Statement Pt demonstrates significant improvement with balancing and strengthening exercises  indicating functional carryover between visits. Although patient is improving, she continues to require aide with single leg stance based exercise indicating decreased stabilization and balance when exercise performance. Patient will benefit from further skilled therapy focused on improving limitations to return to prior level of function.    Rehab Potential Good   Clinical Impairments Affecting Rehab Potential (+) family support (-) financial situation   PT Frequency 2x / week   PT Duration 6 weeks   PT Treatment/Interventions ADLs/Self Care Home Management;Cryotherapy;Electrical Stimulation;Ultrasound;Moist Heat;Gait training;Stair training;Therapeutic activities;Therapeutic exercise;Neuromuscular re-education;Patient/family education;Passive range of motion;Manual techniques   PT Next Visit Plan 6 min walk test, balancing measures   PT Home Exercise Plan sit to stands   Consulted and Agree with Plan of Care Patient      Patient will benefit from skilled therapeutic intervention in order to improve the following deficits and impairments:  Abnormal gait, Decreased mobility, Decreased coordination, Increased muscle spasms, Impaired UE functional use, Decreased strength, Decreased range of motion, Decreased endurance, Impaired sensation, Difficulty walking, Decreased balance  Visit Diagnosis: Muscle weakness (generalized)  Unsteadiness on feet  Balance disorder     Problem List Patient Active Problem List   Diagnosis Date Noted  . CVA (cerebral infarction) 10/03/2015    Blythe Stanford, PT DPT 12/18/2015, 1:12 PM  [place] MAIN [facility] SERVICES [address] [place], [place], [address] Phone: [phone]   Fax:  [phone]  Name: Lori Larsen MRN: [id number] Date of Birth: [birth year]

## 2015-12-20 ENCOUNTER — Ambulatory Visit: Payer: Managed Care, Other (non HMO) | Admitting: Physical Therapy

## 2015-12-20 ENCOUNTER — Encounter: Payer: Self-pay | Admitting: Physical Therapy

## 2015-12-20 DIAGNOSIS — R2681 Unsteadiness on feet: Secondary | ICD-10-CM

## 2015-12-20 DIAGNOSIS — M6281 Muscle weakness (generalized): Secondary | ICD-10-CM | POA: Diagnosis not present

## 2015-12-20 NOTE — Therapy (Signed)
[place] MAIN [facility] SERVICES [address] [place], [place], [address] Phone: [phone]   Fax:  [phone]  Physical Therapy Treatment  Patient Details  Name: Lori Larsen MRN: [id number] Date of Birth: 03/11/1965 Referring Provider: Anabel Bene, MD  Encounter Date: 12/20/2015      PT End of Session - 12/20/15 1353    Visit Number 4   Number of Visits 12   Date for PT Re-Evaluation 01/14/16   PT Start Time 0145   PT Stop Time 0230   PT Time Calculation (min) 45 min   Equipment Utilized During Treatment Gait belt   Activity Tolerance Patient tolerated treatment well   Behavior During Therapy [facility] for tasks assessed/performed      Past Medical History:  Diagnosis Date  . Diabetes mellitus without complication ([place])   . Hypertension     Past Surgical History:  Procedure Laterality Date  . CESAREAN SECTION      There were no vitals filed for this visit.      Subjective Assessment - 12/20/15 1353    Subjective Pt reports no new issues and is progressing with increasing her strength. States she's been performing HEP.    Pertinent History 10/03/15 while at work started to feel numbness and tingling in her hand which transformed into right sided weakness and loss of sensation. Went to ER stayed overnight and went home. Nurse visitations stopped due to increase expense.    Limitations Standing;Walking   How long can you stand comfortably? 30 before fatigue   Patient Stated Goals Patient states she wants to feel better and return to work.         NEUROMUSCULAR RE-ED 1/2 foam curve side down without UE Toe taps on BOSU x 10 bilateral, alternating; Modified tandem stance eyes open/closed x 30 seconds each alternating LE; Modified tandem stance eyes open with horizontal and vertical head turns alternating Tandem stand with head turns x 2 minutes Stepping onto AIREX, then on to 4 inch step followed by stepping down onto  AIREX and then level surface in //bars x15.  Pt required occasional UE assist, and performance improved with each repetition   Stepping over and back x10 bilaterally   Side step and back x10 bilaterally  Standing on airex: Alternate toe taps on BOSU without rail assist x15 bilaterally with min VCs to utilize mirror for better visual cues; One foot on airex, one foot on BOSU x10 each direction with min A for balance and mod VCs to utilize mirror to improve trunk control for less lateral loss of balance;   Therapeutic exercise; Leg press 90 lbs x 10 x 3, heel raises 10 x 3   Patient needs occasional verbal cueing to improve posture and cueing to correctly perform exercises slowly, holding at end of range to increase motor firing of desired muscle to encourage fatigue.                          PT Education - 12/20/15 1353    Education provided Yes   Education Details HEP   Person(s) Educated Patient   Methods Explanation   Comprehension Verbalized understanding             PT Long Term Goals - 12/03/15 1651      PT LONG TERM GOAL #1   Title Pt will score <15 sec on the 5xSTS to demonstrate functional improvement in lower extremity strength and allow  for improved about to rise out of a chair   Baseline 17.64sec   Time 6   Period Weeks   Status New     PT LONG TERM GOAL #2   Title Pt will score a >1.67m/s on 101m walk test to demonstrate significant improvement in ambulation speed and decrease in fall risk   Baseline .21m/s   Time 6   Period Weeks   Status New     PT LONG TERM GOAL #3   Title Patient will score a 48/56 on the BERG balance scale to demonstrate significant improvement in fall risk and improved ability to safely ambulate.   Baseline 38/56   Time 6   Period Weeks   Status New               Plan - 12/20/15 1354    Clinical Impression Statement  SBA used throughout with increased in postural sway and LOB most seen with weight  shifting activities. CGA needed for head turns and trunk rotation to maintain balance and upright posture., UE support and CGA needed throughout Airex step up task.   Rehab Potential Good   Clinical Impairments Affecting Rehab Potential (+) family support (-) financial situation   PT Frequency 2x / week   PT Duration 6 weeks   PT Treatment/Interventions ADLs/Self Care Home Management;Cryotherapy;Electrical Stimulation;Ultrasound;Moist Heat;Gait training;Stair training;Therapeutic activities;Therapeutic exercise;Neuromuscular re-education;Patient/family education;Passive range of motion;Manual techniques   PT Next Visit Plan 6 min walk test, balancing measures   PT Home Exercise Plan sit to stands   Consulted and Agree with Plan of Care Patient      Patient will benefit from skilled therapeutic intervention in order to improve the following deficits and impairments:  Abnormal gait, Decreased mobility, Decreased coordination, Increased muscle spasms, Impaired UE functional use, Decreased strength, Decreased range of motion, Decreased endurance, Impaired sensation, Difficulty walking, Decreased balance  Visit Diagnosis: Muscle weakness (generalized)  Unsteadiness on feet     Problem List Patient Active Problem List   Diagnosis Date Noted  . CVA (cerebral infarction) 10/03/2015    Alanson Puls 12/20/2015, 1:55 PM  [place] MAIN [facility] SERVICES [address] [place], [place], [address] Phone: [phone]   Fax:  [phone]  Name: Lori Larsen MRN: [id number] Date of Birth: [birth year]

## 2015-12-25 ENCOUNTER — Ambulatory Visit: Payer: Managed Care, Other (non HMO)

## 2015-12-25 DIAGNOSIS — R2689 Other abnormalities of gait and mobility: Secondary | ICD-10-CM

## 2015-12-25 DIAGNOSIS — M6281 Muscle weakness (generalized): Secondary | ICD-10-CM | POA: Diagnosis not present

## 2015-12-25 DIAGNOSIS — R2681 Unsteadiness on feet: Secondary | ICD-10-CM

## 2015-12-25 NOTE — Therapy (Signed)
[place] MAIN [facility] SERVICES [address] [place], [place], [address] Phone: [phone]   Fax:  [phone]  Physical Therapy Treatment  Patient Details  Name: CYRIL CALENDER MRN: [id number] Date of Birth: 01/29/1966 Referring Provider: Anabel Bene, MD  Encounter Date: 12/25/2015      PT End of Session - 12/25/15 1512    Visit Number 5   Number of Visits 12   Date for PT Re-Evaluation 01/14/16   PT Start Time 1430   PT Stop Time 1515   PT Time Calculation (min) 45 min   Equipment Utilized During Treatment Gait belt   Activity Tolerance Patient tolerated treatment well   Behavior During Therapy [facility] for tasks assessed/performed      Past Medical History:  Diagnosis Date  . Diabetes mellitus without complication ([place])   . Hypertension     Past Surgical History:  Procedure Laterality Date  . CESAREAN SECTION      There were no vitals filed for this visit.      Subjective Assessment - 12/25/15 1433    Subjective Pt reports a fall the previous saturday when walking across the grass at a family picnic. States she tripped over a wire that was placed in the yard. Denies any adverse reactions such as LOC, diplopia, dysarthria, or injury.    Pertinent History 10/03/15 while at work started to feel numbness and tingling in her hand which transformed into right sided weakness and loss of sensation. Went to ER stayed overnight and went home. Nurse visitations stopped due to increase expense.    Limitations Standing;Walking   How long can you stand comfortably? 30 before fatigue   Patient Stated Goals Patient states she wants to feel better and return to work.    Currently in Pain? No/denies      NEUROMUSCULAR RE-ED Modified tandem stance eyes closed x 30 seconds each alternating LE; Modified tandem stance eyes open with horizontal and vertical head turns alternating - 2 x 15 Side stepping up and over Bosu pad - x15   Standing  on airex: Alternate toe taps on BOSU without rail assist x15 bilaterally with min VCs to utilize mirror for better visual cues; Tandem stance on airex pad - 2 x 30 sec bilaterally Mini lunges onto bosu from airex pad - x15 Marches on airex - x 10, x10 on Bosu with UE support  Therapeutic exercise; NuStep: seat level 5; level 3 resistance, 40min Leg press -- 105 lbs 2 x 10 , heel raises 90# 2 x15 Glute squeeze/ ball squeeze - x20   Patient needs occasional verbal cueing to improve posture and cueing to correctly perform exercises slowly, holding at end of range to increase motor firing of desired muscle to encourage fatigue.          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) Educated Patient   Methods Explanation;Demonstration   Comprehension Verbalized understanding             PT Long Term Goals - 12/03/15 1651      PT LONG TERM GOAL #1   Title Pt will score <15 sec on the 5xSTS to demonstrate functional improvement in lower extremity strength and allow for improved about to rise out of a chair   Baseline 17.64sec   Time 6   Period Weeks   Status New     PT  LONG TERM GOAL #2   Title Pt will score a >1.8m/s on 29m walk test to demonstrate significant improvement in ambulation speed and decrease in fall risk   Baseline .71m/s   Time 6   Period Weeks   Status New     PT LONG TERM GOAL #3   Title Patient will score a 48/56 on the BERG balance scale to demonstrate significant improvement in fall risk and improved ability to safely ambulate.   Baseline 38/56   Time 6   Period Weeks   Status New               Plan - 12/25/15 1513    Clinical Impression Statement Pt demonstrates improved static balance today requiring less UE and demonstrates decreased postural sway indicating functional carryover between visits. Although patient is improving, she continues to demonstrate  decreased muscular strength and balance and pt will benefit from further skilled therapy to return to prior level of function.    Rehab Potential Good   Clinical Impairments Affecting Rehab Potential (+) family support (-) financial situation   PT Frequency 2x / week   PT Duration 6 weeks   PT Treatment/Interventions ADLs/Self Care Home Management;Cryotherapy;Electrical Stimulation;Ultrasound;Moist Heat;Gait training;Stair training;Therapeutic activities;Therapeutic exercise;Neuromuscular re-education;Patient/family education;Passive range of motion;Manual techniques   PT Next Visit Plan 6 min walk test, balancing measures   PT Home Exercise Plan sit to stands   Consulted and Agree with Plan of Care Patient      Patient will benefit from skilled therapeutic intervention in order to improve the following deficits and impairments:  Abnormal gait, Decreased mobility, Decreased coordination, Increased muscle spasms, Impaired UE functional use, Decreased strength, Decreased range of motion, Decreased endurance, Impaired sensation, Difficulty walking, Decreased balance  Visit Diagnosis: Muscle weakness (generalized)  Unsteadiness on feet  Balance disorder     Problem List Patient Active Problem List   Diagnosis Date Noted  . CVA (cerebral infarction) 10/03/2015    Blythe Stanford, PT DPT 12/25/2015, 5:17 PM  [place] MAIN [facility] SERVICES [address] [place], [place], [address] Phone: [phone]   Fax:  [phone]  Name: MABELINE HINGSON MRN: [id number] Date of Birth: [birth year]

## 2015-12-27 ENCOUNTER — Encounter: Payer: Self-pay | Admitting: Physical Therapy

## 2015-12-27 ENCOUNTER — Ambulatory Visit: Payer: Managed Care, Other (non HMO) | Admitting: Physical Therapy

## 2015-12-27 DIAGNOSIS — R2681 Unsteadiness on feet: Secondary | ICD-10-CM

## 2015-12-27 DIAGNOSIS — M6281 Muscle weakness (generalized): Secondary | ICD-10-CM

## 2015-12-27 NOTE — Therapy (Addendum)
[place] MAIN [facility] SERVICES [address] [place], [place], [address] Phone: [phone]   Fax:  [phone]  Physical Therapy Treatment  Patient Details  Name: Lori Larsen MRN: [id number] Date of Birth: [birth year] Referring Provider: Anabel Bene, MD  Encounter Date: 12/27/2015      PT End of Session - 12/27/15 1711    Visit Number 6   Number of Visits 12   Date for PT Re-Evaluation 01/14/16   PT Start Time [id number]   PT Stop Time 1750   PT Time Calculation (min) 40 min   Equipment Utilized During Treatment Gait belt   Activity Tolerance Patient tolerated treatment well   Behavior During Therapy [facility] for tasks assessed/performed      Past Medical History:  Diagnosis Date  . Diabetes mellitus without complication ([place])   . Hypertension     Past Surgical History:  Procedure Laterality Date  . CESAREAN SECTION      There were no vitals filed for this visit.      Subjective Assessment - 12/27/15 1710    Subjective Patient reports that she doe not feel well today just blah.    Pertinent History 10/03/15 while at work started to feel numbness and tingling in her hand which transformed into right sided weakness and loss of sensation. Went to ER stayed overnight and went home. Nurse visitations stopped due to increase expense.    Limitations Standing;Walking   How long can you stand comfortably? 30 before fatigue   Patient Stated Goals Patient states she wants to feel better and return to work.    Currently in Pain? No/denies   Multiple Pain Sites No          Neuromuscular Re-education   Marching in place on blue foam pad x 30 seconds for 2 sets   Tandem standing in // bars  x 1 minute Feet together on blue foam with head turns Toe taps on AIREX + step 3x10 no Ue Fwd step up onto 6inch step from AIREX no UE TM walking elevation 2 at . 7 m/hr x 5 minutes Leg press 90 lbs 20 x 2, heel raises x 20 x 2 CGA and Min to  mod verbal cues used throughout with increased in postural sway and LOB most seen with narrow base of support and while on uneven surfaces. Continues to have balance deficits typical with diagnosis. Patient performs intermediate level exercises without pain behaviors            PT Education - 12/27/15 1711    Education provided Yes   Education Details HEP    Person(s) Educated Patient   Methods Explanation   Comprehension Verbalized understanding             PT Long Term Goals - 12/03/15 1651      PT LONG TERM GOAL #1   Title Pt will score <15 sec on the 5xSTS to demonstrate functional improvement in lower extremity strength and allow for improved about to rise out of a chair   Baseline 17.64sec   Time 6   Period Weeks   Status New     PT LONG TERM GOAL #2   Title Pt will score a >1.29m/s on 50m walk test to demonstrate significant improvement in ambulation speed and decrease in fall risk   Baseline .38m/s   Time 6   Period Weeks   Status New     PT LONG TERM GOAL #3  Title Patient will score a 48/56 on the BERG balance scale to demonstrate significant improvement in fall risk and improved ability to safely ambulate.   Baseline 38/56   Time 6   Period Weeks   Status New               Plan - 12/27/15 1711    Clinical Impression Statement Min cuing needed to maintain posture while performing gait training and balance exercises.    Rehab Potential Good   Clinical Impairments Affecting Rehab Potential (+) family support (-) financial situation   PT Frequency 2x / week   PT Duration 6 weeks   PT Treatment/Interventions ADLs/Self Care Home Management;Cryotherapy;Electrical Stimulation;Ultrasound;Moist Heat;Gait training;Stair training;Therapeutic activities;Therapeutic exercise;Neuromuscular re-education;Patient/family education;Passive range of motion;Manual techniques   PT Next Visit Plan 6 min walk test, balancing measures   PT Home Exercise Plan sit to  stands   Consulted and Agree with Plan of Care Patient      Patient will benefit from skilled therapeutic intervention in order to improve the following deficits and impairments:  Abnormal gait, Decreased mobility, Decreased coordination, Increased muscle spasms, Impaired UE functional use, Decreased strength, Decreased range of motion, Decreased endurance, Impaired sensation, Difficulty walking, Decreased balance  Visit Diagnosis: Muscle weakness (generalized)  Unsteadiness on feet     Problem List Patient Active Problem List   Diagnosis Date Noted  . CVA (cerebral infarction) 10/03/2015    Alanson Puls 12/27/2015, 5:13 PM  [place] MAIN [facility] SERVICES [address] [place], [place], [address] Phone: [phone]   Fax:  [phone]  Name: Lori Larsen MRN: [id number] Date of Birth: 04/23/1965

## 2015-12-27 NOTE — Therapy (Signed)
[place] MAIN [facility] SERVICES [address] [place], [place], [address] Phone: [phone]   Fax:  [phone]  Physical Therapy Treatment  Patient Details  Name: Lori Larsen MRN: [id number] Date of Birth: August 20, 1965 Referring Provider: Anabel Bene, MD  Encounter Date: 12/27/2015      PT End of Session - 12/27/15 1711    Visit Number 6   Number of Visits 12   Date for PT Re-Evaluation 01/14/16   PT Start Time [id number]   PT Stop Time 1750   PT Time Calculation (min) 40 min   Equipment Utilized During Treatment Gait belt   Activity Tolerance Patient tolerated treatment well   Behavior During Therapy [facility] for tasks assessed/performed      Past Medical History:  Diagnosis Date  . Diabetes mellitus without complication ([place])   . Hypertension     Past Surgical History:  Procedure Laterality Date  . CESAREAN SECTION      There were no vitals filed for this visit.      Subjective Assessment - 12/27/15 1710    Subjective Patient reports that she doe not feel well today just blah.    Pertinent History 10/03/15 while at work started to feel numbness and tingling in her hand which transformed into right sided weakness and loss of sensation. Went to ER stayed overnight and went home. Nurse visitations stopped due to increase expense.    Limitations Standing;Walking   How long can you stand comfortably? 30 before fatigue   Patient Stated Goals Patient states she wants to feel better and return to work.    Currently in Pain? No/denies   Multiple Pain Sites No                                 PT Education - 12/27/15 1711    Education provided Yes   Education Details HEP    Person(s) Educated Patient   Methods Explanation   Comprehension Verbalized understanding             PT Long Term Goals - 12/03/15 1651      PT LONG TERM GOAL #1   Title Pt will score <15 sec on the 5xSTS to demonstrate  functional improvement in lower extremity strength and allow for improved about to rise out of a chair   Baseline 17.64sec   Time 6   Period Weeks   Status New     PT LONG TERM GOAL #2   Title Pt will score a >1.44m/s on 76m walk test to demonstrate significant improvement in ambulation speed and decrease in fall risk   Baseline .78m/s   Time 6   Period Weeks   Status New     PT LONG TERM GOAL #3   Title Patient will score a 48/56 on the BERG balance scale to demonstrate significant improvement in fall risk and improved ability to safely ambulate.   Baseline 38/56   Time 6   Period Weeks   Status New               Plan - 12/27/15 1711    Clinical Impression Statement Min cuing needed to maintain posture while performing gait training and balance exercises.    Rehab Potential Good   Clinical Impairments Affecting Rehab Potential (+) family support (-) financial situation   PT Frequency 2x / week   PT Duration 6 weeks  PT Treatment/Interventions ADLs/Self Care Home Management;Cryotherapy;Electrical Stimulation;Ultrasound;Moist Heat;Gait training;Stair training;Therapeutic activities;Therapeutic exercise;Neuromuscular re-education;Patient/family education;Passive range of motion;Manual techniques   PT Next Visit Plan 6 min walk test, balancing measures   PT Home Exercise Plan sit to stands   Consulted and Agree with Plan of Care Patient      Patient will benefit from skilled therapeutic intervention in order to improve the following deficits and impairments:  Abnormal gait, Decreased mobility, Decreased coordination, Increased muscle spasms, Impaired UE functional use, Decreased strength, Decreased range of motion, Decreased endurance, Impaired sensation, Difficulty walking, Decreased balance  Visit Diagnosis: Muscle weakness (generalized)  Unsteadiness on feet     Problem List Patient Active Problem List   Diagnosis Date Noted  . CVA (cerebral infarction)  10/03/2015   Alanson Puls, PT, DPT Arelia Sneddon S 12/27/2015, 5:47 PM  [place] MAIN [facility] SERVICES [address] [place], [place], [address] Phone: [phone]   Fax:  [phone]  Name: Lori Larsen MRN: [id number] Date of Birth: 11/21/1965

## 2016-01-01 ENCOUNTER — Ambulatory Visit: Payer: Managed Care, Other (non HMO)

## 2016-01-03 ENCOUNTER — Ambulatory Visit: Payer: Managed Care, Other (non HMO) | Admitting: Physical Therapy

## 2016-01-03 ENCOUNTER — Encounter: Payer: Self-pay | Admitting: Physical Therapy

## 2016-01-03 VITALS — BP 147/87 | HR 77

## 2016-01-03 DIAGNOSIS — M6281 Muscle weakness (generalized): Secondary | ICD-10-CM

## 2016-01-03 DIAGNOSIS — R2689 Other abnormalities of gait and mobility: Secondary | ICD-10-CM

## 2016-01-03 DIAGNOSIS — R2681 Unsteadiness on feet: Secondary | ICD-10-CM

## 2016-01-03 NOTE — Therapy (Signed)
[place] MAIN [facility] SERVICES [address] [place], [place], [address] Phone: [phone]   Fax:  [phone]  Physical Therapy Treatment  Patient Details  Name: Lori Larsen MRN: [id number] Date of Birth: Jun 26, 1965 Referring Provider: Anabel Bene, MD  Encounter Date: 01/03/2016      PT End of Session - 01/03/16 1056    Visit Number 7   Number of Visits 12   Date for PT Re-Evaluation 01/14/16   PT Start Time [id number]   PT Stop Time 1058   PT Time Calculation (min) 43 min   Equipment Utilized During Treatment Gait belt   Activity Tolerance Patient tolerated treatment well   Behavior During Therapy [facility] for tasks assessed/performed      Past Medical History:  Diagnosis Date  . Diabetes mellitus without complication ([place])   . Hypertension     Past Surgical History:  Procedure Laterality Date  . CESAREAN SECTION      Vitals:   01/03/16 1017  BP: (!) 147/87  Pulse: 77  SpO2: 100%        Subjective Assessment - 01/03/16 1008    Subjective Pt reports she is feeling much better today than last session and reports no new falls.     Pertinent History 10/03/15 while at work started to feel numbness and tingling in her hand which transformed into right sided weakness and loss of sensation. Went to ER stayed overnight and went home. Nurse visitations stopped due to increase expense.    Limitations Standing;Walking   How long can you stand comfortably? 30 before fatigue   Patient Stated Goals Patient states she wants to feel better and return to work.    Currently in Pain? No/denies       Treatment Nustep x 4 mins BUEs/BLEs level 2 (unbilled)  Resisted walking forwards/backwards/sideways x 2 laps each direction, CGA for safety, #7.5, min VCs to increase step length and lean in opposite direction of pull  Bosu step ups with 4 marches on ball each repetition, 1 set x 10 reps with 4 marches on top of ball, no HHA, CGA for  stability, min VCs for increased glut activation and upright posture  Sidestepping with ball toss on airex balance beam, x 4 laps, CGA for safety, min VCs for glut activation and slow controlled steps Tandem walking on balance beam forwards/backwards x 2 laps each, no HHA, min VCs to keep gaze forward  Tandem stance with front foot on yellow dyna disc and back foot on half bolster with ball toss x 12 tosses leading with either foot in front, CGA for security, one LOB corrected by PT  Scapular retractions standing on blue half bolster with yellow theraband resistance, min A for stability, min tactile cures for greater retraction, 2 sets x 10 reps, min A to correct LOB Leg Press, BLEs, #105, 2 sets x 10 reps, min VCs for increased eccentric control  Leg Press, RLE, #60, 1 set x 10 reps, min VCs to decrease terminal knee extension  Heel raises on leg press, #60, 2 sets x 10 reps, min VCs to keep knees straight throughout                           PT Education - 01/03/16 1055    Education provided Yes   Education Details speaking to MD about returning back to work    [employer]) Educated Patient   Methods Explanation;Demonstration;Verbal cues  Comprehension Verbalized understanding;Returned demonstration;Verbal cues required             PT Long Term Goals - 12/03/15 1651      PT LONG TERM GOAL #1   Title Pt will score <15 sec on the 5xSTS to demonstrate functional improvement in lower extremity strength and allow for improved about to rise out of a chair   Baseline 17.64sec   Time 6   Period Weeks   Status New     PT LONG TERM GOAL #2   Title Pt will score a >1.52m/s on 37m walk test to demonstrate significant improvement in ambulation speed and decrease in fall risk   Baseline .34m/s   Time 6   Period Weeks   Status New     PT LONG TERM GOAL #3   Title Patient will score a 48/56 on the BERG balance scale to demonstrate significant improvement in fall risk and  improved ability to safely ambulate.   Baseline 38/56   Time 6   Period Weeks   Status New               Plan - 01/03/16 1104    Clinical Impression Statement Pt reported she was feeling much better than previous session.  She was challenged by high level balance activites with marches on top of bosu ball and scapular retractions while standing on half bolster with several LOB corrected by PT.  Continued leg press single and double leg to isolate quad strength, pt demonstrated adequate technique throughout.  She would continue to benefit from further skilled PT to work on dynamic balance and LE strength to progress towards greater functional mobility.     Rehab Potential Good   Clinical Impairments Affecting Rehab Potential (+) family support (-) financial situation   PT Frequency 2x / week   PT Duration 6 weeks   PT Treatment/Interventions ADLs/Self Care Home Management;Cryotherapy;Electrical Stimulation;Ultrasound;Moist Heat;Gait training;Stair training;Therapeutic activities;Therapeutic exercise;Neuromuscular re-education;Patient/family education;Passive range of motion;Manual techniques   PT Next Visit Plan 6 min walk test, balancing measures   PT Home Exercise Plan sit to stands   Consulted and Agree with Plan of Care Patient      Patient will benefit from skilled therapeutic intervention in order to improve the following deficits and impairments:  Abnormal gait, Decreased mobility, Decreased coordination, Increased muscle spasms, Impaired UE functional use, Decreased strength, Decreased range of motion, Decreased endurance, Impaired sensation, Difficulty walking, Decreased balance  Visit Diagnosis: Muscle weakness (generalized)  Unsteadiness on feet  Balance disorder     Problem List Patient Active Problem List   Diagnosis Date Noted  . CVA (cerebral infarction) 10/03/2015   Stacy Gardner, SPT  This entire session was performed under direct supervision and  direction of a licensed therapist/therapist assistant . I have personally read, edited and approve of the note as written.  Trotter,Margaret PT, DPT 01/03/2016, 2:37 PM  [place] MAIN [facility] SERVICES [address] [place], [place], [address] Phone: [phone]   Fax:  [phone]  Name: Lori Larsen MRN: [id number] Date of Birth: 1965/11/14

## 2016-01-08 ENCOUNTER — Ambulatory Visit: Payer: Managed Care, Other (non HMO)

## 2016-01-08 DIAGNOSIS — M6281 Muscle weakness (generalized): Secondary | ICD-10-CM

## 2016-01-08 DIAGNOSIS — R2681 Unsteadiness on feet: Secondary | ICD-10-CM

## 2016-01-08 NOTE — Therapy (Signed)
[place] MAIN [facility] SERVICES [address] [place], [place], [address] Phone: [phone]   Fax:  [phone]  Physical Therapy Treatment  Patient Details  Name: Lori Larsen MRN: [id number] Date of Birth: 25-May-1965 Referring Provider: Anabel Bene, MD  Encounter Date: 01/08/2016      PT End of Session - 01/08/16 1558    Visit Number 8   Number of Visits 12   Date for PT Re-Evaluation 01/14/16   PT Start Time [id number]   PT Stop Time 1600   PT Time Calculation (min) 45 min   Equipment Utilized During Treatment Gait belt   Activity Tolerance Patient tolerated treatment well   Behavior During Therapy [facility] for tasks assessed/performed      Past Medical History:  Diagnosis Date  . Diabetes mellitus without complication ([place])   . Hypertension     Past Surgical History:  Procedure Laterality Date  . CESAREAN SECTION      There were no vitals filed for this visit.      Subjective Assessment - 01/08/16 1555    Subjective Pt reports no current complaints and is interested in returning to work. States she feels much stronger   Pertinent History 10/03/15 while at work started to feel numbness and tingling in her hand which transformed into right sided weakness and loss of sensation. Went to ER stayed overnight and went home. Nurse visitations stopped due to increase expense.    Limitations Standing;Walking   How long can you stand comfortably? 30 before fatigue   Patient Stated Goals Patient states she wants to feel better and return to work.    Currently in Pain? No/denies       Treatment Nustep x 4 mins BUEs/BLEs level 2 (unbilled)  -Scapular retractions standing on blue airex with feet together- 2 x 20 at Matrix 12.5 # -Standing feet together on airex with 5# body and arm turns - up/down, left/right -Semi tandem stance on a blue airex pad - x 20 bilateral Les cueing on foot positioning  -Sidestepping with ball toss on airex  balance beam, x 6 laps, CGA for safety, min VCs for glut activation and slow controlled steps -Tandem walking on balance beam and half foam forwards x 4 laps each, no HHA, min VCs to keep gaze forward  -Heel raises off of 1" step, 2 sets x 15 reps, for gastrocnemius strength/endurance -Standing Hip abduction in standing off of airex pad - 2 x 15        PT Education - 01/08/16 1556    Education provided Yes   Education Details Instructed on occupational duties and required activities to return to work   [employer]) Educated Patient   Methods Explanation;Demonstration   Comprehension Verbalized understanding;Returned demonstration             PT Long Term Goals - 12/03/15 1651      PT LONG TERM GOAL #1   Title Pt will score <15 sec on the 5xSTS to demonstrate functional improvement in lower extremity strength and allow for improved about to rise out of a chair   Baseline 17.64sec   Time 6   Period Weeks   Status New     PT LONG TERM GOAL #2   Title Pt will score a >1.83m/s on 61m walk test to demonstrate significant improvement in ambulation speed and decrease in fall risk   Baseline .47m/s   Time 6   Period Weeks   Status New  PT LONG TERM GOAL #3   Title Patient will score a 48/56 on the BERG balance scale to demonstrate significant improvement in fall risk and improved ability to safely ambulate.   Baseline 38/56   Time 6   Period Weeks   Status New               Plan - 01/08/16 1600    Clinical Impression Statement Pt reports she uses her hands often for work and focused on performing balancing and ambulating exercises to mimicking occupational duties. Patient demonstrates improved balance with less postural sway with exercises indicating funcitonal carryover between visits. Although patient is improving, she continues to demosntrate decreased mucular coordination/endurance and will benefit from further skilled therapy to return to prior level of function.     Rehab Potential Good   Clinical Impairments Affecting Rehab Potential (+) family support (-) financial situation   PT Frequency 2x / week   PT Duration 6 weeks   PT Treatment/Interventions ADLs/Self Care Home Management;Cryotherapy;Electrical Stimulation;Ultrasound;Moist Heat;Gait training;Stair training;Therapeutic activities;Therapeutic exercise;Neuromuscular re-education;Patient/family education;Passive range of motion;Manual techniques   PT Next Visit Plan 6 min walk test, balancing measures   PT Home Exercise Plan sit to stands   Consulted and Agree with Plan of Care Patient      Patient will benefit from skilled therapeutic intervention in order to improve the following deficits and impairments:  Abnormal gait, Decreased mobility, Decreased coordination, Increased muscle spasms, Impaired UE functional use, Decreased strength, Decreased range of motion, Decreased endurance, Impaired sensation, Difficulty walking, Decreased balance  Visit Diagnosis: Muscle weakness (generalized)  Unsteadiness on feet     Problem List Patient Active Problem List   Diagnosis Date Noted  . CVA (cerebral infarction) 10/03/2015    Blythe Stanford, PT DPT 01/08/2016, 6:13 PM  [place] MAIN [facility] SERVICES [address] [place], [place], [address] Phone: [phone]   Fax:  [phone]  Name: Lori Larsen MRN: [id number] Date of Birth: 1965-11-09

## 2016-01-10 ENCOUNTER — Ambulatory Visit: Payer: Managed Care, Other (non HMO) | Attending: Neurology

## 2016-01-10 DIAGNOSIS — R2681 Unsteadiness on feet: Secondary | ICD-10-CM | POA: Diagnosis present

## 2016-01-10 DIAGNOSIS — M6281 Muscle weakness (generalized): Secondary | ICD-10-CM | POA: Insufficient documentation

## 2016-01-10 NOTE — Therapy (Signed)
[place] MAIN [facility] SERVICES [address] [place], [place], [address] Phone: [phone]   Fax:  [phone]  Physical Therapy Treatment/Discharge Summary  Patient Details  Name: Lori Larsen MRN: [id number] Date of Birth: 02-Aug-1965 Referring Provider: Anabel Bene, MD  Encounter Date: 01/10/2016   Patient has attended 9 out of 9 session from 12/03/15 to 01/10/16. Patient has met all long term goals and is to be discharged from physical therapy.       PT End of Session - 01/10/16 1021    Visit Number 9   Number of Visits 12   Date for PT Re-Evaluation 01/14/16   PT Start Time 1018   PT Stop Time 1100   PT Time Calculation (min) 42 min   Equipment Utilized During Treatment Gait belt   Activity Tolerance Patient tolerated treatment well   Behavior During Therapy [facility] for tasks assessed/performed      Past Medical History:  Diagnosis Date  . Diabetes mellitus without complication ([place])   . Hypertension     Past Surgical History:  Procedure Laterality Date  . CESAREAN SECTION      There were no vitals filed for this visit.      Subjective Assessment - 01/10/16 1050    Subjective Pt reports she's returning to work on Monday and reports she's been feeling much stronger since the start of physical therapy. Patient states she's prepared for discharge.    Pertinent History 10/03/15 while at work started to feel numbness and tingling in her hand which transformed into right sided weakness and loss of sensation. Went to ER stayed overnight and went home. Nurse visitations stopped due to increase expense.    Limitations Standing;Walking   How long can you stand comfortably? 30 before fatigue   Patient Stated Goals Patient states she wants to feel better and return to work.    Currently in Pain? No/denies            [facility] PT Assessment - 01/10/16 1021      Transfers   Five time sit to stand comments  11     Ambulation/Gait   Gait velocity 1.64ms     Standardized Balance Assessment   Standardized Balance Assessment Berg Balance Test     Berg Balance Test   Sit to Stand Able to stand without using hands and stabilize independently   Standing Unsupported Able to stand safely 2 minutes   Sitting with Back Unsupported but Feet Supported on Floor or Stool Able to sit safely and securely 2 minutes   Stand to Sit Sits safely with minimal use of hands   Transfers Able to transfer safely, minor use of hands   Standing Unsupported with Eyes Closed Able to stand 10 seconds safely   Standing Ubsupported with Feet Together Able to place feet together independently and stand 1 minute safely   From Standing, Reach Forward with Outstretched Arm Can reach forward >12 cm safely (5")   From Standing Position, Pick up Object from Floor Able to pick up shoe safely and easily   From Standing Position, Turn to Look Behind Over each Shoulder Looks behind from both sides and weight shifts well   Turn 360 Degrees Able to turn 360 degrees safely in 4 seconds or less   Standing Unsupported, Alternately Place Feet on Step/Stool Able to stand independently and safely and complete 8 steps in 20 seconds   Standing Unsupported, One Foot in [place] foot  tandem independently and hold 30 seconds   Standing on One Leg Able to lift leg independently and hold > 10 seconds   Total Score 55      Treatment Nustep x 4 mins BUEs/BLEs level 2 (unbilled)  -Heel raises off of 1" step, 2 sets x 15 reps, for gastrocnemius strength/endurance -Standing Hip abduction in standing  - 2 x 20 -Standing Hip extension in standing - 2 x 20  Single leg stance with intermittent UE support - 3 x 30 sec Standing lunges at the treadmill with UE support - 2 x 15 Leg press with focus on knee positioning - 2 x 20      PT Education - 01/10/16 1049    Education provided Yes   Education Details HEP: single leg stance, standing hip abd/ext,  heel raises   Person(s) Educated Patient   Methods Explanation;Demonstration   Comprehension Verbalized understanding;Returned demonstration             PT Long Term Goals - 01/10/16 1040      PT LONG TERM GOAL #1   Title Pt will score <15 sec on the 5xSTS to demonstrate functional improvement in lower extremity strength and allow for improved about to rise out of a chair   Baseline 17.64sec 01/10/16: 11sec   Time 6   Period Weeks   Status Achieved     PT LONG TERM GOAL #2   Title Pt will score a >1.9ms on 1275malk test to demonstrate significant improvement in ambulation speed and decrease in fall risk   Baseline .9782m11/2/17: 1.75m/32m Time 6   Period Weeks   Status Achieved     PT LONG TERM GOAL #3   Title Patient will score a 48/56 on the BERG balance scale to demonstrate significant improvement in fall risk and improved ability to safely ambulate.   Baseline 38/56 01/10/16: 55/56   Time 6   Period Weeks   Status Achieved               Plan - 01/10/16 1043    Clinical Impression Statement Pt demonstrates significant improvement in static/dynamic balance and functional strength as demonstrated by improved 5xSTS, 10mW42mnd BERG scores. Patient has met all long term goals and is to be discharged from physical therapy.   Rehab Potential Good   Clinical Impairments Affecting Rehab Potential (+) family support (-) financial situation   PT Frequency 2x / week   PT Duration 6 weeks   PT Treatment/Interventions ADLs/Self Care Home Management;Cryotherapy;Electrical Stimulation;Ultrasound;Moist Heat;Gait training;Stair training;Therapeutic activities;Therapeutic exercise;Neuromuscular re-education;Patient/family education;Passive range of motion;Manual techniques   PT Next Visit Plan 6 min walk test, balancing measures   PT Home Exercise Plan sit to stands   Consulted and Agree with Plan of Care Patient      Patient will benefit from skilled therapeutic intervention  in order to improve the following deficits and impairments:  Abnormal gait, Decreased mobility, Decreased coordination, Increased muscle spasms, Impaired UE functional use, Decreased strength, Decreased range of motion, Decreased endurance, Impaired sensation, Difficulty walking, Decreased balance  Visit Diagnosis: Muscle weakness (generalized)  Unsteadiness on feet     Problem List Patient Active Problem List   Diagnosis Date Noted  . CVA (cerebral infarction) 10/03/2015    WesleBlythe StanfordDPT 01/10/2016, 10:51 AM  [facility] LPICES [id number]: [phone]:  [phone]: CarolCLEVIE PROUT [phone]  of Birth: 11-14-1965

## 2016-01-11 ENCOUNTER — Ambulatory Visit: Payer: Managed Care, Other (non HMO)

## 2016-01-15 ENCOUNTER — Ambulatory Visit: Payer: Managed Care, Other (non HMO) | Admitting: Physical Therapy

## 2016-01-15 ENCOUNTER — Ambulatory Visit: Payer: Managed Care, Other (non HMO)

## 2016-01-17 ENCOUNTER — Ambulatory Visit: Payer: Managed Care, Other (non HMO)

## 2016-03-18 ENCOUNTER — Emergency Department: Payer: Managed Care, Other (non HMO)

## 2016-03-18 ENCOUNTER — Other Ambulatory Visit: Admit: 2016-03-18 | Payer: PRIVATE HEALTH INSURANCE | Admitting: Family Medicine

## 2016-03-18 ENCOUNTER — Encounter: Payer: Self-pay | Admitting: *Deleted

## 2016-03-18 ENCOUNTER — Observation Stay
Admission: EM | Admit: 2016-03-18 | Discharge: 2016-03-20 | DRG: 812 | Disposition: A | Discharge status: Home / Self Care | Payer: Managed Care, Other (non HMO) | Attending: Internal Medicine | Admitting: Internal Medicine

## 2016-03-18 DIAGNOSIS — I10 Essential (primary) hypertension: Secondary | ICD-10-CM

## 2016-03-18 DIAGNOSIS — Y9301 Activity, walking, marching and hiking: Secondary | ICD-10-CM | POA: Diagnosis present

## 2016-03-18 DIAGNOSIS — Z8249 Family history of ischemic heart disease and other diseases of the circulatory system: Secondary | ICD-10-CM

## 2016-03-18 DIAGNOSIS — Z794 Long term (current) use of insulin: Secondary | ICD-10-CM

## 2016-03-18 DIAGNOSIS — F17210 Nicotine dependence, cigarettes, uncomplicated: Secondary | ICD-10-CM | POA: Diagnosis present

## 2016-03-18 DIAGNOSIS — Z79899 Other long term (current) drug therapy: Secondary | ICD-10-CM

## 2016-03-18 DIAGNOSIS — Y9263 Factory as the place of occurrence of the external cause: Secondary | ICD-10-CM

## 2016-03-18 DIAGNOSIS — I69351 Hemiplegia and hemiparesis following cerebral infarction affecting right dominant side: Secondary | ICD-10-CM | POA: Diagnosis not present

## 2016-03-18 DIAGNOSIS — E876 Hypokalemia: Secondary | ICD-10-CM | POA: Diagnosis present

## 2016-03-18 DIAGNOSIS — R011 Cardiac murmur, unspecified: Secondary | ICD-10-CM | POA: Diagnosis not present

## 2016-03-18 DIAGNOSIS — Z8673 Personal history of transient ischemic attack (TIA), and cerebral infarction without residual deficits: Secondary | ICD-10-CM | POA: Diagnosis not present

## 2016-03-18 DIAGNOSIS — Z7902 Long term (current) use of antithrombotics/antiplatelets: Secondary | ICD-10-CM

## 2016-03-18 DIAGNOSIS — R55 Syncope and collapse: Secondary | ICD-10-CM | POA: Diagnosis present

## 2016-03-18 DIAGNOSIS — E860 Dehydration: Secondary | ICD-10-CM | POA: Diagnosis present

## 2016-03-18 DIAGNOSIS — Z7982 Long term (current) use of aspirin: Secondary | ICD-10-CM

## 2016-03-18 DIAGNOSIS — W1830XA Fall on same level, unspecified, initial encounter: Secondary | ICD-10-CM | POA: Diagnosis present

## 2016-03-18 DIAGNOSIS — E119 Type 2 diabetes mellitus without complications: Secondary | ICD-10-CM | POA: Diagnosis present

## 2016-03-18 DIAGNOSIS — Y990 Civilian activity done for income or pay: Secondary | ICD-10-CM

## 2016-03-18 DIAGNOSIS — D649 Anemia, unspecified: Secondary | ICD-10-CM | POA: Diagnosis not present

## 2016-03-18 HISTORY — DX: Syncope and collapse: R55

## 2016-03-18 LAB — GLUCOSE, CAPILLARY
GLUCOSE-CAPILLARY: 174 mg/dL — AB (ref 65–99)
GLUCOSE-CAPILLARY: 216 mg/dL — AB (ref 65–99)
Glucose-Capillary: 379 mg/dL — ABNORMAL HIGH (ref 65–99)

## 2016-03-18 LAB — TROPONIN I

## 2016-03-18 LAB — BASIC METABOLIC PANEL
ANION GAP: 7 (ref 5–15)
BUN: 23 mg/dL — AB (ref 6–20)
CALCIUM: 9.3 mg/dL (ref 8.9–10.3)
CO2: 27 mmol/L (ref 22–32)
CREATININE: 1.09 mg/dL — AB (ref 0.44–1.00)
Chloride: 101 mmol/L (ref 101–111)
GFR calc Af Amer: >60 mL/min (ref >60)
GFR, EST NON AFRICAN AMERICAN: 58 mL/min — AB (ref >60)
GLUCOSE: 270 mg/dL — AB (ref 65–99)
Potassium: 3.3 mmol/L — ABNORMAL LOW (ref 3.5–5.1)
Sodium: 135 mmol/L (ref 135–145)

## 2016-03-18 LAB — CBC
HCT: 20.1 % — ABNORMAL LOW (ref 35.0–47.0)
HEMOGLOBIN: 6.9 g/dL — AB (ref 12.0–16.0)
MCH: 31.1 pg (ref 26.0–34.0)
MCHC: 34.1 g/dL (ref 32.0–36.0)
MCV: 91 fL (ref 80.0–100.0)
Platelets: 154 10*3/uL (ref 150–440)
RBC: 2.21 MIL/uL — ABNORMAL LOW (ref 3.80–5.20)
RDW: 13.2 % (ref 11.5–14.5)
WBC: 6.3 10*3/uL (ref 3.6–11.0)

## 2016-03-18 LAB — RETICULOCYTES
RBC.: 3.54 MIL/uL — AB (ref 3.80–5.20)
RETIC COUNT ABSOLUTE: 35.4 10*3/uL (ref 19.0–183.0)
RETIC CT PCT: 1 % (ref 0.4–3.1)

## 2016-03-18 LAB — IRON AND TIBC
Iron: 45 ug/dL (ref 28–170)
Saturation Ratios: 16 % (ref 10.4–31.8)
TIBC: 288 ug/dL (ref 250–450)
UIBC: 243 ug/dL

## 2016-03-18 LAB — HEMATOCRIT: HEMATOCRIT: 34.9 % — AB (ref 35.0–47.0)

## 2016-03-18 LAB — HEMOGLOBIN AND HEMATOCRIT, BLOOD
HCT: 30.5 % — ABNORMAL LOW (ref 35.0–47.0)
Hemoglobin: 10.8 g/dL — ABNORMAL LOW (ref 12.0–16.0)

## 2016-03-18 LAB — DAT, POLYSPECIFIC AHG (ARMC ONLY): POLYSPECIFIC AHG TEST: NEGATIVE

## 2016-03-18 LAB — HEMOGLOBIN: HEMOGLOBIN: 12.1 g/dL (ref 12.0–16.0)

## 2016-03-18 LAB — FOLATE: Folate: 22.4 ng/mL (ref >5.9)

## 2016-03-18 LAB — LACTATE DEHYDROGENASE: LDH: 127 U/L (ref 98–192)

## 2016-03-18 LAB — PREPARE RBC (CROSSMATCH)

## 2016-03-18 LAB — VITAMIN B12: VITAMIN B 12: 397 pg/mL (ref 180–914)

## 2016-03-18 LAB — ABO/RH: ABO/RH(D): O POS

## 2016-03-18 LAB — FERRITIN: FERRITIN: 105 ng/mL (ref 11–307)

## 2016-03-18 MED ORDER — GABAPENTIN 100 MG PO CAPS
100.0000 mg | ORAL_CAPSULE | Freq: Two times a day (BID) | ORAL | Status: DC
  Administered 2016-03-18 – 2016-03-20 (×5): 100 mg via ORAL
  Filled 2016-03-18 (×5): qty 1

## 2016-03-18 MED ORDER — INSULIN ASPART 100 UNIT/ML ~~LOC~~ SOLN
0.0000 [IU] | Freq: Three times a day (TID) | SUBCUTANEOUS | Status: DC
  Administered 2016-03-18: 3 [IU] via SUBCUTANEOUS
  Administered 2016-03-18: 15 [IU] via SUBCUTANEOUS
  Administered 2016-03-19 – 2016-03-20 (×3): 5 [IU] via SUBCUTANEOUS
  Administered 2016-03-20: 3 [IU] via SUBCUTANEOUS
  Filled 2016-03-18 (×2): qty 3
  Filled 2016-03-18: qty 15
  Filled 2016-03-18 (×2): qty 5

## 2016-03-18 MED ORDER — FERROUS SULFATE 325 (65 FE) MG PO TABS
325.0000 mg | ORAL_TABLET | Freq: Two times a day (BID) | ORAL | Status: DC
  Administered 2016-03-18 – 2016-03-20 (×5): 325 mg via ORAL
  Filled 2016-03-18 (×5): qty 1

## 2016-03-18 MED ORDER — POTASSIUM CHLORIDE CRYS ER 20 MEQ PO TBCR
40.0000 meq | EXTENDED_RELEASE_TABLET | Freq: Once | ORAL | Status: AC
  Administered 2016-03-18: 40 meq via ORAL
  Filled 2016-03-18: qty 2

## 2016-03-18 MED ORDER — ONDANSETRON HCL 4 MG/2ML IJ SOLN: 4.0000 mg | Freq: Four times a day (QID) | INTRAMUSCULAR | Status: DC | PRN

## 2016-03-18 MED ORDER — ONDANSETRON HCL 4 MG PO TABS: 4.0000 mg | ORAL_TABLET | Freq: Four times a day (QID) | ORAL | Status: DC | PRN

## 2016-03-18 MED ORDER — MECLIZINE HCL 25 MG PO TABS: 25.0000 mg | ORAL_TABLET | Freq: Three times a day (TID) | ORAL | Status: DC | PRN

## 2016-03-18 MED ORDER — INSULIN ASPART 100 UNIT/ML ~~LOC~~ SOLN
0.0000 [IU] | Freq: Every day | SUBCUTANEOUS | Status: DC
  Administered 2016-03-18: 2 [IU] via SUBCUTANEOUS
  Filled 2016-03-18: qty 2

## 2016-03-18 MED ORDER — ATORVASTATIN CALCIUM 20 MG PO TABS
40.0000 mg | ORAL_TABLET | Freq: Every day | ORAL | Status: DC
  Administered 2016-03-18 – 2016-03-19 (×2): 40 mg via ORAL
  Filled 2016-03-18 (×2): qty 2

## 2016-03-18 MED ORDER — SODIUM CHLORIDE 0.9 % IV SOLN
Freq: Once | INTRAVENOUS | Status: AC
  Administered 2016-03-18: 17:00:00 via INTRAVENOUS

## 2016-03-18 MED ORDER — LISINOPRIL 20 MG PO TABS
40.0000 mg | ORAL_TABLET | Freq: Every morning | ORAL | Status: DC
  Administered 2016-03-18 – 2016-03-20 (×3): 40 mg via ORAL
  Filled 2016-03-18 (×3): qty 2

## 2016-03-18 MED ORDER — ACETAMINOPHEN 325 MG PO TABS: 650.0000 mg | ORAL_TABLET | Freq: Four times a day (QID) | ORAL | Status: DC | PRN

## 2016-03-18 MED ORDER — ACETAMINOPHEN 650 MG RE SUPP: 650.0000 mg | Freq: Four times a day (QID) | RECTAL | Status: DC | PRN

## 2016-03-18 MED ORDER — OXYCODONE HCL 5 MG PO TABS: 5.0000 mg | ORAL_TABLET | Freq: Four times a day (QID) | ORAL | Status: DC | PRN

## 2016-03-18 MED ORDER — GLIPIZIDE 5 MG PO TABS
5.0000 mg | ORAL_TABLET | Freq: Every day | ORAL | Status: DC
  Administered 2016-03-18 – 2016-03-20 (×3): 5 mg via ORAL
  Filled 2016-03-18 (×3): qty 1

## 2016-03-18 MED ORDER — SENNOSIDES-DOCUSATE SODIUM 8.6-50 MG PO TABS: 1.0000 | ORAL_TABLET | Freq: Every evening | ORAL | Status: DC | PRN

## 2016-03-18 MED ORDER — SODIUM CHLORIDE 0.9 % IV SOLN
INTRAVENOUS | Status: DC
  Administered 2016-03-18 – 2016-03-20 (×5): via INTRAVENOUS

## 2016-03-18 NOTE — Consult Note (Signed)
Jonathon Bellows MD  [address] [place], [place] [address] Phone: [phone] Fax : [phone]  Consultation  Referring Provider:     No ref. provider found Primary Care Physician:  No PCP Per Patient Primary Gastroenterologist:  Dr. Vicente Males         Reason for Consultation:     Anemia   Date of Admission:  03/18/2016 Date of Consultation:  03/18/2016         HPI:   Lori Larsen is a 51 y.o. female was brought to the ER after she passed out at work . She is on Plavix , asprin 325 mg . On admission found to have a Hb of 6.9 , MCV 91. No blood in urine. Her Hb was 9.1 in 06/2014, 10.9 in 09/2015 .   I do not see any iron studies on EPIC, no mention of iron deficiency or anemias on EPIC previously. I do note that on 09/2015 she was discharged with a CVA, was anemic with a Hb 10.9 . No plan at discharge regarding anemia.   She denies any vaginal, rectal bleeding . No blood in urine or nasal bleeds. Denies any NSAID use. No prior colonoscopy or EGD. No abdominal pain either.     Past Medical History:  Diagnosis Date  . Diabetes mellitus without complication ([place])   . Hypertension     Past Surgical History:  Procedure Laterality Date  . CESAREAN SECTION      Prior to Admission medications   Medication Sig Start Date End Date Taking? Authorizing Provider  aspirin 325 MG tablet Take 1 tablet (325 mg total) by mouth daily. 10/04/15  Yes Bettey Costa, MD  atorvastatin (LIPITOR) 40 MG tablet Take 1 tablet (40 mg total) by mouth daily at 6 PM. 10/04/15  Yes Sital Mody, MD  clopidogrel (PLAVIX) 75 MG tablet Take 75 mg by mouth daily.   Yes Historical Provider, MD  ferrous sulfate 325 (65 FE) MG tablet Take 1 tablet by mouth 2 (two) times daily. 02/04/16  Yes Historical Provider, MD  gabapentin (NEURONTIN) 100 MG capsule Take 1 capsule by mouth 2 (two) times daily.  03/18/16  Yes Historical Provider, MD  glipiZIDE (GLUCOTROL) 5 MG tablet Take 1 tablet (5 mg total) by mouth daily. 09/27/15 09/26/16 Yes  Earleen Newport, MD  hydrochlorothiazide (HYDRODIURIL) 25 MG tablet Take 1 tablet by mouth daily. 12/25/15  Yes Historical Provider, MD  lisinopril (PRINIVIL,ZESTRIL) 40 MG tablet Take 1 tablet by mouth every morning. 02/04/16  Yes Historical Provider, MD  meclizine (ANTIVERT) 25 MG tablet Take 1 tablet (25 mg total) by mouth 3 (three) times daily as needed for dizziness or nausea. 10/26/14  Yes Earleen Newport, MD  TRESIBA FLEXTOUCH 100 UNIT/ML SOPN FlexTouch Pen Inject 17 Units into the skin every morning. 01/25/16  Yes Historical Provider, MD  nicotine (NICODERM CQ - DOSED IN MG/24 HOURS) 21 mg/24hr patch Place 1 patch (21 mg total) onto the skin daily. Patient not taking: Reported on 03/18/2016 10/04/15   Bettey Costa, MD  oxyCODONE (ROXICODONE) 5 MG immediate release tablet Take 1 tablet (5 mg total) by mouth every 6 (six) hours as needed for moderate pain. Do not drive while taking this medication. Patient not taking: Reported on 03/18/2016 08/04/14   Joanne Gavel, MD    Family History  Problem Relation Age of Onset  . Hypertension Mother   . Hypertension Father   . Breast cancer Neg Hx      Social History  Substance Use Topics  . Smoking status: Current Some Day Smoker    Packs/day: 0.50    Years: 20.00    Types: Cigarettes  . Smokeless tobacco: Never Used  . Alcohol use Yes     Comment:  occasionally drinks beer    Allergies as of 03/18/2016  . (No Known Allergies)    Review of Systems:    All systems reviewed and negative except where noted in HPI.   Physical Exam:  Vital signs in last 24 hours: Temp:  [98.1 F (36.7 C)-98.2 F (36.8 C)] 98.2 F (36.8 C) (01/09 0858) Pulse Rate:  [65-81] 70 (01/09 0858) Resp:  [17-20] 18 (01/09 0858) BP: (131-157)/(79-97) 131/79 (01/09 0858) SpO2:  [99 %-100 %] 100 % (01/09 0858) Weight:  [140 lb (63.5 kg)] 140 lb (63.5 kg) (01/09 0218) Last BM Date: 03/18/16 General:   Pleasant, cooperative in NAD Head:  Normocephalic and  atraumatic. Eyes:   No icterus.   Conjunctiva pink. PERRLA. Ears:  Normal auditory acuity. Neck:  Supple; no masses or thyroidomegaly Lungs: Respirations even and unlabored. Lungs clear to auscultation bilaterally.   No wheezes, crackles, or rhonchi.  Heart:  Regular rate and rhythm;  Without murmur, clicks, rubs or gallops Abdomen:  Soft, nondistended, nontender. Normal bowel sounds. No appreciable masses or hepatomegaly.  No rebound or guarding.  Neurologic:  Alert and oriented x3;  grossly normal neurologically. Skin:  Intact without significant lesions or rashes. Cervical Nodes:  No significant cervical adenopathy. Psych:  Alert and cooperative. Normal affect.  LAB RESULTS:  Recent Labs  03/18/16 0219  WBC 6.3  HGB 6.9*  HCT 20.1*  PLT 154   BMET  Recent Labs  03/18/16 0219  NA 135  K 3.3*  CL 101  CO2 27  GLUCOSE 270*  BUN 23*  CREATININE 1.09*  CALCIUM 9.3   LFT No results for input(s): PROT, ALBUMIN, AST, ALT, ALKPHOS, BILITOT, BILIDIR, IBILI in the last 72 hours. PT/INR No results for input(s): LABPROT, INR in the last 72 hours.  STUDIES: Ct Head Wo Contrast  Result Date: 03/18/2016 CLINICAL DATA:  Syncopal episode at work. Diabetic. Neck pain after the fall. History of stroke in July 2017. History of hypertension. EXAM: CT HEAD WITHOUT CONTRAST CT CERVICAL SPINE WITHOUT CONTRAST TECHNIQUE: Multidetector CT imaging of the head and cervical spine was performed following the standard protocol without intravenous contrast. Multiplanar CT image reconstructions of the cervical spine were also generated. COMPARISON:  MRI brain 10/03/2015.  CT head 10/03/2015 FINDINGS: CT HEAD FINDINGS Brain: No evidence of acute infarction, hemorrhage, hydrocephalus, extra-axial collection or mass lesion/mass effect. 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 Vascular: No hyperdense vessel or unexpected calcification. Skull: Normal. Negative for fracture or focal lesion. Sinuses/Orbits: No acute  finding. Other: None. CT CERVICAL SPINE FINDINGS Alignment: There is reversal of the usual cervical lordosis without anterior subluxation. Normal alignment of the facet joints. Is likely due to patient positioning or degenerative change but ligamentous injury or muscle spasm could also have this appearance and are not excluded. C1-2 articulation appears intact. Skull base and vertebrae: No acute fracture. No primary bone lesion or focal pathologic process. Soft tissues and spinal canal: No prevertebral fluid or swelling. No visible canal hematoma. Disc levels: Degenerative changes with narrowed interspaces and endplate hypertrophic changes most prominent at C4-5, C5-6, and C6-7 levels. Upper chest: Negative. Other: None. IMPRESSION: No acute intracranial abnormalities. Nonspecific reversal of the usual cervical lordosis. Degenerative changes. No acute displaced cervical spine fractures. Electronically Signed  By: Lucienne Capers M.D.   On: 03/18/2016 03:39   Ct Cervical Spine Wo Contrast  Result Date: 03/18/2016 CLINICAL DATA:  Syncopal episode at work. Diabetic. Neck pain after the fall. History of stroke in July 2017. History of hypertension. EXAM: CT HEAD WITHOUT CONTRAST CT CERVICAL SPINE WITHOUT CONTRAST TECHNIQUE: Multidetector CT imaging of the head and cervical spine was performed following the standard protocol without intravenous contrast. Multiplanar CT image reconstructions of the cervical spine were also generated. COMPARISON:  MRI brain 10/03/2015.  CT head 10/03/2015 FINDINGS: CT HEAD FINDINGS Brain: No evidence of acute infarction, hemorrhage, hydrocephalus, extra-axial collection or mass lesion/mass effect. 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 Vascular: No hyperdense vessel or unexpected calcification. Skull: Normal. Negative for fracture or focal lesion. Sinuses/Orbits: No acute finding. Other: None. CT CERVICAL SPINE FINDINGS Alignment: There is reversal of the usual cervical lordosis without  anterior subluxation. Normal alignment of the facet joints. Is likely due to patient positioning or degenerative change but ligamentous injury or muscle spasm could also have this appearance and are not excluded. C1-2 articulation appears intact. Skull base and vertebrae: No acute fracture. No primary bone lesion or focal pathologic process. Soft tissues and spinal canal: No prevertebral fluid or swelling. No visible canal hematoma. Disc levels: Degenerative changes with narrowed interspaces and endplate hypertrophic changes most prominent at C4-5, C5-6, and C6-7 levels. Upper chest: Negative. Other: None. IMPRESSION: No acute intracranial abnormalities. Nonspecific reversal of the usual cervical lordosis. Degenerative changes. No acute displaced cervical spine fractures. Electronically Signed   By: Lucienne Capers M.D.   On: 03/18/2016 03:39      Impression / Plan:   Lori Larsen is a 51 y.o. y/o female . I have been consulted for normocytic anemia. No overt blood loss. She is on plavix , asprin and has been on iron since September 2017. No prior iron studies to determine if she has iron deficiency . I have ordered iron studies which may be falsely normal due to pre treatment with iron. She has never had GI evaluation and she would need EGD+ colonoscopy . Since she has no overt bleeding she can have it done as an outpatient if her Hb is stable after transfusion . She will need to be off plavix for 5 days after clearance from her neurologist to do so. If neurologist feels that it is not possible to hold plavix then we will still proceed with the procedure but will not be able to excise any large polyps if seen .      Thank you for involving me in the care of this patient.      LOS: 0 days   Jonathon Bellows, MD  03/18/2016, 11:38 AM

## 2016-03-18 NOTE — ED Notes (Signed)
Tech at bedside to attempt to obtain urine for worker comp. Pt unable to urinate. Waiting on admit bed. Meal tray ordered.

## 2016-03-18 NOTE — ED Notes (Signed)
Female to desk stating that he is pt's supervisor and is concerned because she has a neck brace on and is requesting a drug screen; informed that pt is not filing any workers comp but st he wants a random drug screen; female left prior to leaving his name and contact info and did not inform pt of request so screening not completed

## 2016-03-18 NOTE — ED Notes (Signed)
Pt unable to void at this time. 

## 2016-03-18 NOTE — ED Triage Notes (Addendum)
Pt to triage via wheelchair.  Pt brought in by ems from work.  Pt had syncopal episode.  Pt is diabetic.  Pt has neck pain after the fall.  No headache.   c-collar placed in triage.  Pt alert.  Speech clear.

## 2016-03-18 NOTE — Progress Notes (Signed)
51 year old female patient admitted this morning for syncope, found to have anemia with hemoglobin 0.9. Patient denies any melena. Does not  Have any h/o Malena.. Has history of diabetes mellitus type 2.  Vitals reviewed. Laboratory reviewed. Physical exam shows ;cardiovascular system ;S1 and S2 regular Lungs; clear to auscultation Abdomen; soft nontender non,distended, bowel sounds present Assessment and plan #1 syncope secondary to symptomatic anemia: may be slow GI losses; Hemoglobin 6.9 and hematocrit 20.1: Transfuse 1 unit of packed RBC, continue PPI,, GI consult. Anemia panel shows normal iron levels.hold plavix.  #2 diabetes mellitus type 2;; continue Glucotrol. 10 hyperlipidemia: Continue statins. 3.hypokalemia;replace k Time;35 min

## 2016-03-18 NOTE — Consult Note (Signed)
Hematology/Oncology Consult note [facility] Telephone:([phone] [phone]  Patient Care Team: No Pcp Per Patient as PCP - General (General Practice)   Name of the patient: Lori Larsen  [id number]  1965/08/19    Reason for referral- normocytic anemia   Referring physician- Dr. Estanislado Pandy  Date of visit: 03/18/2016   History of presenting illness- patient is a 51 year old female with a past medical history significant for hypertension and diabetes presented to the ER this morning after she passed out at work. Patient reports symptoms of fatigue and feeling tired over the last couple of weeks. Patient denied any history of bright red blood in stool or dark tarry stools denies any gum bleeds, nosebleeds and she has not thrown up or coughed up blood. No h/o vaginal bleeding Patient had a stool guaiac in the ER which was negative. CBC done today in the ER showed white count of 6.3, H&H of 6.9/20.1 and a platelet count of 154. On looking at her prior CBCs from 2016 she was known to have some anemia with a hemoglobin between 8.6-10.6. Her prior CBC from 10/03/2015 revealed H&H of 10.9/32.9. Ferritin done today was 105 with normal iron studies. Folate was normal at 22.4. Reticulocyte count was 1% (inappropriately low for the degree of anemia). CMP was normal except for a blood sugar of 206 with a normal total protein and normal total bilirubin. CT head and CT cervical spine did not reveal any acute pathology. She has never had any EGD or colonoscopy. Denies any family history of colon cancer  ECOG PS-1   Review of systems- Review of Systems  Constitutional: Positive for malaise/fatigue. Negative for chills, fever and weight loss.  HENT: Negative for congestion, ear discharge and nosebleeds.   Eyes: Negative for blurred vision.  Respiratory: Negative for cough, hemoptysis, sputum production, shortness of breath and wheezing.   Cardiovascular: Negative for chest  pain, palpitations, orthopnea and claudication.  Gastrointestinal: Negative for abdominal pain, blood in stool, constipation, diarrhea, heartburn, melena, nausea and vomiting.  Genitourinary: Negative for dysuria, flank pain, frequency, hematuria and urgency.  Musculoskeletal: Negative for back pain, joint pain and myalgias.  Skin: Negative for rash.  Neurological: Negative for dizziness, tingling, focal weakness, seizures, weakness and headaches.  Endo/Heme/Allergies: Does not bruise/bleed easily.  Psychiatric/Behavioral: Negative for depression and suicidal ideas. The patient does not have insomnia.     No Known Allergies  Patient Active Problem List   Diagnosis Date Noted  . Syncope 03/18/2016  . Anemia 03/18/2016  . CVA (cerebral infarction) 10/03/2015     Past Medical History:  Diagnosis Date  . Diabetes mellitus without complication ([place])   . Hypertension      Past Surgical History:  Procedure Laterality Date  . CESAREAN SECTION      Social History   Social History  . Marital status: Single    Spouse name: N/A  . Number of children: N/A  . Years of education: N/A   Occupational History  . works at [employer]  . Smoking status: Current Some Day Smoker    Packs/day: 0.50    Years: 20.00    Types: Cigarettes  . Smokeless tobacco: Never Used  . Alcohol use Yes     Comment:  occasionally drinks beer  . Drug use: No  . Sexual activity: Yes    Birth control/ protection: Post-menopausal   Other Topics Concern  . Not on file   Social History  Narrative  . No narrative on file     Family History  Problem Relation Age of Onset  . Hypertension Mother   . Hypertension Father   . Breast cancer Neg Hx      Current Facility-Administered Medications:  .  0.9 %  sodium chloride infusion, , Intravenous, Continuous, Pavan Pyreddy, MD, Last Rate: 75 mL/hr at 03/18/16 1051 .  0.9 %  sodium chloride infusion, , Intravenous,  Once, Epifanio Lesches, MD .  acetaminophen (TYLENOL) tablet 650 mg, 650 mg, Oral, Q6H PRN **OR** acetaminophen (TYLENOL) suppository 650 mg, 650 mg, Rectal, Q6H PRN, Reatha Harps Pyreddy, MD .  atorvastatin (LIPITOR) tablet 40 mg, 40 mg, Oral, q1800, Pavan Pyreddy, MD .  ferrous sulfate tablet 325 mg, 325 mg, Oral, BID, Pavan Pyreddy, MD, 325 mg at 03/18/16 1140 .  gabapentin (NEURONTIN) capsule 100 mg, 100 mg, Oral, BID, Pavan Pyreddy, MD, 100 mg at 03/18/16 1142 .  glipiZIDE (GLUCOTROL) tablet 5 mg, 5 mg, Oral, Q breakfast, Pavan Pyreddy, MD, 5 mg at 03/18/16 1152 .  insulin aspart (novoLOG) injection 0-15 Units, 0-15 Units, Subcutaneous, TID WC, Saundra Shelling, MD, 15 Units at 03/18/16 1208 .  insulin aspart (novoLOG) injection 0-5 Units, 0-5 Units, Subcutaneous, QHS, Pavan Pyreddy, MD .  lisinopril (PRINIVIL,ZESTRIL) tablet 40 mg, 40 mg, Oral, q morning - 10a, Pavan Pyreddy, MD, 40 mg at 03/18/16 1141 .  meclizine (ANTIVERT) tablet 25 mg, 25 mg, Oral, TID PRN, Reatha Harps Pyreddy, MD .  ondansetron (ZOFRAN) tablet 4 mg, 4 mg, Oral, Q6H PRN **OR** ondansetron (ZOFRAN) injection 4 mg, 4 mg, Intravenous, Q6H PRN, Pavan Pyreddy, MD .  oxyCODONE (Oxy IR/ROXICODONE) immediate release tablet 5 mg, 5 mg, Oral, Q6H PRN, Pavan Pyreddy, MD .  senna-docusate (Senokot-S) tablet 1 tablet, 1 tablet, Oral, QHS PRN, Saundra Shelling, MD   Physical exam:  Vitals:   03/18/16 0800 03/18/16 0837 03/18/16 0858 03/18/16 1448  BP:  (!) 143/84 131/79 117/69  Pulse:  66 70 82  Resp: '17 18 18 14  ' Temp:   98.2 F (36.8 C) 98.5 F (36.9 C)  TempSrc:   Oral Oral  SpO2:   100% 98%  Weight:      Height:       Physical Exam  Constitutional: She is oriented to person, place, and time and well-developed, well-nourished, and in no distress.  HENT:  Head: Normocephalic and atraumatic.  Eyes: EOM are normal. Pupils are equal, round, and reactive to light.  Neck: Normal range of motion.  Cardiovascular: Normal rate and regular  rhythm.   Soft systolic murmur +  Pulmonary/Chest: Effort normal and breath sounds normal.  Abdominal: Soft. Bowel sounds are normal.  Neurological: She is alert and oriented to person, place, and time.  Skin: Skin is warm and dry.       CMP Latest Ref Rng & Units 03/18/2016  Glucose 65 - 99 mg/dL 270(H)  BUN 6 - 20 mg/dL 23(H)  Creatinine 0.44 - 1.00 mg/dL 1.09(H)  Sodium 135 - 145 mmol/L 135  Potassium 3.5 - 5.1 mmol/L 3.3(L)  Chloride 101 - 111 mmol/L 101  CO2 22 - 32 mmol/L 27  Calcium 8.9 - 10.3 mg/dL 9.3  Total Protein 6.5 - 8.1 g/dL -  Total Bilirubin 0.3 - 1.2 mg/dL -  Alkaline Phos 38 - 126 U/L -  AST 15 - 41 U/L -  ALT 14 - 54 U/L -   CBC Latest Ref Rng & Units 03/18/2016  WBC 3.6 - 11.0 K/uL 6.3  Hemoglobin 12.0 - 16.0 g/dL 6.9(L)  Hematocrit 35.0 - 47.0 % 20.1(L)  Platelets 150 - 440 K/uL 154    '@IMAGES' @  Ct Head Wo Contrast  Result Date: 03/18/2016 CLINICAL DATA:  Syncopal episode at work. Diabetic. Neck pain after the fall. History of stroke in July 2017. History of hypertension. EXAM: CT HEAD WITHOUT CONTRAST CT CERVICAL SPINE WITHOUT CONTRAST TECHNIQUE: Multidetector CT imaging of the head and cervical spine was performed following the standard protocol without intravenous contrast. Multiplanar CT image reconstructions of the cervical spine were also generated. COMPARISON:  MRI brain 10/03/2015.  CT head 10/03/2015 FINDINGS: CT HEAD FINDINGS Brain: No evidence of acute infarction, hemorrhage, hydrocephalus, extra-axial collection or mass lesion/mass effect. 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 Vascular: No hyperdense vessel or unexpected calcification. Skull: Normal. Negative for fracture or focal lesion. Sinuses/Orbits: No acute finding. Other: None. CT CERVICAL SPINE FINDINGS Alignment: There is reversal of the usual cervical lordosis without anterior subluxation. Normal alignment of the facet joints. Is likely due to patient positioning or degenerative change but  ligamentous injury or muscle spasm could also have this appearance and are not excluded. C1-2 articulation appears intact. Skull base and vertebrae: No acute fracture. No primary bone lesion or focal pathologic process. Soft tissues and spinal canal: No prevertebral fluid or swelling. No visible canal hematoma. Disc levels: Degenerative changes with narrowed interspaces and endplate hypertrophic changes most prominent at C4-5, C5-6, and C6-7 levels. Upper chest: Negative. Other: None. IMPRESSION: No acute intracranial abnormalities. Nonspecific reversal of the usual cervical lordosis. Degenerative changes. No acute displaced cervical spine fractures. Electronically Signed   By: Lucienne Capers M.D.   On: 03/18/2016 03:39   Ct Cervical Spine Wo Contrast  Result Date: 03/18/2016 CLINICAL DATA:  Syncopal episode at work. Diabetic. Neck pain after the fall. History of stroke in July 2017. History of hypertension. EXAM: CT HEAD WITHOUT CONTRAST CT CERVICAL SPINE WITHOUT CONTRAST TECHNIQUE: Multidetector CT imaging of the head and cervical spine was performed following the standard protocol without intravenous contrast. Multiplanar CT image reconstructions of the cervical spine were also generated. COMPARISON:  MRI brain 10/03/2015.  CT head 10/03/2015 FINDINGS: CT HEAD FINDINGS Brain: No evidence of acute infarction, hemorrhage, hydrocephalus, extra-axial collection or mass lesion/mass effect. 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 Vascular: No hyperdense vessel or unexpected calcification. Skull: Normal. Negative for fracture or focal lesion. Sinuses/Orbits: No acute finding. Other: None. CT CERVICAL SPINE FINDINGS Alignment: There is reversal of the usual cervical lordosis without anterior subluxation. Normal alignment of the facet joints. Is likely due to patient positioning or degenerative change but ligamentous injury or muscle spasm could also have this appearance and are not excluded. C1-2 articulation appears  intact. Skull base and vertebrae: No acute fracture. No primary bone lesion or focal pathologic process. Soft tissues and spinal canal: No prevertebral fluid or swelling. No visible canal hematoma. Disc levels: Degenerative changes with narrowed interspaces and endplate hypertrophic changes most prominent at C4-5, C5-6, and C6-7 levels. Upper chest: Negative. Other: None. IMPRESSION: No acute intracranial abnormalities. Nonspecific reversal of the usual cervical lordosis. Degenerative changes. No acute displaced cervical spine fractures. Electronically Signed   By: Lucienne Capers M.D.   On: 03/18/2016 03:39    Assessment and plan- Patient is a 51 y.o. female who was admitted after passing out and was found to have acute normocytic anemia  1. Patient has normocytic anemia with normal iron studies.  However she has been on oral iron since September 2017. Hence I would recommend a  complete GI workup for her at this time to rule out any blood loss given that she has never had an EGD or colonoscopy. Also her reticulocyte count is low for the degree of anemia and she has a normal bilirubin which argues against hemolysis. However I recommend checking haptoglobin, LDH and coombs test. Also check myeloma panel including SPEP, quantitative immunoglobulins and Serum IFE. 24 hour urine protein, UPEP and Urine IFE. Kappa lambda free light chains.   I will follow her up as an outpatient 2 weeks upon discharge. If a GI workup was unremarkable and cause of her anemia remains uncertain, I will consider a bone marrow biopsy at that time. We will also have pathology comment on her peripheral smear.  Thank you for the consult. We will follow along during the hospital stay  Visit Diagnosis 1. Syncope, unspecified syncope type   2. Anemia, unspecified type     Dr. Randa Evens, MD, MPH [facility] Pager- [phone] 03/18/2016 3:40 PM

## 2016-03-18 NOTE — H&P (Signed)
[facility] [place] NAME: Lori Larsen    MR#:  [id number]  DATE OF BIRTH:  08/17/1965  DATE OF ADMISSION:  03/18/2016  PRIMARY CARE PHYSICIAN: No PCP Per Patient   REQUESTING/REFERRING PHYSICIAN:   CHIEF COMPLAINT:   Chief Complaint  Patient presents with  . Loss of Consciousness    HISTORY OF PRESENT ILLNESS: Lori Larsen  is a 51 y.o. female with a known history of Diabetes mellitus type 2, hypertension presented to the emergency room after she passed out at work. Patient works in a [employer] in the third shift she felt weak and tired and passed out. Off late for the last couple of weeks she has been feeling tired and fatigued. Patient was evaluated in the emergency room her hemoglobin was low but stool guaiac was negative. No history of any vomiting of blood or coughing up blood. No complaints of any rectal bleed. No complaints of any chest pain. Patient on iron sulfate tablets as outpatient .Hospitalist service was consulted for further care of the patient.  PAST MEDICAL HISTORY:   Past Medical History:  Diagnosis Date  . Diabetes mellitus without complication ([place])   . Hypertension     PAST SURGICAL HISTORY: Past Surgical History:  Procedure Laterality Date  . CESAREAN SECTION      SOCIAL HISTORY:  Social History  Substance Use Topics  . Smoking status: Current Some Day Smoker    Packs/day: 0.50    Years: 20.00    Types: Cigarettes  . Smokeless tobacco: Never Used  . Alcohol use Yes     Comment:  occasionally drinks beer    FAMILY HISTORY:  Family History  Problem Relation Age of Onset  . Hypertension Mother   . Hypertension Father   . Breast cancer Neg Hx     DRUG ALLERGIES: No Known Allergies  REVIEW OF SYSTEMS:   CONSTITUTIONAL: No fever, has weakness.  EYES: No blurred or double vision.  EARS, NOSE, AND THROAT: No tinnitus or ear pain.  RESPIRATORY: No cough, shortness of breath,  wheezing or hemoptysis.  CARDIOVASCULAR: No chest pain, orthopnea, edema.  GASTROINTESTINAL: No nausea, vomiting, diarrhea or abdominal pain.  GENITOURINARY: No dysuria, hematuria.  ENDOCRINE: No polyuria, nocturia,  HEMATOLOGY: Has anemia,no easy bruising or bleeding SKIN: No rash or lesion. MUSCULOSKELETAL: No joint pain or arthritis.   NEUROLOGIC: No tingling, numbness, weakness.  PSYCHIATRY: No anxiety or depression.   MEDICATIONS AT HOME:  Prior to Admission medications   Medication Sig Start Date End Date Taking? Authorizing Provider  aspirin 325 MG tablet Take 1 tablet (325 mg total) by mouth daily. 10/04/15  Yes Bettey Costa, MD  atorvastatin (LIPITOR) 40 MG tablet Take 1 tablet (40 mg total) by mouth daily at 6 PM. 10/04/15  Yes Sital Mody, MD  clopidogrel (PLAVIX) 75 MG tablet Take 75 mg by mouth daily.   Yes Historical Provider, MD  ferrous sulfate 325 (65 FE) MG tablet Take 1 tablet by mouth 2 (two) times daily. 02/04/16  Yes Historical Provider, MD  gabapentin (NEURONTIN) 100 MG capsule Take 1 capsule by mouth 2 (two) times daily.  03/18/16  Yes Historical Provider, MD  glipiZIDE (GLUCOTROL) 5 MG tablet Take 1 tablet (5 mg total) by mouth daily. 09/27/15 09/26/16 Yes Earleen Newport, MD  hydrochlorothiazide (HYDRODIURIL) 25 MG tablet Take 1 tablet by mouth daily. 12/25/15  Yes Historical Provider, MD  lisinopril (PRINIVIL,ZESTRIL) 40 MG tablet Take 1 tablet by mouth  every morning. 02/04/16  Yes Historical Provider, MD  meclizine (ANTIVERT) 25 MG tablet Take 1 tablet (25 mg total) by mouth 3 (three) times daily as needed for dizziness or nausea. 10/26/14  Yes Earleen Newport, MD  TRESIBA FLEXTOUCH 100 UNIT/ML SOPN FlexTouch Pen Inject 17 Units into the skin every morning. 01/25/16  Yes Historical Provider, MD  nicotine (NICODERM CQ - DOSED IN MG/24 HOURS) 21 mg/24hr patch Place 1 patch (21 mg total) onto the skin daily. Patient not taking: Reported on 03/18/2016 10/04/15   Bettey Costa, MD  oxyCODONE (ROXICODONE) 5 MG immediate release tablet Take 1 tablet (5 mg total) by mouth every 6 (six) hours as needed for moderate pain. Do not drive while taking this medication. Patient not taking: Reported on 03/18/2016 08/04/14   Joanne Gavel, MD      PHYSICAL EXAMINATION:   VITAL SIGNS: Blood pressure (!) 155/87, pulse 81, temperature 98.1 F (36.7 C), temperature source Oral, resp. rate 20, height 5' (1.524 m), weight 63.5 kg (140 lb), SpO2 99 %.  GENERAL:  51 y.o.-year-old patient lying in the bed with no acute distress.  EYES: Pupils equal, round, reactive to light and accommodation. No scleral icterus, pallor present. Extraocular muscles intact.  HEENT: Head atraumatic, normocephalic. Oropharynx and nasopharynx clear.  NECK:  Supple, no jugular venous distention. No thyroid enlargement, no tenderness.  LUNGS: Normal breath sounds bilaterally, no wheezing, rales,rhonchi or crepitation. No use of accessory muscles of respiration.  CARDIOVASCULAR: S1, S2 normal. No murmurs, rubs, or gallops.  ABDOMEN: Soft, nontender, nondistended. Bowel sounds present. No organomegaly or mass.  EXTREMITIES: No pedal edema, cyanosis, or clubbing.  NEUROLOGIC: Cranial nerves II through XII are intact. Muscle strength 5/5 in all extremities. Sensation intact. Gait not checked.  PSYCHIATRIC: The patient is alert and oriented x 3.  SKIN: No obvious rash, lesion, or ulcer.   LABORATORY PANEL:   CBC  Recent Labs Lab 03/18/16 0219  WBC 6.3  HGB 6.9*  HCT 20.1*  PLT 154  MCV 91.0  MCH 31.1  MCHC 34.1  RDW 13.2   ------------------------------------------------------------------------------------------------------------------  Chemistries   Recent Labs Lab 03/18/16 0219  NA 135  K 3.3*  CL 101  CO2 27  GLUCOSE 270*  BUN 23*  CREATININE 1.09*  CALCIUM 9.3    ------------------------------------------------------------------------------------------------------------------ estimated creatinine clearance is 50.8 mL/min (by C-G formula based on SCr of 1.09 mg/dL (H)). ------------------------------------------------------------------------------------------------------------------ No results for input(s): TSH, T4TOTAL, T3FREE, THYROIDAB in the last 72 hours.  Invalid input(s): FREET3   Coagulation profile No results for input(s): INR, PROTIME in the last 168 hours. ------------------------------------------------------------------------------------------------------------------- No results for input(s): DDIMER in the last 72 hours. -------------------------------------------------------------------------------------------------------------------  Cardiac Enzymes  Recent Labs Lab 03/18/16 0219  TROPONINI <0.03   ------------------------------------------------------------------------------------------------------------------ Invalid input(s): POCBNP  ---------------------------------------------------------------------------------------------------------------  Urinalysis    Component Value Date/Time   COLORURINE STRAW (A) 08/04/2014 0918   APPEARANCEUR CLEAR (A) 08/04/2014 0918   APPEARANCEUR Clear 01/11/2012 2018   LABSPEC 1.012 08/04/2014 0918   LABSPEC 1.031 01/11/2012 2018   PHURINE 7.0 08/04/2014 0918   GLUCOSEU >500 (A) 08/04/2014 0918   GLUCOSEU >=500 01/11/2012 2018   HGBUR NEGATIVE 08/04/2014 0918   BILIRUBINUR NEGATIVE 08/04/2014 0918   BILIRUBINUR Negative 01/11/2012 2018   KETONESUR 1+ (A) 08/04/2014 0918   PROTEINUR NEGATIVE 08/04/2014 0918   NITRITE NEGATIVE 08/04/2014 0918   LEUKOCYTESUR NEGATIVE 08/04/2014 0918   LEUKOCYTESUR Negative 01/11/2012 2018     RADIOLOGY: Ct Head Wo Contrast  Result Date: 03/18/2016  CLINICAL DATA:  Syncopal episode at work. Diabetic. Neck pain after the fall. History of stroke  in July 2017. History of hypertension. EXAM: CT HEAD WITHOUT CONTRAST CT CERVICAL SPINE WITHOUT CONTRAST TECHNIQUE: Multidetector CT imaging of the head and cervical spine was performed following the standard protocol without intravenous contrast. Multiplanar CT image reconstructions of the cervical spine were also generated. COMPARISON:  MRI brain 10/03/2015.  CT head 10/03/2015 FINDINGS: CT HEAD FINDINGS Brain: No evidence of acute infarction, hemorrhage, hydrocephalus, extra-axial collection or mass lesion/mass effect. 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 Vascular: No hyperdense vessel or unexpected calcification. Skull: Normal. Negative for fracture or focal lesion. Sinuses/Orbits: No acute finding. Other: None. CT CERVICAL SPINE FINDINGS Alignment: There is reversal of the usual cervical lordosis without anterior subluxation. Normal alignment of the facet joints. Is likely due to patient positioning or degenerative change but ligamentous injury or muscle spasm could also have this appearance and are not excluded. C1-2 articulation appears intact. Skull base and vertebrae: No acute fracture. No primary bone lesion or focal pathologic process. Soft tissues and spinal canal: No prevertebral fluid or swelling. No visible canal hematoma. Disc levels: Degenerative changes with narrowed interspaces and endplate hypertrophic changes most prominent at C4-5, C5-6, and C6-7 levels. Upper chest: Negative. Other: None. IMPRESSION: No acute intracranial abnormalities. Nonspecific reversal of the usual cervical lordosis. Degenerative changes. No acute displaced cervical spine fractures. Electronically Signed   By: Lucienne Capers M.D.   On: 03/18/2016 03:39   Ct Cervical Spine Wo Contrast  Result Date: 03/18/2016 CLINICAL DATA:  Syncopal episode at work. Diabetic. Neck pain after the fall. History of stroke in July 2017. History of hypertension. EXAM: CT HEAD WITHOUT CONTRAST CT CERVICAL SPINE WITHOUT CONTRAST TECHNIQUE:  Multidetector CT imaging of the head and cervical spine was performed following the standard protocol without intravenous contrast. Multiplanar CT image reconstructions of the cervical spine were also generated. COMPARISON:  MRI brain 10/03/2015.  CT head 10/03/2015 FINDINGS: CT HEAD FINDINGS Brain: No evidence of acute infarction, hemorrhage, hydrocephalus, extra-axial collection or mass lesion/mass effect. 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 Vascular: No hyperdense vessel or unexpected calcification. Skull: Normal. Negative for fracture or focal lesion. Sinuses/Orbits: No acute finding. Other: None. CT CERVICAL SPINE FINDINGS Alignment: There is reversal of the usual cervical lordosis without anterior subluxation. Normal alignment of the facet joints. Is likely due to patient positioning or degenerative change but ligamentous injury or muscle spasm could also have this appearance and are not excluded. C1-2 articulation appears intact. Skull base and vertebrae: No acute fracture. No primary bone lesion or focal pathologic process. Soft tissues and spinal canal: No prevertebral fluid or swelling. No visible canal hematoma. Disc levels: Degenerative changes with narrowed interspaces and endplate hypertrophic changes most prominent at C4-5, C5-6, and C6-7 levels. Upper chest: Negative. Other: None. IMPRESSION: No acute intracranial abnormalities. Nonspecific reversal of the usual cervical lordosis. Degenerative changes. No acute displaced cervical spine fractures. Electronically Signed   By: Lucienne Capers M.D.   On: 03/18/2016 03:39    EKG: Orders placed or performed during the hospital encounter of 03/18/16  . EKG 12-Lead  . EKG 12-Lead  . ED EKG  . ED EKG    IMPRESSION AND PLAN: 51 year old female patient with history of diabetes mellitus type 2, hypertension presented to emergency room with fatigue, weakness and passing out. Admitting diagnosis 1. Symptomatic anemia 2. Hypokalemia 3. Syncope  probably secondary to anemia 4. Dehydration 5. Diabetes mellitus type 2 Treatment plan Admit patient to medical floor  Replace potassium Gastroenterology and hematology consultation for workup of anemia Monitor hemoglobin and hematocrit IV fluid hydration Sequential compression device to lower extremities for DVT prophylaxis.  All the records are reviewed and case discussed with ED provider. Management plans discussed with the patient, family and they are in agreement.  CODE STATUS:FULL CODE Code Status History    Date Active Date Inactive Code Status Order ID Comments User Context   10/03/2015  8:01 AM 10/03/2015  9:24 AM Full Code [id number]  Saundra Shelling, MD Inpatient       TOTAL TIME TAKING CARE OF THIS PATIENT: 52 minutes.    Saundra Shelling M.D on 03/18/2016 at 6:30 AM  Between 7am to 6pm - Pager - [phone]  After 6pm go to [email or web address] - password [email or web address] [facility]  [place] [facility]  Office  [phone]  CC: Primary care physician; No PCP Per Patient

## 2016-03-18 NOTE — ED Notes (Addendum)
Supervisor back into ED lobby, st his name is Production assistant, radio (supervisor with [employer]); instructed that he will need to inform his employee of his desire to perform random urine  drug screen; female over to speak with pt regarding such

## 2016-03-18 NOTE — ED Provider Notes (Signed)
[facility] Emergency Department Provider Note   ____________________________________________   First MD Initiated Contact with Patient 03/18/16 [phone]     (approximate)  I have reviewed the triage vital signs and the nursing notes.   HISTORY  Chief Complaint Loss of Consciousness    HPI Lori Larsen is a 51 y.o. female who comes into the hospital today with a syncopal episode. The patient was walking at work and she passed out. She is unsure exactly how long she was out for but she did hit her head and has some neck pain. The patient denies any chest pain or shortness of breath. She's never passed out with this before. She denies any nausea or vomiting and denies any diarrhea. The patient has not had any significant vaginal bleeding and has not had a cycle since September. The patient has no abdominal pain but has been told she was anemic in the past. The patient takes iron 2 times a day. The patient reports that she has not been vomiting blood. She is here today for evaluation from work.   Past Medical History:  Diagnosis Date  . Diabetes mellitus without complication ([place])   . Hypertension     Patient Active Problem List   Diagnosis Date Noted  . CVA (cerebral infarction) 10/03/2015    Past Surgical History:  Procedure Laterality Date  . CESAREAN SECTION      Prior to Admission medications   Medication Sig Start Date End Date Taking? Authorizing Provider  aspirin 325 MG tablet Take 1 tablet (325 mg total) by mouth daily. 10/04/15  Yes Bettey Costa, MD  atorvastatin (LIPITOR) 40 MG tablet Take 1 tablet (40 mg total) by mouth daily at 6 PM. 10/04/15  Yes Sital Mody, MD  clopidogrel (PLAVIX) 75 MG tablet Take 75 mg by mouth daily.   Yes Historical Provider, MD  ferrous sulfate 325 (65 FE) MG tablet Take 1 tablet by mouth 2 (two) times daily. 02/04/16  Yes Historical Provider, MD  gabapentin (NEURONTIN) 100 MG capsule Take 1 capsule by mouth 2  (two) times daily.  03/18/16  Yes Historical Provider, MD  glipiZIDE (GLUCOTROL) 5 MG tablet Take 1 tablet (5 mg total) by mouth daily. 09/27/15 09/26/16 Yes Earleen Newport, MD  hydrochlorothiazide (HYDRODIURIL) 25 MG tablet Take 1 tablet by mouth daily. 12/25/15  Yes Historical Provider, MD  lisinopril (PRINIVIL,ZESTRIL) 40 MG tablet Take 1 tablet by mouth every morning. 02/04/16  Yes Historical Provider, MD  meclizine (ANTIVERT) 25 MG tablet Take 1 tablet (25 mg total) by mouth 3 (three) times daily as needed for dizziness or nausea. 10/26/14  Yes Earleen Newport, MD  TRESIBA FLEXTOUCH 100 UNIT/ML SOPN FlexTouch Pen Inject 17 Units into the skin every morning. 01/25/16  Yes Historical Provider, MD  nicotine (NICODERM CQ - DOSED IN MG/24 HOURS) 21 mg/24hr patch Place 1 patch (21 mg total) onto the skin daily. Patient not taking: Reported on 03/18/2016 10/04/15   Bettey Costa, MD  oxyCODONE (ROXICODONE) 5 MG immediate release tablet Take 1 tablet (5 mg total) by mouth every 6 (six) hours as needed for moderate pain. Do not drive while taking this medication. Patient not taking: Reported on 03/18/2016 08/04/14   Joanne Gavel, MD    Allergies Patient has no known allergies.  Family History  Problem Relation Age of Onset  . Hypertension Mother   . Hypertension Father   . Breast cancer Neg Hx     Social History Social History  Substance Use Topics  . Smoking status: Current Some Day Smoker    Packs/day: 0.50    Years: 20.00    Types: Cigarettes  . Smokeless tobacco: Never Used  . Alcohol use Yes     Comment:  occasionally drinks beer    Review of Systems Constitutional: No fever/chills Eyes: No visual changes. ENT: No sore throat. Cardiovascular: Denies chest pain. Respiratory: Denies shortness of breath. Gastrointestinal: No abdominal pain.  No nausea, no vomiting.  No diarrhea.  No constipation. Genitourinary: Negative for dysuria. Musculoskeletal: Negative for back pain. Skin:  Negative for rash. Neurological: Syncope  10-point ROS otherwise negative.  ____________________________________________   PHYSICAL EXAM:  VITAL SIGNS: ED Triage Vitals  Enc Vitals Group     BP 03/18/16 0217 (!) 155/87     Pulse Rate 03/18/16 0217 81     Resp 03/18/16 0217 20     Temp 03/18/16 0217 98.1 F (36.7 C)     Temp Source 03/18/16 0217 Oral     SpO2 03/18/16 0217 99 %     Weight 03/18/16 0218 140 lb (63.5 kg)     Height 03/18/16 0218 5' (1.524 m)     Head Circumference --      Peak Flow --      Pain Score 03/18/16 0218 8     Pain Loc --      Pain Edu? --      Excl. in [place]? --     Constitutional: Alert and oriented. Well appearing and in no acute distress. Eyes: Conjunctivae are normal. PERRL. EOMI. Head: Atraumatic. Nose: No congestion/rhinnorhea. Mouth/Throat: Mucous membranes are moist.  Oropharynx non-erythematous. Neck: cervical spine tenderness to palpation. Cardiovascular: Normal rate, regular rhythm. Grossly normal heart sounds.  Good peripheral circulation. Respiratory: Normal respiratory effort.  No retractions. Lungs CTAB. Gastrointestinal: Soft and nontender. No distention. Positive bowel sounds, dark stool is guaiac negative Musculoskeletal: No lower extremity tenderness nor edema.   Neurologic:  Normal speech and language. Cranial nerves II through XII are grossly intact with no focal motor or neuro deficits Skin:  Skin is warm, dry and intact.  Psychiatric: Mood and affect are normal.   ____________________________________________   LABS (all labs ordered are listed, but only abnormal results are displayed)  Labs Reviewed  BASIC METABOLIC PANEL - Abnormal; Notable for the following:       Result Value   Potassium 3.3 (*)    Glucose, Bld 270 (*)    BUN 23 (*)    Creatinine, Ser 1.09 (*)    GFR calc non Af Amer 58 (*)    All other components within normal limits  CBC - Abnormal; Notable for the following:    RBC 2.21 (*)    Hemoglobin  6.9 (*)    HCT 20.1 (*)    All other components within normal limits  TROPONIN I  URINALYSIS, COMPLETE (UACMP) WITH MICROSCOPIC  TYPE AND SCREEN   ____________________________________________  EKG  ED ECG REPORT I, Loney Hering, the attending physician, personally viewed and interpreted this ECG.   Date: 03/18/2016  EKG Time: 221  Rate: 85  Rhythm: normal sinus rhythm  Axis: normal  Intervals:none  ST&T Change: none  ____________________________________________  RADIOLOGY  CT head and cervical spine ____________________________________________   PROCEDURES  Procedure(s) performed: None  Procedures  Critical Care performed: No  ____________________________________________   INITIAL IMPRESSION / ASSESSMENT AND PLAN / ED COURSE  Pertinent labs & imaging results that were available during my care of  the patient were reviewed by me and considered in my medical decision making (see chart for details).  This is a 51 year old female who comes into the hospital today with syncope. She reports that she was simply walking and passed out. The patient had some blood work done and she does have a hemoglobin of 6.9 and a hematocrit of 20. The patient's last H&H was around 10 and 30. The patient reports that she has been taking iron and has no other complaints. Given the patient's syncope in this low hemoglobin and hematocrit I will admit the patient. Her guaiac was negative so at this time I am unsure why the patient is anemic. The patient will be admitted to the hospitalist service.  Clinical Course as of Mar 18 610  Tue Mar 18, 2016  0423 No acute intracranial abnormalities.  Nonspecific reversal of the usual cervical lordosis. Degenerative changes. No acute displaced cervical spine fractures.   CT Cervical Spine Wo Contrast [AW]    Clinical Course User Index [AW] Loney Hering, MD     ____________________________________________   FINAL CLINICAL  IMPRESSION(S) / ED DIAGNOSES  Final diagnoses:  Syncope, unspecified syncope type  Anemia, unspecified type      NEW MEDICATIONS STARTED DURING THIS VISIT:  New Prescriptions   No medications on file     Note:  This document was prepared using Dragon voice recognition software and may include unintentional dictation errors.    Loney Hering, MD 03/18/16 [phone]

## 2016-03-18 NOTE — ED Notes (Signed)
Charge nurse notified of pt's H&H

## 2016-03-19 LAB — TYPE AND SCREEN
ABO/RH(D): O POS
Antibody Screen: NEGATIVE
Unit division: 0

## 2016-03-19 LAB — CBC
HCT: 35 % (ref 35.0–47.0)
Hemoglobin: 11.9 g/dL — ABNORMAL LOW (ref 12.0–16.0)
MCH: 30.4 pg (ref 26.0–34.0)
MCHC: 33.9 g/dL (ref 32.0–36.0)
MCV: 89.7 fL (ref 80.0–100.0)
PLATELETS: 222 10*3/uL (ref 150–440)
RBC: 3.9 MIL/uL (ref 3.80–5.20)
RDW: 13.4 % (ref 11.5–14.5)
WBC: 11.7 10*3/uL — AB (ref 3.6–11.0)

## 2016-03-19 LAB — BASIC METABOLIC PANEL
Anion gap: 6 (ref 5–15)
BUN: 19 mg/dL (ref 6–20)
CO2: 23 mmol/L (ref 22–32)
CREATININE: 0.77 mg/dL (ref 0.44–1.00)
Calcium: 8.6 mg/dL — ABNORMAL LOW (ref 8.9–10.3)
Chloride: 107 mmol/L (ref 101–111)
GFR calc Af Amer: >60 mL/min (ref >60)
Glucose, Bld: 236 mg/dL — ABNORMAL HIGH (ref 65–99)
Potassium: 3.9 mmol/L (ref 3.5–5.1)
SODIUM: 136 mmol/L (ref 135–145)

## 2016-03-19 LAB — GLUCOSE, CAPILLARY
GLUCOSE-CAPILLARY: 81 mg/dL (ref 65–99)
Glucose-Capillary: 222 mg/dL — ABNORMAL HIGH (ref 65–99)
Glucose-Capillary: 249 mg/dL — ABNORMAL HIGH (ref 65–99)
Glucose-Capillary: 173 mg/dL — ABNORMAL HIGH (ref 65–99)

## 2016-03-19 MED ORDER — INSULIN GLARGINE 100 UNIT/ML ~~LOC~~ SOLN
6.0000 [IU] | SUBCUTANEOUS | Status: DC
  Administered 2016-03-19 – 2016-03-20 (×2): 6 [IU] via SUBCUTANEOUS
  Filled 2016-03-19 (×3): qty 0.06

## 2016-03-19 NOTE — Progress Notes (Signed)
Patient steady when ambulating to the bathroom.  Sleepy today and taking multiple naps

## 2016-03-19 NOTE — Progress Notes (Signed)
[place] [facility] [place] NAME: Lori Larsen    MR#:  [id number]  DATE OF BIRTH:  [birth year]  SUBJECTIVE: Admitted yesterday for syncope, anemia with hemoglobin 6.9. Patient received 1 unit of transfusion, hemoglobin improved to 11.9. Patient data and studies normal. She was on aspirin, Plavix because of previous stroke. Patient to last dose of Plavix on Monday morning.   CHIEF COMPLAINT:   Chief Complaint  Patient presents with  . Loss of Consciousness    REVIEW OF SYSTEMS:   ROS CONSTITUTIONAL: No fever, fatigue or weakness.  EYES: No blurred or double vision.  EARS, NOSE, AND THROAT: No tinnitus or ear pain.  RESPIRATORY: No cough, shortness of breath, wheezing or hemoptysis.  CARDIOVASCULAR: No chest pain, orthopnea, edema.  GASTROINTESTINAL: No nausea, vomiting, diarrhea or abdominal pain.  GENITOURINARY: No dysuria, hematuria.  ENDOCRINE: No polyuria, nocturia,  HEMATOLOGY: No anemia, easy bruising or bleeding SKIN: No rash or lesion. MUSCULOSKELETAL: No joint pain or arthritis.   NEUROLOGIC: No tingling, numbness, weakness.  PSYCHIATRY: No anxiety or depression.   DRUG ALLERGIES:  No Known Allergies  VITALS:  Blood pressure 119/70, pulse 70, temperature 98.4 F (36.9 C), temperature source Oral, resp. rate 16, height 5' (1.524 m), weight 63.5 kg (140 lb), SpO2 99 %.  PHYSICAL EXAMINATION:  GENERAL:  51 y.o.-year-old patient lying in the bed with no acute distress.  EYES: Pupils equal, round, reactive to light and accommodation. No scleral icterus. Extraocular muscles intact.  HEENT: Head atraumatic, normocephalic. Oropharynx and nasopharynx clear.  NECK:  Supple, no jugular venous distention. No thyroid enlargement, no tenderness.  LUNGS: Normal breath sounds bilaterally, no wheezing, rales,rhonchi or crepitation. No use of accessory muscles of respiration.  CARDIOVASCULAR: S1, S2 normal. No murmurs, rubs, or gallops.   ABDOMEN: Soft, nontender, nondistended. Bowel sounds present. No organomegaly or mass.  EXTREMITIES: No pedal edema, cyanosis, or clubbing.  NEUROLOGIC: Cranial nerves II through XII are intact. Muscle strength 5/5 in all extremities. Sensation intact. Gait not checked.  PSYCHIATRIC: The patient is alert and oriented x 3.  SKIN: No obvious rash, lesion, or ulcer.    LABORATORY PANEL:   CBC  Recent Labs Lab 03/19/16 0651  WBC 11.7*  HGB 11.9*  HCT 35.0  PLT 222   ------------------------------------------------------------------------------------------------------------------  Chemistries   Recent Labs Lab 03/19/16 0651  NA 136  K 3.9  CL 107  CO2 23  GLUCOSE 236*  BUN 19  CREATININE 0.77  CALCIUM 8.6*   ------------------------------------------------------------------------------------------------------------------  Cardiac Enzymes  Recent Labs Lab 03/18/16 0219  TROPONINI <0.03   ------------------------------------------------------------------------------------------------------------------  RADIOLOGY:  Ct Head Wo Contrast  Result Date: 03/18/2016 CLINICAL DATA:  Syncopal episode at work. Diabetic. Neck pain after the fall. History of stroke in July 2017. History of hypertension. EXAM: CT HEAD WITHOUT CONTRAST CT CERVICAL SPINE WITHOUT CONTRAST TECHNIQUE: Multidetector CT imaging of the head and cervical spine was performed following the standard protocol without intravenous contrast. Multiplanar CT image reconstructions of the cervical spine were also generated. COMPARISON:  MRI brain 10/03/2015.  CT head 10/03/2015 FINDINGS: CT HEAD FINDINGS Brain: No evidence of acute infarction, hemorrhage, hydrocephalus, extra-axial collection or mass lesion/mass effect. 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 Vascular: No hyperdense vessel or unexpected calcification. Skull: Normal. Negative for fracture or focal lesion. Sinuses/Orbits: No acute finding. Other: None. CT CERVICAL  SPINE FINDINGS Alignment: There is reversal of the usual cervical lordosis without anterior subluxation. Normal alignment of the facet joints. Is likely due to  patient positioning or degenerative change but ligamentous injury or muscle spasm could also have this appearance and are not excluded. C1-2 articulation appears intact. Skull base and vertebrae: No acute fracture. No primary bone lesion or focal pathologic process. Soft tissues and spinal canal: No prevertebral fluid or swelling. No visible canal hematoma. Disc levels: Degenerative changes with narrowed interspaces and endplate hypertrophic changes most prominent at C4-5, C5-6, and C6-7 levels. Upper chest: Negative. Other: None. IMPRESSION: No acute intracranial abnormalities. Nonspecific reversal of the usual cervical lordosis. Degenerative changes. No acute displaced cervical spine fractures. Electronically Signed   By: Lucienne Capers M.D.   On: 03/18/2016 03:39   Ct Cervical Spine Wo Contrast  Result Date: 03/18/2016 CLINICAL DATA:  Syncopal episode at work. Diabetic. Neck pain after the fall. History of stroke in July 2017. History of hypertension. EXAM: CT HEAD WITHOUT CONTRAST CT CERVICAL SPINE WITHOUT CONTRAST TECHNIQUE: Multidetector CT imaging of the head and cervical spine was performed following the standard protocol without intravenous contrast. Multiplanar CT image reconstructions of the cervical spine were also generated. COMPARISON:  MRI brain 10/03/2015.  CT head 10/03/2015 FINDINGS: CT HEAD FINDINGS Brain: No evidence of acute infarction, hemorrhage, hydrocephalus, extra-axial collection or mass lesion/mass effect. 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 Vascular: No hyperdense vessel or unexpected calcification. Skull: Normal. Negative for fracture or focal lesion. Sinuses/Orbits: No acute finding. Other: None. CT CERVICAL SPINE FINDINGS Alignment: There is reversal of the usual cervical lordosis without anterior subluxation. Normal  alignment of the facet joints. Is likely due to patient positioning or degenerative change but ligamentous injury or muscle spasm could also have this appearance and are not excluded. C1-2 articulation appears intact. Skull base and vertebrae: No acute fracture. No primary bone lesion or focal pathologic process. Soft tissues and spinal canal: No prevertebral fluid or swelling. No visible canal hematoma. Disc levels: Degenerative changes with narrowed interspaces and endplate hypertrophic changes most prominent at C4-5, C5-6, and C6-7 levels. Upper chest: Negative. Other: None. IMPRESSION: No acute intracranial abnormalities. Nonspecific reversal of the usual cervical lordosis. Degenerative changes. No acute displaced cervical spine fractures. Electronically Signed   By: Lucienne Capers M.D.   On: 03/18/2016 03:39    EKG:   Orders placed or performed during the hospital encounter of 03/18/16  . EKG 12-Lead  . EKG 12-Lead  . ED EKG  . ED EKG    ASSESSMENT AND PLAN:   Acute symptomatic anemia with hemoglobin 6.9 on admission causing syncope:; Patient received 1 unit of packed RBC, hemoglobin improved to 11.9. Iron studies normal. Seen by hematology, gastroenterology. Patient never had workup fall for EGD or colonoscopy. Because of symptomatic anemia and syncope gastroenterology recommended EGD and colonoscopy. Because patient on is on Plavix, if patient needs any polypectomy gastroenterology said they cannot do it because patient is on Plavix. Patient last dose of Plavix as Monday, 2 days ago. 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 Neurology recommended to hold the Plavix for 5 days is fine. I will convey this to gastroenterology.  #2 possible acute gastritis due to aspirin, Plavix: Continue PPIs, seen by gastroenterology, patient needs EGD and colonoscopy. Patient prefers this is while she is  inpatient.  #3.. Diabetes mellitus type 2: Seen by diabetic nurse, continue home medications, start Lantus.  #4/hyperlipidemia: Continue statins.   5.hypokalemia; replace the potassium  6 for acute anemia patient seen by hematology, workup is ordered for hemolysis, possible  light chain disease.  All the records are reviewed and case discussed with Care Management/Social Workerr. Management plans discussed with the patient, family and they are in agreement.  CODE STATUS: full  TOTAL TIME TAKING CARE OF THIS PATIENT: 35  minutes.   POSSIBLE D/C IN 1-2  DAYS, DEPENDING ON CLINICAL CONDITION.   Epifanio Lesches M.D on 03/19/2016 at 12:46 PM  Between 7am to 6pm - Pager - [phone]  After 6pm go to [email or web address] - password [email or web address]  [facility]  Office  [phone]  CC: Primary care physician; No PCP Per Patient   Note: This dictation was prepared with Dragon dictation along with smaller phrase technology. Any transcriptional errors that result from this process are unintentional.

## 2016-03-19 NOTE — Progress Notes (Signed)
Inpatient Diabetes Program Recommendations  AACE/ADA: New Consensus Statement on Inpatient Glycemic Control (2015)  Target Ranges:  Prepandial:   less than 140 mg/dL      Peak postprandial:   less than 180 mg/dL (1-2 hours)      Critically ill patients:  140 - 180 mg/dL   Results for ORIANE, WIELGUS (MRN [id number]) as of 03/19/2016 09:29  Ref. Range 03/18/2016 11:57 03/18/2016 16:33 03/18/2016 21:01 03/19/2016 07:56  Glucose-Capillary Latest Ref Range: 65 - 99 mg/dL 379 (H) 174 (H) 216 (H) 222 (H)   Review of Glycemic Control  Diabetes history: DM2 Outpatient Diabetes medications: Glipizide 5 mg QAM, Tresiba 17 units QPM Current orders for Inpatient glycemic control: Novolog 0-15 units TID with meals, Novolog 0-5 units QHS  Inpatient Diabetes Program Recommendations:  Insulin - Basal: Please consider ordering Lantus 6 units Q24H starting now.  Thanks, Barnie Alderman, RN, MSN, CDE Diabetes Coordinator Inpatient Diabetes Program [phone] (Team Pager from 8am to 5pm)

## 2016-03-20 ENCOUNTER — Telehealth: Payer: Self-pay | Admitting: Family Medicine

## 2016-03-20 DIAGNOSIS — D649 Anemia, unspecified: Secondary | ICD-10-CM | POA: Diagnosis not present

## 2016-03-20 LAB — CBC WITH DIFFERENTIAL/PLATELET
Basophils Absolute: 0 10*3/uL (ref 0–0.1)
Basophils Relative: 1 %
EOS ABS: 0.3 10*3/uL (ref 0–0.7)
Eosinophils Relative: 3 %
HCT: 33.4 % — ABNORMAL LOW (ref 35.0–47.0)
HEMOGLOBIN: 11.5 g/dL — AB (ref 12.0–16.0)
Lymphocytes Relative: 32 %
Lymphs Abs: 3.1 10*3/uL (ref 1.0–3.6)
MCH: 30.2 pg (ref 26.0–34.0)
MCHC: 34.3 g/dL (ref 32.0–36.0)
MCV: 88 fL (ref 80.0–100.0)
Monocytes Absolute: 0.5 10*3/uL (ref 0.2–0.9)
Monocytes Relative: 5 %
NEUTROS PCT: 59 %
Neutro Abs: 5.8 10*3/uL (ref 1.4–6.5)
Platelets: 211 10*3/uL (ref 150–440)
RBC: 3.8 MIL/uL (ref 3.80–5.20)
RDW: 13.3 % (ref 11.5–14.5)
WBC: 9.7 10*3/uL (ref 3.6–11.0)

## 2016-03-20 LAB — KAPPA/LAMBDA LIGHT CHAINS
KAPPA FREE LGHT CHN: 42 mg/L — AB (ref 3.3–19.4)
KAPPA, LAMDA LIGHT CHAIN RATIO: 1.42 (ref 0.26–1.65)
Lambda free light chains: 29.5 mg/L — ABNORMAL HIGH (ref 5.7–26.3)

## 2016-03-20 LAB — GLUCOSE, CAPILLARY
GLUCOSE-CAPILLARY: 236 mg/dL — AB (ref 65–99)
Glucose-Capillary: 170 mg/dL — ABNORMAL HIGH (ref 65–99)

## 2016-03-20 LAB — PATHOLOGIST SMEAR REVIEW

## 2016-03-20 LAB — HAPTOGLOBIN: HAPTOGLOBIN: 210 mg/dL — AB (ref 34–200)

## 2016-03-20 MED ORDER — PANTOPRAZOLE SODIUM 40 MG PO TBEC: 40.0000 mg | DELAYED_RELEASE_TABLET | Freq: Every day | ORAL | 0 refills | Status: DC

## 2016-03-20 NOTE — Telephone Encounter (Signed)
Pt is being discharged from [facility] today for anemia.  I have scheduled a hospital follow up appointment/MW °

## 2016-03-20 NOTE — Discharge Summary (Signed)
Lori Larsen, is a 51 y.o. female  DOB 09/22/1965  MRN [id number].  Admission date:  03/18/2016  Admitting Physician  Saundra Shelling, MD  Discharge Date:  03/20/2016   Primary MD  No PCP Per Patient  Recommendations for primary care physician for things to follow:   Primary doctor DR.Rosanna Randy on Monday for rpt cbc.   Admission Diagnosis  Syncope, unspecified syncope type [R55] Anemia, unspecified type [D64.9]   Discharge Diagnosis  Syncope, unspecified syncope type [R55] Anemia, unspecified type [D64.9]    Principal Problem:   Syncope Active Problems:   Anemia      Past Medical History:  Diagnosis Date  . Diabetes mellitus without complication ([place])   . Hypertension     Past Surgical History:  Procedure Laterality Date  . CESAREAN SECTION         History of present illness and  Hospital Course:     Kindly see H&P for history of present illness and admission details, please review complete Labs, Consult reports and Test reports for all details in brief  HPI  from the history and physical done on the day of admission 51 year old female patient with history of essential hypertension, diabetes mellitus type 2, history of previous stroke with some right-sided weakness comes in because of syncope, found to have symptomatic anemia with hemoglobin   6.9, hematocrit 20.1.  Hospital Course   #1 syncope with symptomatic acute symptomatic anemia: Admitted to hospitalist service, hemoglobin on admission 6.9, hematocrit 20.1, patient received 1 unit of packed RBC transfusion, hemoglobin stayed stable at more than 10, today it is 11.5. recently seen by gastroenterology, hematology oncology. Because History of stroke started on Plavix by  Dr.Potter Since 10/2015. We  stopped  ASA,Plavix on this admission. Last dose of  Plavix on Monday at home ,that is  January 8. Patient needs to be off Plavix for 5 days for gastroenterology to do any intervention endoscopically before. Because patient has no mass evidence of bleeding like melena, discharge the patient home. Discussed this with Gastroenterology. He recommended to restart aspirin, Plavix, his office will call patient for appointment for scheduling EGD and colonoscopy. Patient understood that. So continue aspirin, Plavix, iron supplements, PPIs. Patient if he she gets any gross bleeding advised  Her to come to ER right away,, stop Plavix    Discharge Condition: stable   Follow UP  Follow-up Information    Jonathon Bellows, MD Follow up in 1 week(s).   Specialty:  Surgery Contact information: [address] [address] [phone]             Discharge Instructions  and  Discharge Medications     Allergies as of 03/20/2016   No Known Allergies     Medication List    STOP taking these medications   oxyCODONE 5 MG immediate release tablet Commonly known as:  ROXICODONE     TAKE these medications   aspirin 325 MG tablet Take 1 tablet (325 mg total) by mouth daily.   atorvastatin 40 MG tablet Commonly known as:  LIPITOR Take 1 tablet (40 mg total) by mouth daily at 6 PM.   clopidogrel 75 MG tablet Commonly known as:  PLAVIX Take 75 mg by mouth daily.   ferrous sulfate 325 (65 FE) MG tablet Take 1 tablet by mouth 2 (two) times daily.   gabapentin 100 MG capsule Commonly known as:  NEURONTIN Take 1 capsule by mouth 2 (two) times daily.   glipiZIDE  5 MG tablet Commonly known as:  GLUCOTROL Take 1 tablet (5 mg total) by mouth daily.   hydrochlorothiazide 25 MG tablet Commonly known as:  HYDRODIURIL Take 1 tablet by mouth daily.   lisinopril 40 MG tablet Commonly known as:  PRINIVIL,ZESTRIL Take 1 tablet by mouth every morning.   meclizine 25 MG tablet Commonly known as:  ANTIVERT Take 1 tablet (25 mg total)  by mouth 3 (three) times daily as needed for dizziness or nausea.   nicotine 21 mg/24hr patch Commonly known as:  NICODERM CQ - dosed in mg/24 hours Place 1 patch (21 mg total) onto the skin daily.   pantoprazole 40 MG tablet Commonly known as:  PROTONIX Take 1 tablet (40 mg total) by mouth daily.   TRESIBA FLEXTOUCH 100 UNIT/ML Sopn FlexTouch Pen Generic drug:  insulin degludec Inject 17 Units into the skin every morning.         Diet and Activity recommendation: See Discharge Instructions above   Consults obtained -stable   Major procedures and Radiology Reports - PLEASE review detailed and final reports for all details, in brief -     Ct Head Wo Contrast  Result Date: 03/18/2016 CLINICAL DATA:  Syncopal episode at work. Diabetic. Neck pain after the fall. History of stroke in July 2017. History of hypertension. EXAM: CT HEAD WITHOUT CONTRAST CT CERVICAL SPINE WITHOUT CONTRAST TECHNIQUE: Multidetector CT imaging of the head and cervical spine was performed following the standard protocol without intravenous contrast. Multiplanar CT image reconstructions of the cervical spine were also generated. COMPARISON:  MRI brain 10/03/2015.  CT head 10/03/2015 FINDINGS: CT HEAD FINDINGS Brain: No evidence of acute infarction, hemorrhage, hydrocephalus, extra-axial collection or mass lesion/mass effect. 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 Vascular: No hyperdense vessel or unexpected calcification. Skull: Normal. Negative for fracture or focal lesion. Sinuses/Orbits: No acute finding. Other: None. CT CERVICAL SPINE FINDINGS Alignment: There is reversal of the usual cervical lordosis without anterior subluxation. Normal alignment of the facet joints. Is likely due to patient positioning or degenerative change but ligamentous injury or muscle spasm could also have this appearance and are not excluded. C1-2 articulation appears intact. Skull base and vertebrae: No acute fracture. No primary bone lesion  or focal pathologic process. Soft tissues and spinal canal: No prevertebral fluid or swelling. No visible canal hematoma. Disc levels: Degenerative changes with narrowed interspaces and endplate hypertrophic changes most prominent at C4-5, C5-6, and C6-7 levels. Upper chest: Negative. Other: None. IMPRESSION: No acute intracranial abnormalities. Nonspecific reversal of the usual cervical lordosis. Degenerative changes. No acute displaced cervical spine fractures. Electronically Signed   By: Lucienne Capers M.D.   On: 03/18/2016 03:39   Ct Cervical Spine Wo Contrast  Result Date: 03/18/2016 CLINICAL DATA:  Syncopal episode at work. Diabetic. Neck pain after the fall. History of stroke in July 2017. History of hypertension. EXAM: CT HEAD WITHOUT CONTRAST CT CERVICAL SPINE WITHOUT CONTRAST TECHNIQUE: Multidetector CT imaging of the head and cervical spine was performed following the standard protocol without intravenous contrast. Multiplanar CT image reconstructions of the cervical spine were also generated. COMPARISON:  MRI brain 10/03/2015.  CT head 10/03/2015 FINDINGS: CT HEAD FINDINGS Brain: No evidence of acute infarction, hemorrhage, hydrocephalus, extra-axial collection or mass lesion/mass effect. 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 Vascular: No hyperdense vessel or unexpected calcification. Skull: Normal. Negative for fracture or focal lesion. Sinuses/Orbits: No acute finding. Other: None. CT CERVICAL SPINE FINDINGS Alignment: There is reversal of the usual cervical lordosis without anterior subluxation. Normal alignment of  the facet joints. Is likely due to patient positioning or degenerative change but ligamentous injury or muscle spasm could also have this appearance and are not excluded. C1-2 articulation appears intact. Skull base and vertebrae: No acute fracture. No primary bone lesion or focal pathologic process. Soft tissues and spinal canal: No prevertebral fluid or swelling. No visible canal  hematoma. Disc levels: Degenerative changes with narrowed interspaces and endplate hypertrophic changes most prominent at C4-5, C5-6, and C6-7 levels. Upper chest: Negative. Other: None. IMPRESSION: No acute intracranial abnormalities. Nonspecific reversal of the usual cervical lordosis. Degenerative changes. No acute displaced cervical spine fractures. Electronically Signed   By: Lucienne Capers M.D.   On: 03/18/2016 03:39    Micro Results    No results found for this or any previous visit (from the past 240 hour(s)).     Today   Subjective:   Lori Larsen today has no headache,no chest abdominal pain,no new weakness tingling or numbness, feels much better wants to go home today.  Objective:   Blood pressure 133/84, pulse 72, temperature 97.9 F (36.6 C), temperature source Oral, resp. rate 18, height 5' (1.524 m), weight 63.5 kg (140 lb), SpO2 98 %.   Intake/Output Summary (Last 24 hours) at 03/20/16 1417 Last data filed at 03/20/16 1032  Gross per 24 hour  Intake          1653.75 ml  Output              750 ml  Net           903.75 ml    Exam Awake Alert, Oriented x 3, No new F.N deficits, Normal affect [place].AT,PERRAL Supple Neck,No JVD, No cervical lymphadenopathy appriciated.  Symmetrical Chest wall movement, Good air movement bilaterally, CTAB RRR,No Gallops,Rubs or new Murmurs, No Parasternal Heave +ve B.Sounds, Abd Soft, Non tender, No organomegaly appriciated, No rebound -guarding or rigidity. No Cyanosis, Clubbing or edema, No new Rash or bruise  Data Review   CBC w Diff: Lab Results  Component Value Date   WBC 9.7 03/20/2016   HGB 11.5 (L) 03/20/2016   HGB 9.1 (L) 06/17/2014   HCT 33.4 (L) 03/20/2016   HCT 27.7 (L) 06/17/2014   PLT 211 03/20/2016   PLT 277 06/17/2014   LYMPHOPCT 32 03/20/2016   MONOPCT 5 03/20/2016   EOSPCT 3 03/20/2016   BASOPCT 1 03/20/2016    CMP: Lab Results  Component Value Date   NA 136 03/19/2016   NA 132 (L) 06/17/2014    K 3.9 03/19/2016   K 4.6 06/17/2014   CL 107 03/19/2016   CL 101 06/17/2014   CO2 23 03/19/2016   CO2 25 06/17/2014   BUN 19 03/19/2016   BUN 14 06/17/2014   CREATININE 0.77 03/19/2016   CREATININE 0.71 06/17/2014   PROT 8.1 10/26/2014   ALBUMIN 4.1 10/26/2014   BILITOT 0.5 10/26/2014   ALKPHOS 49 10/26/2014   AST 22 10/26/2014   ALT 12 (L) 10/26/2014  .   Total Time in preparing paper work, data evaluation and todays exam - 54 minutes  Derita Michelsen M.D on 03/20/2016 at 2:17 PM    Note: This dictation was prepared with Dragon dictation along with smaller phrase technology. Any transcriptional errors that result from this process are unintentional.

## 2016-03-20 NOTE — Progress Notes (Signed)
Inpatient Diabetes Program Recommendations  AACE/ADA: New Consensus Statement on Inpatient Glycemic Control (2015)  Target Ranges:  Prepandial:   less than 140 mg/dL      Peak postprandial:   less than 180 mg/dL (1-2 hours)      Critically ill patients:  140 - 180 mg/dL  Results for Lori Larsen, Lori Larsen (MRN [id number]) as of 03/20/2016 13:00  Ref. Range 03/19/2016 07:56 03/19/2016 11:55 03/19/2016 17:09 03/19/2016 20:58 03/20/2016 07:50 03/20/2016 11:23  Glucose-Capillary Latest Ref Range: 65 - 99 mg/dL 222 (H) 249 (H) 81 173 (H) 170 (H) 236 (H)    Review of Glycemic Control  Diabetes history: DM2 Outpatient Diabetes medications: Glipizide 5 mg QAM, Tresiba 17 units QPM Current orders for Inpatient glycemic control: Lantus 6 units Q24H, Novolog 0-15 units TID with meals, Novolog 0-5 units QHS, Glipizide 5 mg QAM  Inpatient Diabetes Program Recommendations:  Insulin - Basal: Please consider increasing Lantus to 8 units Q24H.  Thanks, Barnie Alderman, RN, MSN, CDE Diabetes Coordinator Inpatient Diabetes Program [phone] (Team Pager from 8am to 5pm)

## 2016-03-20 NOTE — Progress Notes (Signed)
Patient discharged to home follow up appointments given as ordered. Patient is alert and oriented no acute distress noted. Patient denies pain at this time. IV's discontinued site clean dry and intact.

## 2016-03-20 NOTE — Progress Notes (Signed)
No evidence of hemolysis. Myeloma labs pending. I will follow up with her as an outpatient in about 2 weeks from now. My office will call her with appointment. H/H significantly improved post transfusion.  Dr. Randa Evens, MD, MPH [facility] [phone] 03/20/2016 4:15 PM

## 2016-03-20 NOTE — Progress Notes (Addendum)
Jonathon Bellows MD [address], [place], [place] [address] Phone: [phone] Fax : [phone]  TYKISHA COURTEMANCHE is being followed for anemia    Subjective: No complaints, no bleeding reported   Objective: Vital signs in last 24 hours: Vitals:   03/19/16 1016 03/19/16 1311 03/19/16 2056 03/20/16 0414  BP: 119/70 100/81 (!) 141/85 133/84  Pulse: 70 67 67 72  Resp:  16 18 18   Temp:  98.4 F (36.9 C) 98.2 F (36.8 C) 97.9 F (36.6 C)  TempSrc:  Oral Oral Oral  SpO2:  97% 100% 98%  Weight:      Height:       Weight change:   Intake/Output Summary (Last 24 hours) at 03/20/16 1353 Last data filed at 03/20/16 1032  Gross per 24 hour  Intake          1893.75 ml  Output              750 ml  Net          1143.75 ml     Exam: Heart:: Regular rate and rhythm, S1S2 present or without murmur or extra heart sounds Lungs: normal and clear to auscultation Abdomen: soft, nontender, normal bowel sounds   Lab Results: CBC Latest Ref Rng & Units 03/20/2016 03/19/2016 03/18/2016  WBC 3.6 - 11.0 K/uL 9.7 11.7(H) -  Hemoglobin 12.0 - 16.0 g/dL 11.5(L) 11.9(L) 12.1  Hematocrit 35.0 - 47.0 % 33.4(L) 35.0 34.9(L)  Platelets 150 - 440 K/uL 211 222 -    Micro Results: No results found for this or any previous visit (from the past 240 hour(s)). Studies/Results: No results found. Medications: I have reviewed the patient's current medications. Scheduled Meds: . atorvastatin  40 mg Oral q1800  . ferrous sulfate  325 mg Oral BID  . gabapentin  100 mg Oral BID  . glipiZIDE  5 mg Oral Q breakfast  . insulin aspart  0-15 Units Subcutaneous TID WC  . insulin aspart  0-5 Units Subcutaneous QHS  . insulin glargine  6 Units Subcutaneous Q24H  . lisinopril  40 mg Oral q morning - 10a   Continuous Infusions: . sodium chloride 75 mL/hr at 03/20/16 0631   PRN Meds:.acetaminophen **OR** acetaminophen, meclizine, ondansetron **OR** ondansetron (ZOFRAN) IV, oxyCODONE,  senna-docusate   Assessment: Principal Problem:   Syncope Active Problems:   Anemia MEKHI MCNEELY is a 51 y.o. y/o female . I have been consulted for normocytic anemia. No overt blood loss. She is on plavix , asprin and has been on iron since September 2017. She has never had GI evaluation and she would need EGD+ colonoscopy . She would need 5 days off plavix and neurology are ok with holding plavix. Since she would not be able to have it done till Monday , she chose to have it done as an outpatient .    Plan: 1. OP GI follow up  2. Continue oral iron 3. CBC check by PCP on Monday  4. To call her PCP if she has overt bleeding  5. She has never had any overt bleeding and she can go back on plavix as long as she has close monitoring of her CBC with PCP office   I will sign off.  Please call me if any further GI concerns or questions.  We would like to thank you for the opportunity to participate in the care of Lori Larsen.    LOS: 2 days   Jonathon Bellows 03/20/2016, 1:53 PM

## 2016-03-21 ENCOUNTER — Other Ambulatory Visit: Payer: Self-pay | Admitting: *Deleted

## 2016-03-21 DIAGNOSIS — D649 Anemia, unspecified: Secondary | ICD-10-CM

## 2016-03-21 LAB — MULTIPLE MYELOMA PANEL, SERUM
ALBUMIN SERPL ELPH-MCNC: 3.4 g/dL (ref 2.9–4.4)
Albumin/Glob SerPl: 1 (ref 0.7–1.7)
Alpha 1: 0.2 g/dL (ref 0.0–0.4)
Alpha2 Glob SerPl Elph-Mcnc: 0.8 g/dL (ref 0.4–1.0)
B-GLOBULIN SERPL ELPH-MCNC: 1 g/dL (ref 0.7–1.3)
GLOBULIN, TOTAL: 3.5 g/dL (ref 2.2–3.9)
Gamma Glob SerPl Elph-Mcnc: 1.4 g/dL (ref 0.4–1.8)
IgA: 329 mg/dL (ref 87–352)
IgG (Immunoglobin G), Serum: 1364 mg/dL (ref 700–1600)
IgM, Serum: 106 mg/dL (ref 26–217)
PDF SPE: 0
Total Protein ELP: 6.9 g/dL (ref 6.0–8.5)

## 2016-03-24 ENCOUNTER — Inpatient Hospital Stay: Payer: Self-pay | Admitting: Family Medicine

## 2016-03-24 ENCOUNTER — Other Ambulatory Visit: Payer: Self-pay | Admitting: *Deleted

## 2016-03-24 LAB — UIFE/LIGHT CHAINS/TP QN, 24-HR UR
% BETA, URINE: 23.8 %
ALPHA 1 URINE: 2.2 %
ALPHA 2 UR: 7 %
Albumin, U: 38.8 %
FREE LAMBDA LT CHAINS, UR: 9.07 mg/L — AB (ref 0.24–6.66)
FREE LT CHN EXCR RATE: 186 mg/L — AB (ref 1.35–24.19)
Free Kappa/Lambda Ratio: 20.51 — ABNORMAL HIGH (ref 2.04–10.37)
GAMMA GLOBULIN URINE: 28.1 %
PDF-U24IEL: 0
TOTAL PROTEIN, URINE-UR/DAY: 89 mg/(24.h) (ref 30–150)
Time: 3368 hours
Total Protein, Urine: 11.9 mg/dL

## 2016-03-28 ENCOUNTER — Other Ambulatory Visit: Payer: Self-pay | Admitting: Internal Medicine

## 2016-03-28 DIAGNOSIS — R945 Abnormal results of liver function studies: Secondary | ICD-10-CM

## 2016-04-01 ENCOUNTER — Ambulatory Visit (INDEPENDENT_AMBULATORY_CARE_PROVIDER_SITE_OTHER): Payer: Managed Care, Other (non HMO) | Admitting: Gastroenterology

## 2016-04-01 ENCOUNTER — Encounter: Payer: Self-pay | Admitting: Gastroenterology

## 2016-04-01 VITALS — BP 130/86 | HR 66 | Ht 60.0 in | Wt 147.0 lb

## 2016-04-01 DIAGNOSIS — D509 Iron deficiency anemia, unspecified: Secondary | ICD-10-CM | POA: Diagnosis not present

## 2016-04-01 NOTE — Progress Notes (Signed)
Gastroenterology Consultation Primary Care Physician:  Nino Glow McLean-Scocuzza, MD Primary Gastroenterologist:  Dr. Jonathon Bellows  Reason for Consultation:     Anemia        HPI:   Lori Larsen is a 51 y.o. y/o Larsen referred for consultation & management  by Dr. Nino Glow McLean-Scocuzza, MD.    Summary of history :  She is here today for a hospital follow up . I was consulted to see her on 03/18/16 when she was brought to the ER after she passed out at work . She is on Plavix , asprin 325 mg . On admission found to have a Hb of 6.9 , MCV 91. No blood in urine. Her Hb was 9.1 in 06/2014, 10.9 in 09/2015 .09/2015 she was discharged with a CVA, was anemic with a Hb 10.9 . She denies any vaginal, rectal bleeding . No blood in urine or nasal bleeds. Denies any NSAID use. No prior colonoscopy or EGD. No abdominal pain either.    Interval history since discharge.   She is taking her iron . Denies any overt blood loss. She has never had a colonoscopy. No symptoms. She is on asprin and plavix.    Past Medical History:  Diagnosis Date  . Diabetes mellitus without complication ([place])   . Hypertension     Past Surgical History:  Procedure Laterality Date  . CESAREAN SECTION      Prior to Admission medications   Medication Sig Start Date End Date Taking? Authorizing Provider  amLODipine (NORVASC) 2.5 MG tablet Take 2.5 mg by mouth daily.   Yes Historical Provider, MD  aspirin 325 MG tablet Take 1 tablet (325 mg total) by mouth daily. 10/04/15  Yes Bettey Costa, MD  atorvastatin (LIPITOR) 40 MG tablet Take 1 tablet (40 mg total) by mouth daily at 6 PM. 10/04/15  Yes Sital Mody, MD  clopidogrel (PLAVIX) 75 MG tablet Take 75 mg by mouth daily.   Yes Historical Provider, MD  ferrous sulfate 325 (65 FE) MG tablet Take 1 tablet by mouth 2 (two) times daily. 02/04/16  Yes Historical Provider, MD  gabapentin (NEURONTIN) 100 MG capsule Take 1 capsule by mouth 2 (two) times daily.  03/18/16  Yes Historical  Provider, MD  glipiZIDE (GLUCOTROL) 5 MG tablet Take 1 tablet (5 mg total) by mouth daily. 09/27/15 09/26/16 Yes Earleen Newport, MD  hydrochlorothiazide (HYDRODIURIL) 25 MG tablet Take 1 tablet by mouth daily. 12/25/15  Yes Historical Provider, MD  lisinopril (PRINIVIL,ZESTRIL) 40 MG tablet Take 1 tablet by mouth every morning. 02/04/16  Yes Historical Provider, MD  meclizine (ANTIVERT) 25 MG tablet Take 1 tablet (25 mg total) by mouth 3 (three) times daily as needed for dizziness or nausea. 10/26/14  Yes Earleen Newport, MD  nicotine (NICODERM CQ - DOSED IN MG/24 HOURS) 21 mg/24hr patch Place 1 patch (21 mg total) onto the skin daily. 10/04/15  Yes Bettey Costa, MD  NOVOFINE PLUS 32G X 4 MM MISC  02/05/16  Yes Historical Provider, MD  pantoprazole (PROTONIX) 40 MG tablet Take 1 tablet (40 mg total) by mouth daily. 03/20/16  Yes Epifanio Lesches, MD  TRESIBA FLEXTOUCH 100 UNIT/ML SOPN FlexTouch Pen Inject 24 Units into the skin every morning.  01/25/16  Yes Historical Provider, MD    Family History  Problem Relation Age of Onset  . Hypertension Mother   . Hypertension Father   . Breast cancer Neg Hx      Social History  Substance  Use Topics  . Smoking status: Current Some Day Smoker    Packs/day: 0.50    Years: 20.00    Types: Cigarettes  . Smokeless tobacco: Never Used  . Alcohol use Yes     Comment:  occasionally drinks beer    Allergies as of 04/01/2016  . (No Known Allergies)    Review of Systems:    All systems reviewed and negative except where noted in HPI.   Physical Exam:  BP 130/86   Pulse 66   Ht 5' (1.524 m)   Wt 147 lb (66.7 kg)   BMI 28.71 kg/m  No LMP recorded. Psych:  Alert and cooperative. Normal mood and affect. General:   Alert,  Well-developed, well-nourished, pleasant and cooperative in NAD Head:  Normocephalic and atraumatic. Eyes:  Sclera clear, no icterus.   Conjunctiva pink. Ears:  Normal auditory acuity. Nose:  No deformity, discharge,  or lesions. Mouth:  No deformity or lesions,oropharynx pink & moist. Neck:  Supple; no masses or thyromegaly. Lungs:  Respirations even and unlabored.  Clear throughout to auscultation.   No wheezes, crackles, or rhonchi. No acute distress. Heart:  Regular rate and rhythm; no murmurs, clicks, rubs, or gallops. Abdomen:  Normal bowel sounds.  No bruits.  Soft, non-tender and non-distended without masses, hepatosplenomegaly or hernias noted.  No guarding or rebound tenderness.    Extremities:  No clubbing or edema.  No cyanosis. Neurologic:  Alert and oriented x3;  grossly normal neurologically. Psych:  Alert and cooperative. Normal mood and affect. CBC Latest Ref Rng & Units 03/20/2016 03/19/2016 03/18/2016  WBC 3.6 - 11.0 K/uL 9.7 11.7(H) -  Hemoglobin 12.0 - 16.0 g/dL 11.5(L) 11.9(L) 12.1  Hematocrit 35.0 - 47.0 % 33.4(L) 35.0 34.9(L)  Platelets 150 - 440 K/uL 211 222 -    Imaging Studies: Ct Head Wo Contrast  Result Date: 03/18/2016 CLINICAL DATA:  Syncopal episode at work. Diabetic. Neck pain after the fall. History of stroke in July 2017. History of hypertension. EXAM: CT HEAD WITHOUT CONTRAST CT CERVICAL SPINE WITHOUT CONTRAST TECHNIQUE: Multidetector CT imaging of the head and cervical spine was performed following the standard protocol without intravenous contrast. Multiplanar CT image reconstructions of the cervical spine were also generated. COMPARISON:  MRI brain 10/03/2015.  CT head 10/03/2015 FINDINGS: CT HEAD FINDINGS Brain: No evidence of acute infarction, hemorrhage, hydrocephalus, extra-axial collection or mass lesion/mass effect. 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 Vascular: No hyperdense vessel or unexpected calcification. Skull: Normal. Negative for fracture or focal lesion. Sinuses/Orbits: No acute finding. Other: None. CT CERVICAL SPINE FINDINGS Alignment: There is reversal of the usual cervical lordosis without anterior subluxation. Normal alignment of the facet joints. Is likely due  to patient positioning or degenerative change but ligamentous injury or muscle spasm could also have this appearance and are not excluded. C1-2 articulation appears intact. Skull base and vertebrae: No acute fracture. No primary bone lesion or focal pathologic process. Soft tissues and spinal canal: No prevertebral fluid or swelling. No visible canal hematoma. Disc levels: Degenerative changes with narrowed interspaces and endplate hypertrophic changes most prominent at C4-5, C5-6, and C6-7 levels. Upper chest: Negative. Other: None. IMPRESSION: No acute intracranial abnormalities. Nonspecific reversal of the usual cervical lordosis. Degenerative changes. No acute displaced cervical spine fractures. Electronically Signed   By: Lucienne Capers M.D.   On: 03/18/2016 03:39   Ct Cervical Spine Wo Contrast  Result Date: 03/18/2016 CLINICAL DATA:  Syncopal episode at work. Diabetic. Neck pain after the fall. History of stroke  in July 2017. History of hypertension. EXAM: CT HEAD WITHOUT CONTRAST CT CERVICAL SPINE WITHOUT CONTRAST TECHNIQUE: Multidetector CT imaging of the head and cervical spine was performed following the standard protocol without intravenous contrast. Multiplanar CT image reconstructions of the cervical spine were also generated. COMPARISON:  MRI brain 10/03/2015.  CT head 10/03/2015 FINDINGS: CT HEAD FINDINGS Brain: No evidence of acute infarction, hemorrhage, hydrocephalus, extra-axial collection or mass lesion/mass effect. 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 Vascular: No hyperdense vessel or unexpected calcification. Skull: Normal. Negative for fracture or focal lesion. Sinuses/Orbits: No acute finding. Other: None. CT CERVICAL SPINE FINDINGS Alignment: There is reversal of the usual cervical lordosis without anterior subluxation. Normal alignment of the facet joints. Is likely due to patient positioning or degenerative change but ligamentous injury or muscle spasm could also have this appearance  and are not excluded. C1-2 articulation appears intact. Skull base and vertebrae: No acute fracture. No primary bone lesion or focal pathologic process. Soft tissues and spinal canal: No prevertebral fluid or swelling. No visible canal hematoma. Disc levels: Degenerative changes with narrowed interspaces and endplate hypertrophic changes most prominent at C4-5, C5-6, and C6-7 levels. Upper chest: Negative. Other: None. IMPRESSION: No acute intracranial abnormalities. Nonspecific reversal of the usual cervical lordosis. Degenerative changes. No acute displaced cervical spine fractures. Electronically Signed   By: Lucienne Capers M.D.   On: 03/18/2016 03:39    Assessment and Plan:   KARMON FOLK a 51 y.o.y/o Larsen and she is here for follow up for  normocytic anemia( had been on iron when she had the lab work).  No overt blood loss. She has been on plavix , asprin and iron since September 2017. She has never had GI evaluation and she would need EGD+ colonoscopy . She would need 5 days off plavix after  neurology clearance to do so if possible. If not possible we will be able to still proceed with endoscopy but not be able to resect any large polyps if seen. I am comfortable with her to stay on the asprin . If endoscopy is negative will then need small bowel capsule study. I will also check her urine to ensure no losses as well as her celiac serology.    Follow up in 8-10 weeks  Dr Jonathon Bellows MD

## 2016-04-01 NOTE — Patient Instructions (Addendum)
Neurology Clearance to Hold Plavix Dr Melrose Nakayama @ [facility]. EGD/COLONOSCOPY @ [facility] Dr Vicente Males 04/18/16.

## 2016-04-01 NOTE — Addendum Note (Signed)
Addended by: Clarnce Flock E on: 04/01/2016 02:34 PM   Modules accepted: Level of Service

## 2016-04-02 ENCOUNTER — Ambulatory Visit
Admission: RE | Admit: 2016-04-02 | Discharge: 2016-04-02 | Disposition: A | Discharge status: Home / Self Care | Payer: Managed Care, Other (non HMO) | Source: Ambulatory Visit | Attending: Internal Medicine | Admitting: Internal Medicine

## 2016-04-02 DIAGNOSIS — K760 Fatty (change of) liver, not elsewhere classified: Secondary | ICD-10-CM | POA: Diagnosis not present

## 2016-04-02 DIAGNOSIS — R945 Abnormal results of liver function studies: Secondary | ICD-10-CM

## 2016-04-04 ENCOUNTER — Encounter: Payer: Self-pay | Admitting: Oncology

## 2016-04-04 ENCOUNTER — Inpatient Hospital Stay: Payer: Managed Care, Other (non HMO) | Attending: Oncology | Admitting: Oncology

## 2016-04-04 ENCOUNTER — Inpatient Hospital Stay: Payer: Managed Care, Other (non HMO)

## 2016-04-04 VITALS — BP 152/90 | HR 74 | Temp 98.0°F | Ht 60.0 in | Wt 146.2 lb

## 2016-04-04 DIAGNOSIS — Z79899 Other long term (current) drug therapy: Secondary | ICD-10-CM | POA: Diagnosis not present

## 2016-04-04 DIAGNOSIS — E119 Type 2 diabetes mellitus without complications: Secondary | ICD-10-CM | POA: Diagnosis not present

## 2016-04-04 DIAGNOSIS — R5383 Other fatigue: Secondary | ICD-10-CM | POA: Diagnosis not present

## 2016-04-04 DIAGNOSIS — D649 Anemia, unspecified: Secondary | ICD-10-CM

## 2016-04-04 DIAGNOSIS — I10 Essential (primary) hypertension: Secondary | ICD-10-CM | POA: Diagnosis not present

## 2016-04-04 DIAGNOSIS — Z8673 Personal history of transient ischemic attack (TIA), and cerebral infarction without residual deficits: Secondary | ICD-10-CM

## 2016-04-04 DIAGNOSIS — Z7982 Long term (current) use of aspirin: Secondary | ICD-10-CM

## 2016-04-04 DIAGNOSIS — F17210 Nicotine dependence, cigarettes, uncomplicated: Secondary | ICD-10-CM

## 2016-04-04 LAB — CBC WITH DIFFERENTIAL/PLATELET
BASOS ABS: 0.1 10*3/uL (ref 0–0.1)
BASOS PCT: 1 %
EOS ABS: 0.4 10*3/uL (ref 0–0.7)
EOS PCT: 4 %
HCT: 36 % (ref 35.0–47.0)
HEMOGLOBIN: 12.3 g/dL (ref 12.0–16.0)
Lymphocytes Relative: 38 %
Lymphs Abs: 3.9 10*3/uL — ABNORMAL HIGH (ref 1.0–3.6)
MCH: 30 pg (ref 26.0–34.0)
MCHC: 34.1 g/dL (ref 32.0–36.0)
MCV: 87.9 fL (ref 80.0–100.0)
Monocytes Absolute: 0.7 10*3/uL (ref 0.2–0.9)
Monocytes Relative: 7 %
NEUTROS PCT: 50 %
Neutro Abs: 5.3 10*3/uL (ref 1.4–6.5)
PLATELETS: 276 10*3/uL (ref 150–440)
RBC: 4.1 MIL/uL (ref 3.80–5.20)
RDW: 13.2 % (ref 11.5–14.5)
WBC: 10.4 10*3/uL (ref 3.6–11.0)

## 2016-04-04 NOTE — Progress Notes (Signed)
Hematology/Oncology Consult note [facility]  [phone] [phone]  Patient Care Team: Delorise Jackson, MD as PCP - General (Internal Medicine)   Name of the patient: Lori Larsen  [id number]  Jan 20, 1966   Date of visit: 04/04/16  Diagnosis- normocytic anemia of unclear etiology.   Chief complaint/ Reason for visit- routine f/u of anemia  Heme/Onc history: patient is a 51 year old female with a past medical history significant for hypertension and diabetes presented to the ER this morning after she passed out at work on 03/18/16. Patient reports symptoms of fatigue and feeling tired over the last couple of weeks. Patient denied any history of bright red blood in stool or dark tarry stools denies any gum bleeds, nosebleeds and she has not thrown up or coughed up blood. No h/o vaginal bleeding Patient had a stool guaiac in the ER which was negative. CBC done in the ER showed white count of 6.3, H&H of 6.9/20.1 and a platelet count of 154. On looking at her prior CBCs from 2016 she was known to have some anemia with a hemoglobin between 8.6-10.6. Her prior CBC from 10/03/2015 revealed H&H of 10.9/32.9. Ferritin done today was 105 with normal iron studies. Folate was normal at 22.4. Reticulocyte count was 1% (inappropriately low for the degree of anemia). CMP was normal except for a blood sugar of 206 with a normal total protein and normal total bilirubin. CT head and CT cervical spine did not reveal any acute pathology. She has never had any EGD or colonoscopy. Denies any family history of colon cancer  Patient was seen by GI who recommended a complete workup including EGD and colonoscopy plus minus capsule endoscopy. However they are awaiting clearance from urology to get her off Plavix given her history of CVA  The results of blood work from her anemia workup or as follows: Ferritin was normal at 105. Iron studies were normal. LDH was  normal at 127. Haptoglobin was normal and Coombs test was negative. Both And lambda free light chains were elevated but the ratio was normal at 1.42. SPEP did not reveal any monoclonal protein. B12 and folate levels were within normal limits. 24 hour urine protein revealed free kappa And lambda light chains and elevated Lambda ratio of 20.5. Immunofixation pattern in the protein was normal. Reticulocyte count was inappropriately low for the degree of anemia at 1%  Review of peripheral smear revealed adequate platelets. RBCs are normochromic and normocytic. Leukocyte count and differential within normal limits. And no abnormal or immature cells identified.  Interval history- doing well. She is on oral iron BID. EGD colonoscopy scheduled next month. Denies any blood loss  Review of systems- Review of Systems  Constitutional: Negative for chills, fever, malaise/fatigue and weight loss.  HENT: Negative for congestion, ear discharge and nosebleeds.   Eyes: Negative for blurred vision.  Respiratory: Negative for cough, hemoptysis, sputum production, shortness of breath and wheezing.   Cardiovascular: Negative for chest pain, palpitations, orthopnea and claudication.  Gastrointestinal: Negative for abdominal pain, blood in stool, constipation, diarrhea, heartburn, melena, nausea and vomiting.  Genitourinary: Negative for dysuria, flank pain, frequency, hematuria and urgency.  Musculoskeletal: Negative for back pain, joint pain and myalgias.  Skin: Negative for rash.  Neurological: Negative for dizziness, tingling, focal weakness, seizures, weakness and headaches.  Endo/Heme/Allergies: Does not bruise/bleed easily.  Psychiatric/Behavioral: Negative for depression and suicidal ideas. The patient does not have insomnia.      Current treatment- oral iron  No Known Allergies   Past Medical History:  Diagnosis Date  . Diabetes mellitus without complication ([place])   . Hypertension      Past  Surgical History:  Procedure Laterality Date  . CESAREAN SECTION      Social History   Social History  . Marital status: Single    Spouse name: N/A  . Number of children: N/A  . Years of education: N/A   Occupational History  . works at [employer]  . Smoking status: Current Some Day Smoker    Packs/day: 0.50    Years: 20.00    Types: Cigarettes  . Smokeless tobacco: Never Used  . Alcohol use Yes     Comment:  occasionally drinks beer  . Drug use: No  . Sexual activity: Yes    Birth control/ protection: Post-menopausal   Other Topics Concern  . Not on file   Social History Narrative  . No narrative on file    Family History  Problem Relation Age of Onset  . Hypertension Mother   . Hypertension Father   . Breast cancer Neg Hx      Current Outpatient Prescriptions:  .  amLODipine (NORVASC) 2.5 MG tablet, Take 2.5 mg by mouth daily., Disp: , Rfl:  .  aspirin 325 MG tablet, Take 1 tablet (325 mg total) by mouth daily., Disp: 120 tablet, Rfl: 0 .  atorvastatin (LIPITOR) 40 MG tablet, Take 1 tablet (40 mg total) by mouth daily at 6 PM., Disp: 30 tablet, Rfl: 0 .  clopidogrel (PLAVIX) 75 MG tablet, Take 75 mg by mouth daily., Disp: , Rfl:  .  ferrous sulfate 325 (65 FE) MG tablet, Take 1 tablet by mouth 2 (two) times daily., Disp: , Rfl:  .  gabapentin (NEURONTIN) 100 MG capsule, Take 1 capsule by mouth 2 (two) times daily. , Disp: , Rfl:  .  glipiZIDE (GLUCOTROL) 5 MG tablet, Take 1 tablet (5 mg total) by mouth daily., Disp: 30 tablet, Rfl: 11 .  hydrochlorothiazide (HYDRODIURIL) 25 MG tablet, Take 1 tablet by mouth daily., Disp: , Rfl:  .  lisinopril (PRINIVIL,ZESTRIL) 40 MG tablet, Take 1 tablet by mouth every morning., Disp: , Rfl:  .  meclizine (ANTIVERT) 25 MG tablet, Take 1 tablet (25 mg total) by mouth 3 (three) times daily as needed for dizziness or nausea., Disp: 30 tablet, Rfl: 1 .  nicotine (NICODERM CQ - DOSED IN MG/24  HOURS) 21 mg/24hr patch, Place 1 patch (21 mg total) onto the skin daily., Disp: 28 patch, Rfl: 0 .  NOVOFINE PLUS 32G X 4 MM MISC, , Disp: , Rfl:  .  pantoprazole (PROTONIX) 40 MG tablet, Take 1 tablet (40 mg total) by mouth daily., Disp: 30 tablet, Rfl: 0 .  TRESIBA FLEXTOUCH 100 UNIT/ML SOPN FlexTouch Pen, Inject 24 Units into the skin every morning. , Disp: , Rfl: 2  Physical exam:  Vitals:   04/04/16 1329  BP: (!) 162/101  Pulse: 86  Temp: 98 F (36.7 C)  TempSrc: Oral  SpO2: 100%  Weight: 146 lb 2.6 oz (66.3 kg)  Height: 5' (1.524 m)   Physical Exam  Constitutional: She is oriented to person, place, and time and well-developed, well-nourished, and in no distress.  HENT:  Head: Normocephalic and atraumatic.  Eyes: EOM are normal. Pupils are equal, round, and reactive to light.  Neck: Normal range of motion.  Cardiovascular: Normal rate, regular rhythm and normal heart sounds.  Pulmonary/Chest: Effort normal and breath sounds normal.  Abdominal: Soft. Bowel sounds are normal.  Neurological: She is alert and oriented to person, place, and time.  Skin: Skin is warm and dry.     CMP Latest Ref Rng & Units 03/19/2016  Glucose 65 - 99 mg/dL 236(H)  BUN 6 - 20 mg/dL 19  Creatinine 0.44 - 1.00 mg/dL 0.77  Sodium 135 - 145 mmol/L 136  Potassium 3.5 - 5.1 mmol/L 3.9  Chloride 101 - 111 mmol/L 107  CO2 22 - 32 mmol/L 23  Calcium 8.9 - 10.3 mg/dL 8.6(L)  Total Protein 6.5 - 8.1 g/dL -  Total Bilirubin 0.3 - 1.2 mg/dL -  Alkaline Phos 38 - 126 U/L -  AST 15 - 41 U/L -  ALT 14 - 54 U/L -   CBC Latest Ref Rng & Units 03/20/2016  WBC 3.6 - 11.0 K/uL 9.7  Hemoglobin 12.0 - 16.0 g/dL 11.5(L)  Hematocrit 35.0 - 47.0 % 33.4(L)  Platelets 150 - 440 K/uL 211    No images are attached to the encounter.  Ct Head Wo Contrast  Result Date: 03/18/2016 CLINICAL DATA:  Syncopal episode at work. Diabetic. Neck pain after the fall. History of stroke in July 2017. History of hypertension.  EXAM: CT HEAD WITHOUT CONTRAST CT CERVICAL SPINE WITHOUT CONTRAST TECHNIQUE: Multidetector CT imaging of the head and cervical spine was performed following the standard protocol without intravenous contrast. Multiplanar CT image reconstructions of the cervical spine were also generated. COMPARISON:  MRI brain 10/03/2015.  CT head 10/03/2015 FINDINGS: CT HEAD FINDINGS Brain: No evidence of acute infarction, hemorrhage, hydrocephalus, extra-axial collection or mass lesion/mass effect. 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 Vascular: No hyperdense vessel or unexpected calcification. Skull: Normal. Negative for fracture or focal lesion. Sinuses/Orbits: No acute finding. Other: None. CT CERVICAL SPINE FINDINGS Alignment: There is reversal of the usual cervical lordosis without anterior subluxation. Normal alignment of the facet joints. Is likely due to patient positioning or degenerative change but ligamentous injury or muscle spasm could also have this appearance and are not excluded. C1-2 articulation appears intact. Skull base and vertebrae: No acute fracture. No primary bone lesion or focal pathologic process. Soft tissues and spinal canal: No prevertebral fluid or swelling. No visible canal hematoma. Disc levels: Degenerative changes with narrowed interspaces and endplate hypertrophic changes most prominent at C4-5, C5-6, and C6-7 levels. Upper chest: Negative. Other: None. IMPRESSION: No acute intracranial abnormalities. Nonspecific reversal of the usual cervical lordosis. Degenerative changes. No acute displaced cervical spine fractures. Electronically Signed   By: Lucienne Capers M.D.   On: 03/18/2016 03:39   Ct Cervical Spine Wo Contrast  Result Date: 03/18/2016 CLINICAL DATA:  Syncopal episode at work. Diabetic. Neck pain after the fall. History of stroke in July 2017. History of hypertension. EXAM: CT HEAD WITHOUT CONTRAST CT CERVICAL SPINE WITHOUT CONTRAST TECHNIQUE: Multidetector CT imaging of the head and  cervical spine was performed following the standard protocol without intravenous contrast. Multiplanar CT image reconstructions of the cervical spine were also generated. COMPARISON:  MRI brain 10/03/2015.  CT head 10/03/2015 FINDINGS: CT HEAD FINDINGS Brain: No evidence of acute infarction, hemorrhage, hydrocephalus, extra-axial collection or mass lesion/mass effect. 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 Vascular: No hyperdense vessel or unexpected calcification. Skull: Normal. Negative for fracture or focal lesion. Sinuses/Orbits: No acute finding. Other: None. CT CERVICAL SPINE FINDINGS Alignment: There is reversal of the usual cervical lordosis without anterior subluxation. Normal alignment of the facet joints. Is likely due to patient positioning  or degenerative change but ligamentous injury or muscle spasm could also have this appearance and are not excluded. C1-2 articulation appears intact. Skull base and vertebrae: No acute fracture. No primary bone lesion or focal pathologic process. Soft tissues and spinal canal: No prevertebral fluid or swelling. No visible canal hematoma. Disc levels: Degenerative changes with narrowed interspaces and endplate hypertrophic changes most prominent at C4-5, C5-6, and C6-7 levels. Upper chest: Negative. Other: None. IMPRESSION: No acute intracranial abnormalities. Nonspecific reversal of the usual cervical lordosis. Degenerative changes. No acute displaced cervical spine fractures. Electronically Signed   By: Lucienne Capers M.D.   On: 03/18/2016 03:39   US Abdomen Complete  Result Date: 04/02/2016 CLINICAL DATA:  Abnormal LFTs EXAM: ABDOMEN ULTRASOUND COMPLETE COMPARISON:  None. FINDINGS: Gallbladder: Non mobile echogenicities are noted within the gallbladder likely representing small polyps or foci the cluster although cysts. No gallstones are seen. No wall thickening is noted. Common bile duct: Diameter: 2.6 mm. Liver: Diffuse increased echogenicity is noted consistent  with fatty infiltration. An area of focal fatty sparing is noted adjacent to the gallbladder fossa. IVC: No abnormality visualized. Pancreas: Visualized portion unremarkable. Spleen: Size and appearance within normal limits. Right Kidney: Length: 9.5 cm. Echogenicity within normal limits. No mass or hydronephrosis visualized. Left Kidney: Length: 9.3 cm. Echogenicity within normal limits. No mass or hydronephrosis visualized. Abdominal aorta: No aneurysm visualized. Other findings: None. IMPRESSION: Fatty infiltration of the liver. Small gallbladder polyps versus cholesterolosis of the gallbladder wall. No definitive cholelithiasis or biliary obstruction is seen. Electronically Signed   By: Inez Catalina M.D.   On: 04/02/2016 11:05     Assessment and plan- Patient is a 51 y.o. female with a h/o normocytic anemia of unclear etiology  1. Normocytic anemia- It is not clear as to why patient had a sudden drop in her hb to 6.9 2 weeks ago in the absence of gross bleeding. Iron studies were normal and other work up for anemia was unrevealing. She is currently on oral iron and holding up her hemoglobin well. She has EGD/ colonscopy scheduled next month. I will see her back in 2 months with cbc and iron studies. She did have free light chains in her urine. No significant proteinuria and normal kidney functions. Will repeat it during next visit  2. Hypertension- patient states her BP is well controlled at home and she was nervous coming here   Visit Diagnosis 1. Normocytic anemia      Dr. Randa Evens, MD, MPH [facility] Pager- [phone] 04/04/2016 12:17 PM

## 2016-04-04 NOTE — Progress Notes (Signed)
Patient is here for post hosp. Follow up. She denies any pain or clinical issues. Her BP is elevated today. She states it is usually normal she is compliant with her BP medication. She states she will monitor it at home for a week and notify her PCP if it is still elevated.

## 2016-04-07 NOTE — Progress Notes (Signed)
Recheck b/p 152/90

## 2016-04-08 ENCOUNTER — Other Ambulatory Visit: Payer: Self-pay

## 2016-04-17 ENCOUNTER — Encounter: Payer: Self-pay | Admitting: *Deleted

## 2016-04-18 ENCOUNTER — Encounter
Admission: RE | Disposition: A | Discharge status: Home / Self Care | Payer: Self-pay | Source: Ambulatory Visit | Attending: Gastroenterology

## 2016-04-18 ENCOUNTER — Ambulatory Visit: Payer: Managed Care, Other (non HMO) | Admitting: Anesthesiology

## 2016-04-18 ENCOUNTER — Ambulatory Visit
Admission: RE | Admit: 2016-04-18 | Discharge: 2016-04-18 | Disposition: A | Discharge status: Home / Self Care | Payer: Managed Care, Other (non HMO) | Source: Ambulatory Visit | Attending: Gastroenterology | Admitting: Gastroenterology

## 2016-04-18 ENCOUNTER — Encounter: Payer: Self-pay | Admitting: *Deleted

## 2016-04-18 DIAGNOSIS — K319 Disease of stomach and duodenum, unspecified: Secondary | ICD-10-CM

## 2016-04-18 DIAGNOSIS — D509 Iron deficiency anemia, unspecified: Secondary | ICD-10-CM | POA: Diagnosis present

## 2016-04-18 DIAGNOSIS — E119 Type 2 diabetes mellitus without complications: Secondary | ICD-10-CM | POA: Insufficient documentation

## 2016-04-18 DIAGNOSIS — Z79899 Other long term (current) drug therapy: Secondary | ICD-10-CM | POA: Diagnosis not present

## 2016-04-18 DIAGNOSIS — Z7982 Long term (current) use of aspirin: Secondary | ICD-10-CM | POA: Diagnosis not present

## 2016-04-18 DIAGNOSIS — K635 Polyp of colon: Secondary | ICD-10-CM | POA: Diagnosis not present

## 2016-04-18 DIAGNOSIS — Z8673 Personal history of transient ischemic attack (TIA), and cerebral infarction without residual deficits: Secondary | ICD-10-CM | POA: Diagnosis not present

## 2016-04-18 DIAGNOSIS — Z7984 Long term (current) use of oral hypoglycemic drugs: Secondary | ICD-10-CM | POA: Insufficient documentation

## 2016-04-18 DIAGNOSIS — I10 Essential (primary) hypertension: Secondary | ICD-10-CM | POA: Insufficient documentation

## 2016-04-18 DIAGNOSIS — D123 Benign neoplasm of transverse colon: Secondary | ICD-10-CM

## 2016-04-18 DIAGNOSIS — F17210 Nicotine dependence, cigarettes, uncomplicated: Secondary | ICD-10-CM | POA: Diagnosis not present

## 2016-04-18 DIAGNOSIS — D125 Benign neoplasm of sigmoid colon: Secondary | ICD-10-CM

## 2016-04-18 DIAGNOSIS — K640 First degree hemorrhoids: Secondary | ICD-10-CM | POA: Diagnosis not present

## 2016-04-18 DIAGNOSIS — K3189 Other diseases of stomach and duodenum: Secondary | ICD-10-CM | POA: Diagnosis not present

## 2016-04-18 DIAGNOSIS — Z7902 Long term (current) use of antithrombotics/antiplatelets: Secondary | ICD-10-CM | POA: Insufficient documentation

## 2016-04-18 DIAGNOSIS — D374 Neoplasm of uncertain behavior of colon: Secondary | ICD-10-CM | POA: Diagnosis not present

## 2016-04-18 HISTORY — DX: Cerebral infarction, unspecified: I63.9

## 2016-04-18 HISTORY — PX: COLONOSCOPY WITH PROPOFOL: SHX5780

## 2016-04-18 HISTORY — PX: ESOPHAGOGASTRODUODENOSCOPY (EGD) WITH PROPOFOL: SHX5813

## 2016-04-18 LAB — GLUCOSE, CAPILLARY: Glucose-Capillary: 95 mg/dL (ref 65–99)

## 2016-04-18 SURGERY — COLONOSCOPY WITH PROPOFOL
Anesthesia: General

## 2016-04-18 MED ORDER — MIDAZOLAM HCL 5 MG/5ML IJ SOLN
INTRAMUSCULAR | Status: DC | PRN
  Administered 2016-04-18: 1 mg via INTRAVENOUS

## 2016-04-18 MED ORDER — FENTANYL CITRATE (PF) 100 MCG/2ML IJ SOLN
INTRAMUSCULAR | Status: AC
  Filled 2016-04-18: qty 2

## 2016-04-18 MED ORDER — LIDOCAINE HCL (PF) 2 % IJ SOLN
INTRAMUSCULAR | Status: AC
  Filled 2016-04-18: qty 2

## 2016-04-18 MED ORDER — PROPOFOL 500 MG/50ML IV EMUL
INTRAVENOUS | Status: DC | PRN
Start: 2016-04-18 — End: 2016-04-18
  Administered 2016-04-18: 120 ug/kg/min via INTRAVENOUS

## 2016-04-18 MED ORDER — PROPOFOL 10 MG/ML IV BOLUS
INTRAVENOUS | Status: AC
  Filled 2016-04-18: qty 20

## 2016-04-18 MED ORDER — MIDAZOLAM HCL 2 MG/2ML IJ SOLN
INTRAMUSCULAR | Status: AC
  Filled 2016-04-18: qty 2

## 2016-04-18 MED ORDER — SODIUM CHLORIDE 0.9 % IV SOLN
INTRAVENOUS | Status: DC
  Administered 2016-04-18: 1000 mL via INTRAVENOUS

## 2016-04-18 MED ORDER — PROPOFOL 500 MG/50ML IV EMUL
INTRAVENOUS | Status: AC
  Filled 2016-04-18: qty 50

## 2016-04-18 MED ORDER — PROPOFOL 10 MG/ML IV BOLUS
INTRAVENOUS | Status: DC | PRN
  Administered 2016-04-18: 33.6 mg via INTRAVENOUS

## 2016-04-18 MED ORDER — FENTANYL CITRATE (PF) 100 MCG/2ML IJ SOLN
INTRAMUSCULAR | Status: DC | PRN
  Administered 2016-04-18: 50 ug via INTRAVENOUS

## 2016-04-18 NOTE — Op Note (Signed)
[facility] Gastroenterology Patient Name: Lori Larsen Procedure Date: 04/18/2016 2:07 PM MRN: [id number] Account #: [id number] Date of Birth: 1965-04-25 Admit Type: Outpatient Age: 51 Room: [facility] ENDO ROOM 1 Gender: Female Note Status: Finalized Procedure:            Colonoscopy Indications:          Iron deficiency anemia Providers:            Jonathon Bellows MD, MD Referring MD:         Olivia Mackie Mclean-Scocuzza (Referring MD) Medicines:            Monitored Anesthesia Care Complications:        No immediate complications. Procedure:            Pre-Anesthesia Assessment:                       - Prior to the procedure, a History and Physical was                        performed, and patient medications, allergies and                        sensitivities were reviewed. The patient's tolerance of                        previous anesthesia was reviewed.                       - The risks and benefits of the procedure and the                        sedation options and risks were discussed with the                        patient. All questions were answered and informed                        consent was obtained.                       - The risks and benefits of the procedure and the                        sedation options and risks were discussed with the                        patient. All questions were answered and informed                        consent was obtained.                       - After reviewing the risks and benefits, the patient                        was deemed in satisfactory condition to undergo the                        procedure.                       - ASA  Grade Assessment: III - A patient with severe                        systemic disease.                       After obtaining informed consent, the colonoscope was                        passed under direct vision. Throughout the procedure,                        the patient's blood pressure,  pulse, and oxygen                        saturations were monitored continuously. The                        Colonoscope was introduced through the anus and                        advanced to the the cecum, identified by the                        appendiceal orifice, IC valve and transillumination.                        The colonoscopy was performed without difficulty. The                        patient tolerated the procedure well. The quality of                        the bowel preparation was good. Findings:      The perianal and digital rectal examinations were normal.      A 8 mm polyp was found in the sigmoid colon. The polyp was semi-sessile.       The polyp was removed with a hot snare. Resection and retrieval were       complete.      A 7 mm polyp was found in the hepatic flexure. The polyp was sessile.       The polyp was removed with a hot snare. Resection was complete, but the       polyp tissue was not retrieved.      Non-bleeding internal hemorrhoids were found during retroflexion. The       hemorrhoids were medium-sized and Grade I (internal hemorrhoids that do       not prolapse).      The exam was otherwise without abnormality on direct and retroflexion       views. Impression:           - One 8 mm polyp in the sigmoid colon, removed with a                        hot snare. Resected and retrieved.                       - One 7 mm polyp at the hepatic flexure, removed with a  hot snare. Complete resection. Polyp tissue not                        retrieved.                       - Non-bleeding internal hemorrhoids.                       - The examination was otherwise normal on direct and                        retroflexion views. Recommendation:       - Discharge patient to home.                       - Resume previous diet.                       - Continue present medications.                       - Resume Plavix (clopidogrel) at prior dose  today.                       - Await pathology results.                       - Repeat colonoscopy in 5 years for surveillance.                       - Return to GI office as previously scheduled.                       - To visualize the small bowel, perform video capsule                        endoscopy in 2 weeks. Procedure Code(s):    --- Professional ---                       [phone], Colonoscopy, flexible; with removal of tumor(s),                        polyp(s), or other lesion(s) by snare technique Diagnosis Code(s):    --- Professional ---                       D12.5, Benign neoplasm of sigmoid colon                       D12.3, Benign neoplasm of transverse colon (hepatic                        flexure or splenic flexure)                       K64.0, First degree hemorrhoids                       D50.9, Iron deficiency anemia, unspecified CPT copyright 2016 American Medical Association. All rights reserved. The codes documented in this report are preliminary and upon coder review may  be revised to meet current compliance requirements. Jonathon Bellows, MD Jonathon Bellows MD,  MD 04/18/2016 2:47:52 PM This report has been signed electronically. Number of Addenda: 0 Note Initiated On: 04/18/2016 2:07 PM Scope Withdrawal Time: 0 hours 10 minutes 24 seconds  Total Procedure Duration: 0 hours 16 minutes 55 seconds       [facility]

## 2016-04-18 NOTE — Op Note (Signed)
[facility] Gastroenterology Patient Name: Lori Larsen Procedure Date: 04/18/2016 2:10 PM MRN: [id number] Account #: [id number] Date of Birth: 1965-09-21 Admit Type: Outpatient Age: 51 Room: [facility] ENDO ROOM 1 Gender: Female Note Status: Finalized Procedure:            Upper GI endoscopy Indications:          Iron deficiency anemia Providers:            Jonathon Bellows MD, MD Referring MD:         Olivia Mackie Mclean-Scocuzza (Referring MD) Medicines:            Monitored Anesthesia Care Complications:        No immediate complications. Procedure:            Pre-Anesthesia Assessment:                       - Prior to the procedure, a History and Physical was                        performed, and patient medications, allergies and                        sensitivities were reviewed. The patient's tolerance of                        previous anesthesia was reviewed.                       - The risks and benefits of the procedure and the                        sedation options and risks were discussed with the                        patient. All questions were answered and informed                        consent was obtained.                       - The risks and benefits of the procedure and the                        sedation options and risks were discussed with the                        patient. All questions were answered and informed                        consent was obtained.                       - ASA Grade Assessment: III - A patient with severe                        systemic disease.                       After obtaining informed consent, the endoscope was  passed under direct vision. Throughout the procedure,                        the patient's blood pressure, pulse, and oxygen                        saturations were monitored continuously. The Endoscope                        was introduced through the mouth, and advanced to the                third part of duodenum. The upper GI endoscopy was                        accomplished with ease. The patient tolerated the                        procedure well. Findings:      The examined duodenum was normal. Biopsies for histology were taken with       a cold forceps for evaluation of celiac disease.      The esophagus was normal.      A medium-sized, submucosal mass with no bleeding and no stigmata of       recent bleeding was found at the incisura. Impression:           - Normal examined duodenum. Biopsied.                       - Normal esophagus.                       - Rule out malignancy, gastric tumor at the incisura. Recommendation:       - Await pathology results.                       - Discharge patient to home (with escort).                       - Resume previous diet.                       - Resume Plavix (clopidogrel) at prior dose today.                       - Perform an upper endoscopic ultrasound (UEUS) in 4                        weeks.                       - To evaluate gastric submucosal lesion Procedure Code(s):    --- Professional ---                       [phone], Esophagogastroduodenoscopy, flexible, transoral;                        with biopsy, single or multiple Diagnosis Code(s):    --- Professional ---                       D49.0, Neoplasm of unspecified behavior of digestive  system                       D50.9, Iron deficiency anemia, unspecified CPT copyright 2016 American Medical Association. All rights reserved. The codes documented in this report are preliminary and upon coder review may  be revised to meet current compliance requirements. Jonathon Bellows, MD Jonathon Bellows MD, MD 04/18/2016 2:25:47 PM This report has been signed electronically. Number of Addenda: 0 Note Initiated On: 04/18/2016 2:10 PM      [facility]

## 2016-04-18 NOTE — Transfer of Care (Signed)
Immediate Anesthesia Transfer of Care Note  Patient: Lori Larsen  Procedure(s) Performed: Procedure(s): COLONOSCOPY WITH PROPOFOL (N/A) ESOPHAGOGASTRODUODENOSCOPY (EGD) WITH PROPOFOL (N/A)  Patient Location: PACU and Endoscopy Unit  Anesthesia Type:General  Level of Consciousness: sedated  Airway & Oxygen Therapy: Patient Spontanous Breathing and Patient connected to nasal cannula oxygen  Post-op Assessment: Report given to RN  Post vital signs: stable  Last Vitals:  Vitals:   04/18/16 1501 04/18/16 1511  BP: 109/75 119/83  Pulse: 74 74  Resp: 18 11  Temp:      Last Pain:  Vitals:   04/18/16 1451  TempSrc: Tympanic         Complications: No apparent anesthesia complications

## 2016-04-18 NOTE — H&P (Signed)
Jonathon Bellows MD [address], [place], [place] [address] Phone: [phone] Fax : [phone]  Primary Care Physician:  Nino Glow McLean-Scocuzza, MD Primary Gastroenterologist:  Dr. Jonathon Bellows   Pre-Procedure History & Physical: HPI:  Lori Larsen is a 51 y.o. female is here for an endoscopy and colonoscopy.   Past Medical History:  Diagnosis Date  . Diabetes mellitus without complication ([place])   . Hypertension     Past Surgical History:  Procedure Laterality Date  . CESAREAN SECTION      Prior to Admission medications   Medication Sig Start Date End Date Taking? Authorizing Provider  amLODipine (NORVASC) 2.5 MG tablet Take 2.5 mg by mouth daily.   Yes Historical Provider, MD  aspirin 325 MG tablet Take 1 tablet (325 mg total) by mouth daily. 10/04/15  Yes Bettey Costa, MD  atorvastatin (LIPITOR) 40 MG tablet Take 1 tablet (40 mg total) by mouth daily at 6 PM. 10/04/15  Yes Sital Mody, MD  clopidogrel (PLAVIX) 75 MG tablet Take 75 mg by mouth daily.   Yes Historical Provider, MD  ferrous sulfate 325 (65 FE) MG tablet Take 1 tablet by mouth 2 (two) times daily. 02/04/16  Yes Historical Provider, MD  gabapentin (NEURONTIN) 100 MG capsule Take 1 capsule by mouth 2 (two) times daily.  03/18/16  Yes Historical Provider, MD  glipiZIDE (GLUCOTROL) 5 MG tablet Take 1 tablet (5 mg total) by mouth daily. 09/27/15 09/26/16 Yes Earleen Newport, MD  hydrochlorothiazide (HYDRODIURIL) 25 MG tablet Take 1 tablet by mouth daily. 12/25/15  Yes Historical Provider, MD  JANUVIA 50 MG tablet  03/25/16  Yes Historical Provider, MD  lisinopril (PRINIVIL,ZESTRIL) 40 MG tablet Take 1 tablet by mouth every morning. 02/04/16  Yes Historical Provider, MD  meclizine (ANTIVERT) 25 MG tablet Take 1 tablet (25 mg total) by mouth 3 (three) times daily as needed for dizziness or nausea. 10/26/14  Yes Earleen Newport, MD  pantoprazole (PROTONIX) 40 MG tablet Take 1 tablet (40 mg total) by mouth daily.  03/20/16  Yes Epifanio Lesches, MD  TRESIBA FLEXTOUCH 100 UNIT/ML SOPN FlexTouch Pen Inject 24 Units into the skin every morning.  01/25/16  Yes Historical Provider, MD  [place] X 4 MM MISC  02/05/16   Historical Provider, MD    Allergies as of 04/08/2016  . (No Known Allergies)    Family History  Problem Relation Age of Onset  . Hypertension Mother   . Hypertension Father   . Breast cancer Neg Hx     Social History   Social History  . Marital status: Single    Spouse name: N/A  . Number of children: N/A  . Years of education: N/A   Occupational History  . works at [employer]  . Smoking status: Current Some Day Smoker    Packs/day: 0.50    Years: 20.00    Types: Cigarettes  . Smokeless tobacco: Never Used  . Alcohol use Yes     Comment:  occasionally drinks beer  . Drug use: No  . Sexual activity: Yes    Birth control/ protection: Post-menopausal   Other Topics Concern  . Not on file   Social History Narrative  . No narrative on file    Review of Systems: See HPI, otherwise negative ROS  Physical Exam: BP 133/82   Pulse 75   Temp 97.4 F (36.3 C) (Tympanic)   Resp 18  Ht 5' (1.524 m)   Wt 148 lb (67.1 kg)   LMP 11/17/2014   SpO2 100%   BMI 28.90 kg/m  General:   Alert,  pleasant and cooperative in NAD Head:  Normocephalic and atraumatic. Neck:  Supple; no masses or thyromegaly. Lungs:  Clear throughout to auscultation.    Heart:  Regular rate and rhythm. Abdomen:  Soft, nontender and nondistended. Normal bowel sounds, without guarding, and without rebound.   Neurologic:  Alert and  oriented x4;  grossly normal neurologically.  Impression/Plan: Lori Larsen is here for an endoscopy and colonoscopy to be performed for iron deficiency anemia   Risks, benefits, limitations, and alternatives regarding  endoscopy and colonoscopy have been reviewed with the patient.  Questions have been answered.   All parties agreeable.   Jonathon Bellows, MD  04/18/2016, 2:02 PM

## 2016-04-18 NOTE — Anesthesia Postprocedure Evaluation (Signed)
Anesthesia Post Note  Patient: Lori Larsen  Procedure(s) Performed: Procedure(s) (LRB): COLONOSCOPY WITH PROPOFOL (N/A) ESOPHAGOGASTRODUODENOSCOPY (EGD) WITH PROPOFOL (N/A)  Patient location during evaluation: Endoscopy Anesthesia Type: General Level of consciousness: awake and alert Pain management: pain level controlled Vital Signs Assessment: post-procedure vital signs reviewed and stable Respiratory status: spontaneous breathing and respiratory function stable Cardiovascular status: stable Anesthetic complications: no     Last Vitals:  Vitals:   04/18/16 1501 04/18/16 1511  BP: 109/75 119/83  Pulse: 74 74  Resp: 18 11  Temp:      Last Pain:  Vitals:   04/18/16 1451  TempSrc: Tympanic                 [name] K

## 2016-04-18 NOTE — Brief Op Note (Signed)
Hepatic flexure polyp hot snare not retrieved.

## 2016-04-18 NOTE — Anesthesia Preprocedure Evaluation (Signed)
Anesthesia Evaluation  Patient identified by MRN, date of birth, ID band Patient awake    Reviewed: Allergy & Precautions, NPO status , Patient's Chart, lab work & pertinent test results  History of Anesthesia Complications Negative for: history of anesthetic complications  Airway Mallampati: III       Dental  (+) Edentulous Upper, Edentulous Lower   Pulmonary Current Smoker,           Cardiovascular hypertension, Pt. on medications      Neuro/Psych CVA (right-sided weakness, resolved; right sided numbness still present), Residual Symptoms    GI/Hepatic negative GI ROS, Neg liver ROS,   Endo/Other  diabetes, Type 2, Oral Hypoglycemic Agents  Renal/GU negative Renal ROS     Musculoskeletal   Abdominal   Peds  Hematology  (+) anemia ,   Anesthesia Other Findings   Reproductive/Obstetrics                             Anesthesia Physical Anesthesia Plan  ASA: III  Anesthesia Plan: General   Post-op Pain Management:    Induction: Intravenous  Airway Management Planned: Nasal Cannula  Additional Equipment:   Intra-op Plan:   Post-operative Plan:   Informed Consent: I have reviewed the patients History and Physical, chart, labs and discussed the procedure including the risks, benefits and alternatives for the proposed anesthesia with the patient or authorized representative who has indicated his/her understanding and acceptance.     Plan Discussed with:   Anesthesia Plan Comments:         Anesthesia Quick Evaluation

## 2016-04-18 NOTE — Anesthesia Post-op Follow-up Note (Signed)
Anesthesia QCDR form completed.        

## 2016-04-21 ENCOUNTER — Encounter: Payer: Self-pay | Admitting: Gastroenterology

## 2016-04-22 LAB — SURGICAL PATHOLOGY

## 2016-04-23 ENCOUNTER — Encounter: Payer: Self-pay | Admitting: Gastroenterology

## 2016-05-29 ENCOUNTER — Telehealth: Payer: Self-pay

## 2016-05-29 ENCOUNTER — Other Ambulatory Visit: Payer: Self-pay

## 2016-05-29 DIAGNOSIS — N938 Other specified abnormal uterine and vaginal bleeding: Secondary | ICD-10-CM | POA: Insufficient documentation

## 2016-05-29 DIAGNOSIS — Z0000 Encounter for general adult medical examination without abnormal findings: Secondary | ICD-10-CM | POA: Insufficient documentation

## 2016-05-29 DIAGNOSIS — K3189 Other diseases of stomach and duodenum: Secondary | ICD-10-CM

## 2016-05-29 DIAGNOSIS — Z23 Encounter for immunization: Secondary | ICD-10-CM | POA: Insufficient documentation

## 2016-05-29 DIAGNOSIS — IMO0001 Reserved for inherently not codable concepts without codable children: Secondary | ICD-10-CM | POA: Insufficient documentation

## 2016-05-29 DIAGNOSIS — N949 Unspecified condition associated with female genital organs and menstrual cycle: Secondary | ICD-10-CM | POA: Insufficient documentation

## 2016-05-29 DIAGNOSIS — R202 Paresthesia of skin: Secondary | ICD-10-CM | POA: Insufficient documentation

## 2016-05-29 DIAGNOSIS — R5383 Other fatigue: Secondary | ICD-10-CM | POA: Insufficient documentation

## 2016-05-29 DIAGNOSIS — Z9229 Personal history of other drug therapy: Secondary | ICD-10-CM | POA: Insufficient documentation

## 2016-05-29 DIAGNOSIS — E7800 Pure hypercholesterolemia, unspecified: Secondary | ICD-10-CM | POA: Insufficient documentation

## 2016-05-29 DIAGNOSIS — E782 Mixed hyperlipidemia: Secondary | ICD-10-CM | POA: Insufficient documentation

## 2016-05-29 NOTE — Telephone Encounter (Signed)
Called pt to schedule UEUS to evaluate gastric submucosal lesion.

## 2016-05-30 ENCOUNTER — Telehealth: Payer: Self-pay

## 2016-05-30 NOTE — Telephone Encounter (Signed)
  Oncology Nurse Navigator Documentation Received referral for EUS from Dr. Vicente Males for evaluation of submucosal lesions. Patient requesting week of April 9-13. Voicemail left with Ms. Marian to return call for scheduling. Navigator Location: [facility] (05/30/16 1300)   )Navigator Encounter Type: Telephone (05/30/16 1300) Telephone: [place] Call (05/30/16 1300)                       Barriers/Navigation Needs: Coordination of Care (05/30/16 1300)   Interventions: Coordination of Care (05/30/16 1300)   Coordination of Care: EUS (05/30/16 1300)                  Time Spent with Patient: 15 (05/30/16 1300)

## 2016-06-02 ENCOUNTER — Other Ambulatory Visit: Payer: Self-pay

## 2016-06-02 ENCOUNTER — Other Ambulatory Visit: Payer: Self-pay | Admitting: *Deleted

## 2016-06-02 ENCOUNTER — Telehealth: Payer: Self-pay

## 2016-06-02 DIAGNOSIS — D649 Anemia, unspecified: Secondary | ICD-10-CM

## 2016-06-02 NOTE — Telephone Encounter (Signed)
Oncology Nurse Navigator Documentation Called and spoke with Lori Larsen regarding referral for EUS to evaluate gastric submucosal lesions. EUS scheduled for 06/26/16 with Dr. Mont Dutton at [facility]. Went over instructions with her and copy also mailed to home address along with my contact information for any future questions. She reports taking ASA and Plavix prescribed by Dr. Melrose Nakayama. I will obtain permission for her to hold these prior to EUS. Her reports taking insulin. Instructed to notify prescribing physician as they may want to alter her dose accordingly.   INSTRUCTIONS FOR ENDOSCOPIC ULTRASOUND -Your procedure has been scheduled for April 19th with Dr. Mont Dutton at [facility]. -The hospital will contact you to pre-register over the phone.  -To get your scheduled arrival time, please call the Endoscopy unit at  [phone] between 1-3 p.m. on:  April 18th   -ON THE DAY OF YOU PROCEDURE:   1. If you are scheduled for a morning procedure, nothing to drink after midnight  -If you are scheduled for an afternoon procedure, you may have clear liquids until 5 hours prior  to the procedure but no carbonated drinks or broth  2. NO FOOD THE DAY OF YOUR PROCEDURE  3. You may take your heart, seizure, blood pressure, Parkinson's or breathing medications at  6am with just enough water to get your pills down  4. Do not take any oral Diabetic medications the morning of your procedure.  5. If you are a diabetic and are using insulin, please notify your prescribing physician of this  procedure as your dose may need to be altered related to not being able to eat or drink.   5. Do not take vitamins or fish oil for 5 days before your procedure     -On the day of your procedure, come to the [facility] Admitting/Registration desk (First desk on the right) at the scheduled arrival time. You MUST have someone drive you home from your procedure. You must have a responsible adult with  a valid driver's license who is on site throughout your entire procedure and who can stay with you for several hours after your procedure. You may not go home alone in a taxi, shuttle Ava or bus, as the drivers will not be responsible for you.  --If you have any questions please call me at the above contact  [place]                                                                      [phone]                                                                 [phone]  Risk Assessment Form      Patient Name: Lori Larsen                                  DOB: 05/22/1965   Is scheduled for an elective procedure.   Procedure/s: Endoscopic Ultrasound  Date: 06/26/2016    Physician: Dr. Cephas Darby   Type of anesthesia: Monitored   Office contact person: Mariea Clonts RN    Fax number: [phone]     We are requesting risk assessment from: Dr. Gurney Maxin   Name of medication: Plavix and Aspirin                      _____discontinue 7 days prior to procedure.                     ___X_discontinue 5 days prior to procedure.                     _____discontinue 7 days prior to procedure & bridge with low  weight Heparin i.e.      Date: __________________________    Navigator Location: [facility] (06/02/16 1500)   )Navigator Encounter Type: Telephone (06/02/16 1500) Telephone: [place] Crocker Call (06/02/16 1500)                       Barriers/Navigation Needs: Coordination of Care (06/02/16 1500)   Interventions: Coordination of Care (06/02/16 1500)   Coordination of Care: EUS (06/02/16 1500)                  Time Spent with Patient: 45 (06/02/16 1500)

## 2016-06-03 ENCOUNTER — Inpatient Hospital Stay: Payer: Managed Care, Other (non HMO) | Attending: Oncology

## 2016-06-03 ENCOUNTER — Encounter: Payer: Self-pay | Admitting: Oncology

## 2016-06-03 ENCOUNTER — Inpatient Hospital Stay (HOSPITAL_BASED_OUTPATIENT_CLINIC_OR_DEPARTMENT_OTHER): Payer: Managed Care, Other (non HMO) | Admitting: Oncology

## 2016-06-03 ENCOUNTER — Other Ambulatory Visit: Payer: Self-pay | Admitting: *Deleted

## 2016-06-03 VITALS — BP 122/82 | HR 89 | Temp 96.4°F | Resp 16 | Ht 60.0 in | Wt 146.2 lb

## 2016-06-03 DIAGNOSIS — Z79899 Other long term (current) drug therapy: Secondary | ICD-10-CM | POA: Insufficient documentation

## 2016-06-03 DIAGNOSIS — Z7984 Long term (current) use of oral hypoglycemic drugs: Secondary | ICD-10-CM | POA: Insufficient documentation

## 2016-06-03 DIAGNOSIS — E119 Type 2 diabetes mellitus without complications: Secondary | ICD-10-CM | POA: Diagnosis not present

## 2016-06-03 DIAGNOSIS — Z8673 Personal history of transient ischemic attack (TIA), and cerebral infarction without residual deficits: Secondary | ICD-10-CM

## 2016-06-03 DIAGNOSIS — D649 Anemia, unspecified: Secondary | ICD-10-CM | POA: Insufficient documentation

## 2016-06-03 DIAGNOSIS — Z86010 Personal history of colonic polyps: Secondary | ICD-10-CM | POA: Diagnosis not present

## 2016-06-03 DIAGNOSIS — F17210 Nicotine dependence, cigarettes, uncomplicated: Secondary | ICD-10-CM

## 2016-06-03 DIAGNOSIS — R5383 Other fatigue: Secondary | ICD-10-CM | POA: Diagnosis not present

## 2016-06-03 DIAGNOSIS — Z7982 Long term (current) use of aspirin: Secondary | ICD-10-CM | POA: Diagnosis not present

## 2016-06-03 DIAGNOSIS — I10 Essential (primary) hypertension: Secondary | ICD-10-CM | POA: Diagnosis not present

## 2016-06-03 LAB — CBC
HEMATOCRIT: 28.1 % — AB (ref 35.0–47.0)
HEMOGLOBIN: 9.7 g/dL — AB (ref 12.0–16.0)
MCH: 31 pg (ref 26.0–34.0)
MCHC: 34.3 g/dL (ref 32.0–36.0)
MCV: 90.2 fL (ref 80.0–100.0)
PLATELETS: 392 10*3/uL (ref 150–440)
RBC: 3.12 MIL/uL — AB (ref 3.80–5.20)
RDW: 14 % (ref 11.5–14.5)
WBC: 14.1 10*3/uL — AB (ref 3.6–11.0)

## 2016-06-03 LAB — IRON AND TIBC
Iron: 86 ug/dL (ref 28–170)
Saturation Ratios: 32 % — ABNORMAL HIGH (ref 10.4–31.8)
TIBC: 270 ug/dL (ref 250–450)
UIBC: 184 ug/dL

## 2016-06-03 LAB — FERRITIN: Ferritin: 177 ng/mL (ref 11–307)

## 2016-06-03 NOTE — Progress Notes (Signed)
Patient here today for follow up.  Patient states no new concerns today  

## 2016-06-03 NOTE — Progress Notes (Signed)
Hematology/Oncology Consult note [facility]  Telephone:([phone] [phone]  Patient Care Team: Nino Glow McLean-Scocuzza, MD as PCP - General (Internal Medicine)   Name of the patient: Lori Larsen  [id number]  Jul 29, 1965   Date of visit: 06/03/16  Diagnosis- normocytic anemia of unclear etiology  Chief complaint/ Reason for visit- routine f/u  Heme/Onc history:  patient is a 51 year old female with a past medical history significant for hypertension and diabetes presented to the ER this morning after she passed out at work on 03/18/16. Patient reports symptoms of fatigue and feeling tired over the last couple of weeks prior to that. Patient denied any history of bright red blood in stool or dark tarry stoolsdenies any gum bleeds, nosebleeds and she has not thrown up or coughed up blood. No h/o vaginal bleeding Patient had a stool guaiac in the ER which was negative. CBC done in the ER showed white count of 6.3, H&H of 6.9/20.1 and a platelet count of 154. On looking at her prior CBCs from 2016 she was known to have some anemia with a hemoglobin between 8.6-10.6. Her prior CBC from 10/03/2015 revealed H&H of 10.9/32.9.   Ferritin was 105 with normal iron studies. Folate was normal at 22.4. Reticulocyte count was 1% (inappropriately low for the degree of anemia). CMP was normal except for a blood sugar of 206 with a normal total protein and normal total bilirubin. CT head and CT cervical spine did not reveal any acute pathology.Denies any family history of colon cancer  Patient was seen by GI who recommended a complete workup including EGD and colonoscopy plus minus capsule endoscopy. EGD on 04/18/2016 showed normal duodenum which was biopsied for celiac disease. Normal esophagus. There was a medium sized submucosal mass with no bleeding or stigmata of recent bleeding noted in the gastric region. She is awaiting to undergo EUS for the same.  The  results of blood work from her anemia workup or as follows: Ferritin was normal at 105. Iron studies were normal. LDH was normal at 127. Haptoglobin was normal and Coombs test was negative. Both And lambda free light chains were elevated but the ratio was normal at 1.42. SPEP did not reveal any monoclonal protein. B12 and folate levels were within normal limits. 24 hour urine protein revealed free kappa And lambda light chains and elevated Lambda ratio of 20.5. Immunofixation pattern in the protein was normal. Reticulocyte count was inappropriately low for the degree of anemia at 1%  Review of peripheral smear revealed adequate platelets. RBCs are normochromic and normocytic. Leukocyte count and differential within normal limits. And no abnormal or immature cells identified. Colonoscopy showed 2 polyps one in the sigmoid colon and the other in the hepatic flexure. Duodenal biopsy was negative for celiac disease. 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  Interval history- reports doing well. Denies any complaints. Denies any blood in stool or urine  ECOG PS- 1   Review of systems- Review of Systems  Constitutional: Negative for chills, fever, malaise/fatigue and weight loss.  HENT: Negative for congestion, ear discharge and nosebleeds.   Eyes: Negative for blurred vision.  Respiratory: Negative for cough, hemoptysis, sputum production, shortness of breath and wheezing.   Cardiovascular: Negative for chest pain, palpitations, orthopnea and claudication.  Gastrointestinal: Negative for abdominal pain, blood in stool, constipation, diarrhea, heartburn, melena, nausea and vomiting.  Genitourinary: Negative for dysuria, flank pain, frequency, hematuria and urgency.  Musculoskeletal:  Negative for back pain, joint pain and myalgias.  Skin: Negative for rash.  Neurological: Negative for dizziness, tingling, focal weakness, seizures, weakness and  headaches.  Endo/Heme/Allergies: Does not bruise/bleed easily.  Psychiatric/Behavioral: Negative for depression and suicidal ideas. The patient does not have insomnia.      Current treatment- oral iron  No Known Allergies   Past Medical History:  Diagnosis Date  . Diabetes mellitus without complication ([place])   . Hypertension   . Stroke [facility])      Past Surgical History:  Procedure Laterality Date  . CESAREAN SECTION    . COLONOSCOPY WITH PROPOFOL N/A 04/18/2016   Procedure: COLONOSCOPY WITH PROPOFOL;  Surgeon: Jonathon Bellows, MD;  Location: [facility] ENDOSCOPY;  Service: Endoscopy;  Laterality: N/A;  . ESOPHAGOGASTRODUODENOSCOPY (EGD) WITH PROPOFOL N/A 04/18/2016   Procedure: ESOPHAGOGASTRODUODENOSCOPY (EGD) WITH PROPOFOL;  Surgeon: Jonathon Bellows, MD;  Location: [facility] ENDOSCOPY;  Service: Endoscopy;  Laterality: N/A;    Social History   Social History  . Marital status: Single    Spouse name: N/A  . Number of children: N/A  . Years of education: N/A   Occupational History  . works at [employer]  . Smoking status: Current Some Day Smoker    Packs/day: 0.50    Years: 20.00    Types: Cigarettes  . Smokeless tobacco: Never Used  . Alcohol use Yes     Comment:  occasionally drinks beer  . Drug use: No  . Sexual activity: Yes    Birth control/ protection: Post-menopausal   Other Topics Concern  . Not on file   Social History Narrative  . No narrative on file    Family History  Problem Relation Age of Onset  . Hypertension Mother   . Hypertension Father   . Breast cancer Neg Hx      Current Outpatient Prescriptions:  .  amLODipine (NORVASC) 2.5 MG tablet, Take 2.5 mg by mouth daily., Disp: , Rfl:  .  aspirin 325 MG tablet, Take 1 tablet (325 mg total) by mouth daily., Disp: 120 tablet, Rfl: 0 .  atorvastatin (LIPITOR) 40 MG tablet, Take 1 tablet (40 mg total) by mouth daily at 6 PM., Disp: 30 tablet, Rfl: 0 .  clopidogrel (PLAVIX) 75  MG tablet, Take 75 mg by mouth daily., Disp: , Rfl:  .  ferrous sulfate 325 (65 FE) MG tablet, Take 1 tablet by mouth 2 (two) times daily., Disp: , Rfl:  .  gabapentin (NEURONTIN) 100 MG capsule, Take 1 capsule by mouth 2 (two) times daily. , Disp: , Rfl:  .  glipiZIDE (GLUCOTROL) 5 MG tablet, Take 1 tablet (5 mg total) by mouth daily., Disp: 30 tablet, Rfl: 11 .  hydrochlorothiazide (HYDRODIURIL) 25 MG tablet, Take 1 tablet by mouth daily., Disp: , Rfl:  .  JANUVIA 50 MG tablet, , Disp: , Rfl:  .  lisinopril (PRINIVIL,ZESTRIL) 40 MG tablet, Take 1 tablet by mouth every morning., Disp: , Rfl:  .  meclizine (ANTIVERT) 25 MG tablet, Take 1 tablet (25 mg total) by mouth 3 (three) times daily as needed for dizziness or nausea., Disp: 30 tablet, Rfl: 1 .  nicotine (NICODERM CQ - DOSED IN MG/24 HOURS) 21 mg/24hr patch, Place onto the skin., Disp: , Rfl:  .  NOVOFINE PLUS 32G X 4 MM MISC, , Disp: , Rfl:  .  pantoprazole (PROTONIX) 40 MG tablet, Take 1 tablet (40 mg total) by mouth daily., Disp: 30 tablet, Rfl:  0 .  TRESIBA FLEXTOUCH 100 UNIT/ML SOPN FlexTouch Pen, Inject 24 Units into the skin every morning. , Disp: , Rfl: 2  Physical exam:  Vitals:   06/03/16 1440  BP: 122/82  Pulse: 89  Resp: 16  Temp: (!) 96.4 F (35.8 C)  TempSrc: Tympanic  Weight: 146 lb 3 oz (66.3 kg)  Height: 5' (1.524 m)   Physical Exam  Constitutional: She is oriented to person, place, and time and well-developed, well-nourished, and in no distress.  HENT:  Head: Normocephalic and atraumatic.  Eyes: EOM are normal. Pupils are equal, round, and reactive to light.  Neck: Normal range of motion.  Cardiovascular: Normal rate, regular rhythm and normal heart sounds.   Pulmonary/Chest: Effort normal and breath sounds normal.  Abdominal: Soft. Bowel sounds are normal.  Neurological: She is alert and oriented to person, place, and time.  Skin: Skin is warm and dry.     CMP Latest Ref Rng & Units 03/19/2016  Glucose 65  - 99 mg/dL 236(H)  BUN 6 - 20 mg/dL 19  Creatinine 0.44 - 1.00 mg/dL 0.77  Sodium 135 - 145 mmol/L 136  Potassium 3.5 - 5.1 mmol/L 3.9  Chloride 101 - 111 mmol/L 107  CO2 22 - 32 mmol/L 23  Calcium 8.9 - 10.3 mg/dL 8.6(L)  Total Protein 6.5 - 8.1 g/dL -  Total Bilirubin 0.3 - 1.2 mg/dL -  Alkaline Phos 38 - 126 U/L -  AST 15 - 41 U/L -  ALT 14 - 54 U/L -   CBC Latest Ref Rng & Units 06/03/2016  WBC 3.6 - 11.0 K/uL 14.1(H)  Hemoglobin 12.0 - 16.0 g/dL 9.7(L)  Hematocrit 35.0 - 47.0 % 28.1(L)  Platelets 150 - 440 K/uL 392     Assessment and plan- Patient is a 51 y.o. female with normocytic anemia of unclear etiology  All the workup for anemia as mentioned above has been unrevealing so far. EGD and colonoscopy did not reveal any source of bleeding. Patient presented with an acute drop in her hemoglobin to 6.3 in January 2018 which since then has improved. Today her hemoglobin is 9.7 which is lower than what it was in January when it was closer to 12. At this time I will repeat her CBC in 2 months time along with iron studies. If her anemia were to be worse, I will proceed with a bone marrow biopsy at that time to see if there is any primary hematological disorder in the bone marrow that is contributing to her anemia. If her hemoglobin is stable between 9-11 over a period of time I'm inclined to monitor this conservatively without a bone marrow biopsy. I will see her back in 4 months time with repeat CBC and iron studies   Visit Diagnosis 1. Normocytic anemia      Dr. Randa Evens, MD, MPH [facility] Pager- [phone] 06/03/2016 2:31 PM

## 2016-06-04 LAB — KAPPA/LAMBDA LIGHT CHAINS
KAPPA FREE LGHT CHN: 29.3 mg/L — AB (ref 3.3–19.4)
KAPPA, LAMDA LIGHT CHAIN RATIO: 1.26 (ref 0.26–1.65)
Lambda free light chains: 23.2 mg/L (ref 5.7–26.3)

## 2016-06-13 ENCOUNTER — Telehealth: Payer: Self-pay

## 2016-06-13 NOTE — Telephone Encounter (Signed)
  Oncology Nurse Navigator Documentation Received clearance from Dr. Lannie Fields office to hold Plavix and Aspirin for 5 days prior to EUS. Instructed Ms. Pollack to take her Plavix and Aspirin on 4/13 then stop both of them. She will be instructed when to resume them after her EUS on 4/19. Copy of this also mailed to her home address and she was notified of that. Navigator Location: [facility] (06/13/16 1500)   )Navigator Encounter Type: Treatment (06/13/16 1500)                                                    Time Spent with Patient: 15 (06/13/16 1500)

## 2016-06-26 ENCOUNTER — Ambulatory Visit: Payer: Managed Care, Other (non HMO) | Admitting: Anesthesiology

## 2016-06-26 ENCOUNTER — Ambulatory Visit
Admission: RE | Admit: 2016-06-26 | Discharge: 2016-06-26 | Disposition: A | Discharge status: Home / Self Care | Payer: Managed Care, Other (non HMO) | Source: Ambulatory Visit | Attending: Internal Medicine | Admitting: Internal Medicine

## 2016-06-26 ENCOUNTER — Encounter
Admission: RE | Disposition: A | Discharge status: Home / Self Care | Payer: Self-pay | Source: Ambulatory Visit | Attending: Internal Medicine

## 2016-06-26 ENCOUNTER — Encounter: Payer: Self-pay | Admitting: *Deleted

## 2016-06-26 DIAGNOSIS — R1909 Other intra-abdominal and pelvic swelling, mass and lump: Secondary | ICD-10-CM | POA: Diagnosis present

## 2016-06-26 DIAGNOSIS — F17210 Nicotine dependence, cigarettes, uncomplicated: Secondary | ICD-10-CM | POA: Insufficient documentation

## 2016-06-26 DIAGNOSIS — I10 Essential (primary) hypertension: Secondary | ICD-10-CM | POA: Diagnosis not present

## 2016-06-26 DIAGNOSIS — K319 Disease of stomach and duodenum, unspecified: Secondary | ICD-10-CM | POA: Diagnosis not present

## 2016-06-26 DIAGNOSIS — E119 Type 2 diabetes mellitus without complications: Secondary | ICD-10-CM | POA: Insufficient documentation

## 2016-06-26 DIAGNOSIS — Z7902 Long term (current) use of antithrombotics/antiplatelets: Secondary | ICD-10-CM | POA: Diagnosis not present

## 2016-06-26 DIAGNOSIS — I69398 Other sequelae of cerebral infarction: Secondary | ICD-10-CM | POA: Diagnosis not present

## 2016-06-26 DIAGNOSIS — Z7982 Long term (current) use of aspirin: Secondary | ICD-10-CM | POA: Diagnosis not present

## 2016-06-26 DIAGNOSIS — D649 Anemia, unspecified: Secondary | ICD-10-CM | POA: Diagnosis not present

## 2016-06-26 DIAGNOSIS — R200 Anesthesia of skin: Secondary | ICD-10-CM | POA: Insufficient documentation

## 2016-06-26 DIAGNOSIS — Z794 Long term (current) use of insulin: Secondary | ICD-10-CM | POA: Insufficient documentation

## 2016-06-26 HISTORY — PX: EUS: SHX5427

## 2016-06-26 LAB — GLUCOSE, CAPILLARY: Glucose-Capillary: 123 mg/dL — ABNORMAL HIGH (ref 65–99)

## 2016-06-26 SURGERY — ULTRASOUND, UPPER GI TRACT, ENDOSCOPIC
Anesthesia: General

## 2016-06-26 MED ORDER — SODIUM CHLORIDE 0.9 % IV SOLN
INTRAVENOUS | Status: DC
  Administered 2016-06-26: 11:00:00 via INTRAVENOUS

## 2016-06-26 MED ORDER — PROPOFOL 500 MG/50ML IV EMUL
INTRAVENOUS | Status: DC | PRN
  Administered 2016-06-26: 160 ug/kg/min via INTRAVENOUS

## 2016-06-26 MED ORDER — LIDOCAINE HCL (CARDIAC) 20 MG/ML IV SOLN
INTRAVENOUS | Status: DC | PRN
  Administered 2016-06-26: 60 mg via INTRAVENOUS

## 2016-06-26 MED ORDER — PROPOFOL 500 MG/50ML IV EMUL
INTRAVENOUS | Status: AC
  Filled 2016-06-26: qty 50

## 2016-06-26 MED ORDER — PROPOFOL 10 MG/ML IV BOLUS
INTRAVENOUS | Status: DC | PRN
  Administered 2016-06-26: 30 mg via INTRAVENOUS
  Administered 2016-06-26: 50 mg via INTRAVENOUS

## 2016-06-26 MED ORDER — GLYCOPYRROLATE 0.2 MG/ML IJ SOLN
INTRAMUSCULAR | Status: DC | PRN
Start: 2016-06-26 — End: 2016-06-26
  Administered 2016-06-26: 0.2 mg via INTRAVENOUS

## 2016-06-26 MED ORDER — LIDOCAINE HCL (PF) 2 % IJ SOLN
INTRAMUSCULAR | Status: AC
  Filled 2016-06-26: qty 2

## 2016-06-26 MED ORDER — IPRATROPIUM-ALBUTEROL 0.5-2.5 (3) MG/3ML IN SOLN
3.0000 mL | Freq: Once | RESPIRATORY_TRACT | Status: AC
  Administered 2016-06-26: 3 mL via RESPIRATORY_TRACT
  Filled 2016-06-26: qty 3

## 2016-06-26 NOTE — Discharge Instructions (Signed)
Discharge to home °

## 2016-06-26 NOTE — Op Note (Signed)
[facility] Patient Name: Lori Larsen Procedure Date: 06/26/2016 11:15 AM MRN: [id number] Account #: [id number] Date of Birth: 12/02/1965 Admit Type: Outpatient Age: 51 Room: [facility] ENDO ROOM 3 Gender: Female Note Status: Finalized Procedure:            Upper EUS Indications:          Gastric mucosal mass/polyp found on endoscopy Patient Profile:      Refer to note in patient chart for documentation of                        history and physical. Providers:            Murray Hodgkins. Keisha Amer Referring MD:         Jonathon Bellows MD, MD (Referring MD) Medicines:            Propofol per Anesthesia Complications:        No immediate complications. Procedure:            Pre-Anesthesia Assessment:                       Prior to the procedure, a History and Physical was                        performed, and patient medications and allergies were                        reviewed. The patient is competent. The risks and                        benefits of the procedure and the sedation options and                        risks were discussed with the patient. All questions                        were answered and informed consent was obtained.                        Patient identification and proposed procedure were                        verified by the physician, the nurse and the                        anesthetist in the pre-procedure area. Mental Status                        Examination: alert and oriented. Airway Examination:                        normal oropharyngeal airway and neck mobility.                        Respiratory Examination: clear to auscultation. CV                        Examination: normal. Prophylactic Antibiotics: The                        patient does not require prophylactic antibiotics.  Prior Anticoagulants: The patient has taken no previous                        anticoagulant or antiplatelet agents. ASA  Grade                        Assessment: II - A patient with mild systemic disease.                        After reviewing the risks and benefits, the patient was                        deemed in satisfactory condition to undergo the                        procedure. The anesthesia plan was to use monitored                        anesthesia care (MAC). Immediately prior to                        administration of medications, the patient was                        re-assessed for adequacy to receive sedatives. The                        heart rate, respiratory rate, oxygen saturations, blood                        pressure, adequacy of pulmonary ventilation, and                        response to care were monitored throughout the                        procedure. The physical status of the patient was                        re-assessed after the procedure.                       After obtaining informed consent, the endoscope was                        passed under direct vision. Throughout the procedure,                        the patient's blood pressure, pulse, and oxygen                        saturations were monitored continuously. The                        Colonoscope was introduced through the mouth, and                        advanced to the second part of duodenum. The EUS GI  Radial Array [id number] was introduced through the mouth,                        and advanced to the stomach for ultrasound examination                        from the esophagus and stomach. The upper EUS was                        accomplished without difficulty. The patient tolerated                        the procedure well. Findings:      Endoscopic Finding :      The examined esophagus was endoscopically normal.      A single small submucosal nodule was found in the gastric antrum.      The exam of the stomach was otherwise normal.      The examined duodenum was endoscopically  normal.      Endosonographic Finding :      An oval intramural (subepithelial) lesion was found in the antrum of the       stomach. The lesion was hyperechoic. Sonographically, the lesion       appeared to originate from the submucosa (Layer 3). The lesion measured       11.4 mm by 10.5 mm in diameter. The outer endosonographic borders were       well defined.      Endosonographic imaging in the left lobe of the liver showed no       abnormalities.      No lymphadenopathy seen.      The celiac region was visualized and showed no sign of significant       endosonographic abnormality. Impression:           EGD Impressions:                       - Normal esophagus.                       - A single submucosal nodule was found in the stomach.                       - Normal examined duodenum.                       EUS Impressions:                       - An intramural (subepithelial) lesion was found in the                        antrum of the stomach. The lesion appeared to originate                        from within the submucosa (Layer 3). Tissue has not                        been obtained. 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 Biopsies not performed  given the classic endoscopic and endosonographic                        appearance of a lipoma.                       - Normal visualized portions of the liver.                       - No lymphadenopathy seen.                       - Normal celiac region.                       - No specimens collected. Recommendation:       - Discharge patient to home (ambulatory).                       - The findings and recommendations were discussed with                        the patient and her family.                       - Return to referring physician as previously scheduled. Procedure Code(s):    --- Professional ---                       [phone],  Esophagogastroduodenoscopy, flexible, transoral;                        with endoscopic ultrasound examination limited to the                        esophagus, stomach or duodenum, and adjacent structures Diagnosis Code(s):    --- Professional ---                       K31.89, Other diseases of stomach and duodenum CPT copyright 2016 American Medical Association. All rights reserved. The codes documented in this report are preliminary and upon coder review may  be revised to meet current compliance requirements. Attending Participation:      I personally performed the entire procedure without the assistance of a       fellow, resident or surgical assistant. [place],  06/26/2016 11:45:26 AM This report has been signed electronically. Number of Addenda: 0 Note Initiated On: 06/26/2016 11:15 AM Estimated Blood Loss: Estimated blood loss: none.      [facility]

## 2016-06-26 NOTE — H&P (View-Only) (Signed)
Hematology/Oncology Consult note [facility]  Telephone:([phone] [phone]  Patient Care Team: Nino Glow McLean-Scocuzza, MD as PCP - General (Internal Medicine)   Name of the patient: Lori Larsen  [id number]  08/21/1965   Date of visit: 06/03/16  Diagnosis- normocytic anemia of unclear etiology  Chief complaint/ Reason for visit- routine f/u  Heme/Onc history:  patient is a 51 year old female with a past medical history significant for hypertension and diabetes presented to the ER this morning after she passed out at work on 03/18/16. Patient reports symptoms of fatigue and feeling tired over the last couple of weeks prior to that. Patient denied any history of bright red blood in stool or dark tarry stoolsdenies any gum bleeds, nosebleeds and she has not thrown up or coughed up blood. No h/o vaginal bleeding Patient had a stool guaiac in the ER which was negative. CBC done in the ER showed white count of 6.3, H&H of 6.9/20.1 and a platelet count of 154. On looking at her prior CBCs from 2016 she was known to have some anemia with a hemoglobin between 8.6-10.6. Her prior CBC from 10/03/2015 revealed H&H of 10.9/32.9.   Ferritin was 105 with normal iron studies. Folate was normal at 22.4. Reticulocyte count was 1% (inappropriately low for the degree of anemia). CMP was normal except for a blood sugar of 206 with a normal total protein and normal total bilirubin. CT head and CT cervical spine did not reveal any acute pathology.Denies any family history of colon cancer  Patient was seen by GI who recommended a complete workup including EGD and colonoscopy plus minus capsule endoscopy. EGD on 04/18/2016 showed normal duodenum which was biopsied for celiac disease. Normal esophagus. There was a medium sized submucosal mass with no bleeding or stigmata of recent bleeding noted in the gastric region. She is awaiting to undergo EUS for the same.  The  results of blood work from her anemia workup or as follows: Ferritin was normal at 105. Iron studies were normal. LDH was normal at 127. Haptoglobin was normal and Coombs test was negative. Both And lambda free light chains were elevated but the ratio was normal at 1.42. SPEP did not reveal any monoclonal protein. B12 and folate levels were within normal limits. 24 hour urine protein revealed free kappa And lambda light chains and elevated Lambda ratio of 20.5. Immunofixation pattern in the protein was normal. Reticulocyte count was inappropriately low for the degree of anemia at 1%  Review of peripheral smear revealed adequate platelets. RBCs are normochromic and normocytic. Leukocyte count and differential within normal limits. And no abnormal or immature cells identified. Colonoscopy showed 2 polyps one in the sigmoid colon and the other in the hepatic flexure. Duodenal biopsy was negative for celiac disease. 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  Interval history- reports doing well. Denies any complaints. Denies any blood in stool or urine  ECOG PS- 1   Review of systems- Review of Systems  Constitutional: Negative for chills, fever, malaise/fatigue and weight loss.  HENT: Negative for congestion, ear discharge and nosebleeds.   Eyes: Negative for blurred vision.  Respiratory: Negative for cough, hemoptysis, sputum production, shortness of breath and wheezing.   Cardiovascular: Negative for chest pain, palpitations, orthopnea and claudication.  Gastrointestinal: Negative for abdominal pain, blood in stool, constipation, diarrhea, heartburn, melena, nausea and vomiting.  Genitourinary: Negative for dysuria, flank pain, frequency, hematuria and urgency.  Musculoskeletal:  Negative for back pain, joint pain and myalgias.  Skin: Negative for rash.  Neurological: Negative for dizziness, tingling, focal weakness, seizures, weakness and  headaches.  Endo/Heme/Allergies: Does not bruise/bleed easily.  Psychiatric/Behavioral: Negative for depression and suicidal ideas. The patient does not have insomnia.      Current treatment- oral iron  No Known Allergies   Past Medical History:  Diagnosis Date  . Diabetes mellitus without complication ([place])   . Hypertension   . Stroke [facility])      Past Surgical History:  Procedure Laterality Date  . CESAREAN SECTION    . COLONOSCOPY WITH PROPOFOL N/A 04/18/2016   Procedure: COLONOSCOPY WITH PROPOFOL;  Surgeon: Jonathon Bellows, MD;  Location: [facility] ENDOSCOPY;  Service: Endoscopy;  Laterality: N/A;  . ESOPHAGOGASTRODUODENOSCOPY (EGD) WITH PROPOFOL N/A 04/18/2016   Procedure: ESOPHAGOGASTRODUODENOSCOPY (EGD) WITH PROPOFOL;  Surgeon: Jonathon Bellows, MD;  Location: [facility] ENDOSCOPY;  Service: Endoscopy;  Laterality: N/A;    Social History   Social History  . Marital status: Single    Spouse name: N/A  . Number of children: N/A  . Years of education: N/A   Occupational History  . works at [employer]  . Smoking status: Current Some Day Smoker    Packs/day: 0.50    Years: 20.00    Types: Cigarettes  . Smokeless tobacco: Never Used  . Alcohol use Yes     Comment:  occasionally drinks beer  . Drug use: No  . Sexual activity: Yes    Birth control/ protection: Post-menopausal   Other Topics Concern  . Not on file   Social History Narrative  . No narrative on file    Family History  Problem Relation Age of Onset  . Hypertension Mother   . Hypertension Father   . Breast cancer Neg Hx      Current Outpatient Prescriptions:  .  amLODipine (NORVASC) 2.5 MG tablet, Take 2.5 mg by mouth daily., Disp: , Rfl:  .  aspirin 325 MG tablet, Take 1 tablet (325 mg total) by mouth daily., Disp: 120 tablet, Rfl: 0 .  atorvastatin (LIPITOR) 40 MG tablet, Take 1 tablet (40 mg total) by mouth daily at 6 PM., Disp: 30 tablet, Rfl: 0 .  clopidogrel (PLAVIX) 75  MG tablet, Take 75 mg by mouth daily., Disp: , Rfl:  .  ferrous sulfate 325 (65 FE) MG tablet, Take 1 tablet by mouth 2 (two) times daily., Disp: , Rfl:  .  gabapentin (NEURONTIN) 100 MG capsule, Take 1 capsule by mouth 2 (two) times daily. , Disp: , Rfl:  .  glipiZIDE (GLUCOTROL) 5 MG tablet, Take 1 tablet (5 mg total) by mouth daily., Disp: 30 tablet, Rfl: 11 .  hydrochlorothiazide (HYDRODIURIL) 25 MG tablet, Take 1 tablet by mouth daily., Disp: , Rfl:  .  JANUVIA 50 MG tablet, , Disp: , Rfl:  .  lisinopril (PRINIVIL,ZESTRIL) 40 MG tablet, Take 1 tablet by mouth every morning., Disp: , Rfl:  .  meclizine (ANTIVERT) 25 MG tablet, Take 1 tablet (25 mg total) by mouth 3 (three) times daily as needed for dizziness or nausea., Disp: 30 tablet, Rfl: 1 .  nicotine (NICODERM CQ - DOSED IN MG/24 HOURS) 21 mg/24hr patch, Place onto the skin., Disp: , Rfl:  .  NOVOFINE PLUS 32G X 4 MM MISC, , Disp: , Rfl:  .  pantoprazole (PROTONIX) 40 MG tablet, Take 1 tablet (40 mg total) by mouth daily., Disp: 30 tablet, Rfl:  0 .  TRESIBA FLEXTOUCH 100 UNIT/ML SOPN FlexTouch Pen, Inject 24 Units into the skin every morning. , Disp: , Rfl: 2  Physical exam:  Vitals:   06/03/16 1440  BP: 122/82  Pulse: 89  Resp: 16  Temp: (!) 96.4 F (35.8 C)  TempSrc: Tympanic  Weight: 146 lb 3 oz (66.3 kg)  Height: 5' (1.524 m)   Physical Exam  Constitutional: She is oriented to person, place, and time and well-developed, well-nourished, and in no distress.  HENT:  Head: Normocephalic and atraumatic.  Eyes: EOM are normal. Pupils are equal, round, and reactive to light.  Neck: Normal range of motion.  Cardiovascular: Normal rate, regular rhythm and normal heart sounds.   Pulmonary/Chest: Effort normal and breath sounds normal.  Abdominal: Soft. Bowel sounds are normal.  Neurological: She is alert and oriented to person, place, and time.  Skin: Skin is warm and dry.     CMP Latest Ref Rng & Units 03/19/2016  Glucose 65  - 99 mg/dL 236(H)  BUN 6 - 20 mg/dL 19  Creatinine 0.44 - 1.00 mg/dL 0.77  Sodium 135 - 145 mmol/L 136  Potassium 3.5 - 5.1 mmol/L 3.9  Chloride 101 - 111 mmol/L 107  CO2 22 - 32 mmol/L 23  Calcium 8.9 - 10.3 mg/dL 8.6(L)  Total Protein 6.5 - 8.1 g/dL -  Total Bilirubin 0.3 - 1.2 mg/dL -  Alkaline Phos 38 - 126 U/L -  AST 15 - 41 U/L -  ALT 14 - 54 U/L -   CBC Latest Ref Rng & Units 06/03/2016  WBC 3.6 - 11.0 K/uL 14.1(H)  Hemoglobin 12.0 - 16.0 g/dL 9.7(L)  Hematocrit 35.0 - 47.0 % 28.1(L)  Platelets 150 - 440 K/uL 392     Assessment and plan- Patient is a 51 y.o. female with normocytic anemia of unclear etiology  All the workup for anemia as mentioned above has been unrevealing so far. EGD and colonoscopy did not reveal any source of bleeding. Patient presented with an acute drop in her hemoglobin to 6.3 in January 2018 which since then has improved. Today her hemoglobin is 9.7 which is lower than what it was in January when it was closer to 12. At this time I will repeat her CBC in 2 months time along with iron studies. If her anemia were to be worse, I will proceed with a bone marrow biopsy at that time to see if there is any primary hematological disorder in the bone marrow that is contributing to her anemia. If her hemoglobin is stable between 9-11 over a period of time I'm inclined to monitor this conservatively without a bone marrow biopsy. I will see her back in 4 months time with repeat CBC and iron studies   Visit Diagnosis 1. Normocytic anemia      Dr. Randa Evens, MD, MPH [facility] at [facility] Pager- [phone] 06/03/2016 2:31 PM

## 2016-06-26 NOTE — Interval H&P Note (Signed)
History and Physical Interval Note:  06/26/2016 11:16 AM  Lori Larsen  has presented today for surgery, with the diagnosis of Gastric submucosal lesion  The various methods of treatment have been discussed with the patient and family. After consideration of risks, benefits and other options for treatment, the patient has consented to  Procedure(s): FULL UPPER ENDOSCOPIC ULTRASOUND (EUS) RADIAL (N/A) as a surgical intervention .  The patient's history has been reviewed, patient examined, no change in status, stable for surgery.  I have reviewed the patient's chart and labs.  Questions were answered to the patient's satisfaction.     Tillie Rung

## 2016-06-26 NOTE — Anesthesia Postprocedure Evaluation (Signed)
Anesthesia Post Note  Patient: Lori Larsen  Procedure(s) Performed: Procedure(s) (LRB): FULL UPPER ENDOSCOPIC ULTRASOUND (EUS) RADIAL (N/A)  Patient location during evaluation: PACU Anesthesia Type: General Level of consciousness: awake and alert and oriented Pain management: pain level controlled Vital Signs Assessment: post-procedure vital signs reviewed and stable Respiratory status: spontaneous breathing Cardiovascular status: blood pressure returned to baseline Anesthetic complications: no     Last Vitals:  Vitals:   06/26/16 1200 06/26/16 1210  BP: 127/83 118/80  Pulse: 90 78  Resp: 19 16  Temp:      Last Pain:  Vitals:   06/26/16 1143  TempSrc: Tympanic                 Vihaan Gloss

## 2016-06-26 NOTE — Transfer of Care (Signed)
Immediate Anesthesia Transfer of Care Note  Patient: Lori Larsen  Procedure(s) Performed: Procedure(s): FULL UPPER ENDOSCOPIC ULTRASOUND (EUS) RADIAL (N/A)  Patient Location: PACU  Anesthesia Type:General  Level of Consciousness: sedated  Airway & Oxygen Therapy: Patient Spontanous Breathing and Patient connected to nasal cannula oxygen  Post-op Assessment: Report given to RN and Post -op Vital signs reviewed and stable  Post vital signs: Reviewed and stable  Last Vitals:  Vitals:   06/26/16 1046 06/26/16 1143  BP: 121/80 95/69  Pulse: 80 95  Resp: 18 (!) 25  Temp: 36.5 C (!) 36.1 C    Last Pain:  Vitals:   06/26/16 1143  TempSrc: Tympanic         Complications: Patient re-intubated

## 2016-06-26 NOTE — Anesthesia Preprocedure Evaluation (Signed)
Anesthesia Evaluation  Patient identified by MRN, date of birth, ID band Patient awake    Reviewed: Allergy & Precautions, NPO status , Patient's Chart, lab work & pertinent test results  History of Anesthesia Complications Negative for: history of anesthetic complications  Airway Mallampati: III       Dental  (+) Edentulous Upper, Edentulous Lower   Pulmonary Current Smoker,    Pulmonary exam normal        Cardiovascular hypertension, Pt. on medications Normal cardiovascular exam     Neuro/Psych CVA (right-sided weakness, resolved; right sided numbness still present), Residual Symptoms    GI/Hepatic negative GI ROS, Neg liver ROS,   Endo/Other  diabetes, Well Controlled, Type 2, Oral Hypoglycemic Agents  Renal/GU negative Renal ROS     Musculoskeletal   Abdominal Normal abdominal exam  (+)   Peds  Hematology  (+) anemia ,   Anesthesia Other Findings   Reproductive/Obstetrics                             Anesthesia Physical  Anesthesia Plan  ASA: III  Anesthesia Plan: General   Post-op Pain Management:    Induction: Intravenous  Airway Management Planned: Nasal Cannula  Additional Equipment:   Intra-op Plan:   Post-operative Plan:   Informed Consent: I have reviewed the patients History and Physical, chart, labs and discussed the procedure including the risks, benefits and alternatives for the proposed anesthesia with the patient or authorized representative who has indicated his/her understanding and acceptance.     Plan Discussed with:   Anesthesia Plan Comments:         Anesthesia Quick Evaluation

## 2016-06-26 NOTE — Anesthesia Post-op Follow-up Note (Cosign Needed)
Anesthesia QCDR form completed.        

## 2016-06-27 ENCOUNTER — Encounter: Payer: Self-pay | Admitting: Internal Medicine

## 2016-07-01 NOTE — Progress Notes (Signed)
  Oncology Nurse Navigator Documentation EUS results routed to Dr. Wynona Luna. Navigator Location: [facility] (07/01/16 1000)   )Navigator Encounter Type: Letter/Fax/Email;Diagnostic Results (07/01/16 1000)                                                    Time Spent with Patient: 15 (07/01/16 1000)

## 2016-08-05 ENCOUNTER — Inpatient Hospital Stay: Payer: Managed Care, Other (non HMO) | Attending: Oncology

## 2016-08-05 DIAGNOSIS — D649 Anemia, unspecified: Secondary | ICD-10-CM | POA: Insufficient documentation

## 2016-08-05 LAB — CBC
HEMATOCRIT: 30.4 % — AB (ref 35.0–47.0)
HEMOGLOBIN: 10.6 g/dL — AB (ref 12.0–16.0)
MCH: 32 pg (ref 26.0–34.0)
MCHC: 34.8 g/dL (ref 32.0–36.0)
MCV: 91.9 fL (ref 80.0–100.0)
Platelets: 297 10*3/uL (ref 150–440)
RBC: 3.31 MIL/uL — ABNORMAL LOW (ref 3.80–5.20)
RDW: 13 % (ref 11.5–14.5)
WBC: 11.4 10*3/uL — ABNORMAL HIGH (ref 3.6–11.0)

## 2016-08-05 LAB — IRON AND TIBC
Iron: 40 ug/dL (ref 28–170)
Saturation Ratios: 15 % (ref 10.4–31.8)
TIBC: 270 ug/dL (ref 250–450)
UIBC: 230 ug/dL

## 2016-08-05 LAB — FERRITIN: FERRITIN: 128 ng/mL (ref 11–307)

## 2016-10-03 ENCOUNTER — Inpatient Hospital Stay: Payer: Managed Care, Other (non HMO) | Attending: Oncology | Admitting: Oncology

## 2016-10-03 ENCOUNTER — Inpatient Hospital Stay: Payer: Managed Care, Other (non HMO)

## 2016-10-03 VITALS — BP 127/84 | HR 87 | Temp 98.1°F | Resp 18 | Wt 150.1 lb

## 2016-10-03 DIAGNOSIS — R5383 Other fatigue: Secondary | ICD-10-CM | POA: Diagnosis not present

## 2016-10-03 DIAGNOSIS — I10 Essential (primary) hypertension: Secondary | ICD-10-CM | POA: Insufficient documentation

## 2016-10-03 DIAGNOSIS — Z8673 Personal history of transient ischemic attack (TIA), and cerebral infarction without residual deficits: Secondary | ICD-10-CM | POA: Diagnosis not present

## 2016-10-03 DIAGNOSIS — E119 Type 2 diabetes mellitus without complications: Secondary | ICD-10-CM | POA: Insufficient documentation

## 2016-10-03 DIAGNOSIS — Z86010 Personal history of colonic polyps: Secondary | ICD-10-CM | POA: Diagnosis not present

## 2016-10-03 DIAGNOSIS — Z79899 Other long term (current) drug therapy: Secondary | ICD-10-CM | POA: Diagnosis not present

## 2016-10-03 DIAGNOSIS — F17210 Nicotine dependence, cigarettes, uncomplicated: Secondary | ICD-10-CM | POA: Insufficient documentation

## 2016-10-03 DIAGNOSIS — D649 Anemia, unspecified: Secondary | ICD-10-CM

## 2016-10-03 DIAGNOSIS — Z7984 Long term (current) use of oral hypoglycemic drugs: Secondary | ICD-10-CM | POA: Insufficient documentation

## 2016-10-03 DIAGNOSIS — Z7982 Long term (current) use of aspirin: Secondary | ICD-10-CM | POA: Insufficient documentation

## 2016-10-03 LAB — CBC
HEMATOCRIT: 31.5 % — AB (ref 35.0–47.0)
HEMOGLOBIN: 10.9 g/dL — AB (ref 12.0–16.0)
MCH: 30.9 pg (ref 26.0–34.0)
MCHC: 34.5 g/dL (ref 32.0–36.0)
MCV: 89.6 fL (ref 80.0–100.0)
Platelets: 286 10*3/uL (ref 150–440)
RBC: 3.51 MIL/uL — AB (ref 3.80–5.20)
RDW: 13.7 % (ref 11.5–14.5)
WBC: 11.4 10*3/uL — AB (ref 3.6–11.0)

## 2016-10-03 LAB — FERRITIN: FERRITIN: 134 ng/mL (ref 11–307)

## 2016-10-03 LAB — IRON AND TIBC
Iron: 72 ug/dL (ref 28–170)
Saturation Ratios: 26 % (ref 10.4–31.8)
TIBC: 277 ug/dL (ref 250–450)
UIBC: 205 ug/dL

## 2016-10-03 NOTE — Progress Notes (Signed)
Hematology/Oncology Consult note [facility]  Telephone:([phone] Fax:([phone]  Patient Care Team: McLean-Scocuzza, Nino Glow, MD as PCP - General (Internal Medicine)   Name of the patient: Lori Larsen  [id number]  June 15, 1965   Date of visit: 10/03/16  Diagnosis- normocytic anemia of unclear etiology  Chief complaint/ Reason for visit- routine f/u  Heme/Onc history: patient is a 51 year old female with a past medical history significant for hypertension and diabetes presented to the ER this morning after she passed out at work on 03/18/16. Patient reports symptoms of fatigue and feeling tired over the last couple of weeks prior to that. Patient denied any history of bright red blood in stool or dark tarry stoolsdenies any gum bleeds, nosebleeds and she has not thrown up or coughed up blood. No h/o vaginal bleeding Patient had a stool guaiac in the ER which was negative. CBC done in the ER showed white count of 6.3, H&H of 6.9/20.1 and a platelet count of 154. On looking at her prior CBCs from 2016 she was known to have some anemia with a hemoglobin between 8.6-10.6. Her prior CBC from 10/03/2015 revealed H&H of 10.9/32.9.   Ferritin was 105 with normal iron studies. Folate was normal at 22.4. Reticulocyte count was 1% (inappropriately low for the degree of anemia). CMP was normal except for a blood sugar of 206 with a normal total protein and normal total bilirubin. CT head and CT cervical spine did not reveal any acute pathology.Denies any family history of colon cancer  Patient was seen by GI who recommended a complete workup including EGD and colonoscopy plus minus capsule endoscopy. EGD on 04/18/2016 showed normal duodenum which was biopsied for celiac disease. Normal esophagus. There was a medium sized submucosal mass with no bleeding or stigmata of recent bleeding noted in the gastric region. EUS showed benign lipoma  The results of blood work  from her anemia workup or as follows: Ferritin was normal at 105. Iron studies were normal. LDH was normal at 127. Haptoglobin was normal and Coombs test was negative. Both And lambda free light chains were elevated but the ratio was normal at 1.42. SPEP did not reveal any monoclonal protein. B12 and folate levels were within normal limits. 24 hour urine protein revealed free kappaAnd lambda light chains and elevated Lambda ratio of 20.5. Immunofixation pattern in the protein was normal. Reticulocyte count was inappropriately low for the degree of anemia at 1%  Review of peripheral smear revealed adequate platelets. RBCs are normochromic and normocytic. Leukocyte count and differential within normal limits. And no abnormal or immature cells identified. Colonoscopy showed 2 polyps one in the sigmoid colon and the other in the hepatic flexure. Duodenal biopsy was negative for celiac disease. 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  Interval history- doing well. Denies any complaints  ECOG PS- 1 Pain scale- 0   Review of systems- Review of Systems  Constitutional: Negative for chills, fever, malaise/fatigue and weight loss.  HENT: Negative for congestion, ear discharge and nosebleeds.   Eyes: Negative for blurred vision.  Respiratory: Negative for cough, hemoptysis, sputum production, shortness of breath and wheezing.   Cardiovascular: Negative for chest pain, palpitations, orthopnea and claudication.  Gastrointestinal: Negative for abdominal pain, blood in stool, constipation, diarrhea, heartburn, melena, nausea and vomiting.  Genitourinary: Negative for dysuria, flank pain, frequency, hematuria and urgency.  Musculoskeletal: Negative for back pain, joint pain and myalgias.  Skin: Negative for  rash.  Neurological: Negative for dizziness, tingling, focal weakness, seizures, weakness and headaches.  Endo/Heme/Allergies: Does not bruise/bleed  easily.  Psychiatric/Behavioral: Negative for depression and suicidal ideas. The patient does not have insomnia.        Allergies  Allergen Reactions  . Metformin And Related Rash     Past Medical History:  Diagnosis Date  . Diabetes mellitus without complication ([place])   . Hypertension   . Stroke [facility])      Past Surgical History:  Procedure Laterality Date  . CESAREAN SECTION    . COLONOSCOPY WITH PROPOFOL N/A 04/18/2016   Procedure: COLONOSCOPY WITH PROPOFOL;  Surgeon: Jonathon Bellows, MD;  Location: [facility] ENDOSCOPY;  Service: Endoscopy;  Laterality: N/A;  . ESOPHAGOGASTRODUODENOSCOPY (EGD) WITH PROPOFOL N/A 04/18/2016   Procedure: ESOPHAGOGASTRODUODENOSCOPY (EGD) WITH PROPOFOL;  Surgeon: Jonathon Bellows, MD;  Location: [facility] ENDOSCOPY;  Service: Endoscopy;  Laterality: N/A;  . EUS N/A 06/26/2016   Procedure: FULL UPPER ENDOSCOPIC ULTRASOUND (EUS) RADIAL;  Surgeon: Holly Bodily, MD;  Location: [facility] ENDOSCOPY;  Service: Endoscopy;  Laterality: N/A;    Social History   Social History  . Marital status: Single    Spouse name: N/A  . Number of children: N/A  . Years of education: N/A   Occupational History  . works at [employer]  . Smoking status: Current Some Day Smoker    Packs/day: 0.50    Years: 20.00    Types: Cigarettes  . Smokeless tobacco: Never Used  . Alcohol use Yes     Comment:  occasionally drinks beer  . Drug use: No  . Sexual activity: Yes    Birth control/ protection: Post-menopausal   Other Topics Concern  . Not on file   Social History Narrative  . No narrative on file    Family History  Problem Relation Age of Onset  . Hypertension Mother   . Hypertension Father   . Breast cancer Neg Hx      Current Outpatient Prescriptions:  .  amLODipine (NORVASC) 2.5 MG tablet, Take 2.5 mg by mouth daily., Disp: , Rfl:  .  aspirin 325 MG tablet, Take 1 tablet (325 mg total) by mouth daily., Disp: 120 tablet, Rfl:  0 .  atorvastatin (LIPITOR) 40 MG tablet, Take 1 tablet (40 mg total) by mouth daily at 6 PM., Disp: 30 tablet, Rfl: 0 .  clopidogrel (PLAVIX) 75 MG tablet, Take 75 mg by mouth daily., Disp: , Rfl:  .  ferrous sulfate 325 (65 FE) MG tablet, Take 1 tablet by mouth 2 (two) times daily., Disp: , Rfl:  .  gabapentin (NEURONTIN) 100 MG capsule, Take 1 capsule by mouth 2 (two) times daily. , Disp: , Rfl:  .  hydrochlorothiazide (HYDRODIURIL) 25 MG tablet, Take 1 tablet by mouth daily., Disp: , Rfl:  .  JANUVIA 50 MG tablet, Take 50 mg by mouth daily. , Disp: , Rfl:  .  lisinopril (PRINIVIL,ZESTRIL) 40 MG tablet, Take 1 tablet by mouth every morning., Disp: , Rfl:  .  meclizine (ANTIVERT) 25 MG tablet, Take 1 tablet (25 mg total) by mouth 3 (three) times daily as needed for dizziness or nausea., Disp: 30 tablet, Rfl: 1 .  NOVOFINE PLUS 32G X 4 MM MISC, , Disp: , Rfl:  .  pantoprazole (PROTONIX) 40 MG tablet, Take 1 tablet (40 mg total) by mouth daily., Disp: 30 tablet, Rfl: 0 .  TRESIBA FLEXTOUCH 100 UNIT/ML SOPN FlexTouch Pen, Inject 24  Units into the skin every morning. , Disp: , Rfl: 2 .  glipiZIDE (GLUCOTROL) 5 MG tablet, Take 1 tablet (5 mg total) by mouth daily., Disp: 30 tablet, Rfl: 11 .  nicotine (NICODERM CQ - DOSED IN MG/24 HOURS) 21 mg/24hr patch, Place onto the skin., Disp: , Rfl:   Physical exam:  Vitals:   10/03/16 1116  BP: 127/84  Pulse: 87  Resp: 18  Temp: 98.1 F (36.7 C)  TempSrc: Tympanic  Weight: 150 lb 1.6 oz (68.1 kg)   Physical Exam  Constitutional: She is oriented to person, place, and time and well-developed, well-nourished, and in no distress.  HENT:  Head: Normocephalic and atraumatic.  Eyes: Pupils are equal, round, and reactive to light. EOM are normal.  Neck: Normal range of motion.  Cardiovascular: Normal rate, regular rhythm and normal heart sounds.   Pulmonary/Chest: Effort normal and breath sounds normal.  Abdominal: Soft. Bowel sounds are normal.   Neurological: She is alert and oriented to person, place, and time.  Skin: Skin is warm and dry.     CMP Latest Ref Rng & Units 03/19/2016  Glucose 65 - 99 mg/dL 236(H)  BUN 6 - 20 mg/dL 19  Creatinine 0.44 - 1.00 mg/dL 0.77  Sodium 135 - 145 mmol/L 136  Potassium 3.5 - 5.1 mmol/L 3.9  Chloride 101 - 111 mmol/L 107  CO2 22 - 32 mmol/L 23  Calcium 8.9 - 10.3 mg/dL 8.6(L)  Total Protein 6.5 - 8.1 g/dL -  Total Bilirubin 0.3 - 1.2 mg/dL -  Alkaline Phos 38 - 126 U/L -  AST 15 - 41 U/L -  ALT 14 - 54 U/L -   CBC Latest Ref Rng & Units 10/03/2016  WBC 3.6 - 11.0 K/uL 11.4(H)  Hemoglobin 12.0 - 16.0 g/dL 10.9(L)  Hematocrit 35.0 - 47.0 % 31.5(L)  Platelets 150 - 440 K/uL 286      Assessment and plan- Patient is a 51 y.o. female with normocytic anemia of unclear etiology  Patient has had extensive work up as above for her anemia and etiology remains uncertain. H/H stable around 10 for last few months. No associated other cytopenias. Hold off on bone marrow biopsy at this time. rtc in 6 months with cbc with diff. Interim cbc to be checked at 3 months   Visit Diagnosis 1. Normocytic anemia      Dr. Randa Evens, MD, MPH [facility] at [facility] Pager- [phone] 10/03/2016 12:43 PM

## 2016-10-03 NOTE — Progress Notes (Signed)
Patient does not offer any problems today.  

## 2017-01-05 ENCOUNTER — Inpatient Hospital Stay: Payer: Managed Care, Other (non HMO) | Attending: Oncology

## 2017-02-05 ENCOUNTER — Other Ambulatory Visit: Payer: Self-pay | Admitting: Internal Medicine

## 2017-02-05 DIAGNOSIS — Z1231 Encounter for screening mammogram for malignant neoplasm of breast: Secondary | ICD-10-CM

## 2017-03-04 ENCOUNTER — Ambulatory Visit
Admission: RE | Admit: 2017-03-04 | Discharge: 2017-03-04 | Disposition: A | Discharge status: Home / Self Care | Payer: Managed Care, Other (non HMO) | Source: Ambulatory Visit | Attending: Internal Medicine | Admitting: Internal Medicine

## 2017-03-04 DIAGNOSIS — Z1231 Encounter for screening mammogram for malignant neoplasm of breast: Secondary | ICD-10-CM | POA: Insufficient documentation

## 2017-04-07 ENCOUNTER — Inpatient Hospital Stay: Payer: Managed Care, Other (non HMO) | Admitting: Oncology

## 2017-04-07 ENCOUNTER — Telehealth: Payer: Self-pay | Admitting: Oncology

## 2017-04-07 ENCOUNTER — Inpatient Hospital Stay: Payer: Managed Care, Other (non HMO)

## 2017-04-07 NOTE — Telephone Encounter (Signed)
Lab/MD NoShow appts rschd. L/M on V/M.  Appt ltr mailed. MF

## 2017-04-07 NOTE — Progress Notes (Deleted)
Hematology/Oncology Consult note [facility]  [phone] Fax:([phone]  Patient Care Team: Perrin Maltese, MD as PCP - General (Internal Medicine)   Name of the patient: Lori Larsen  [id number]  12-26-1965   Date of visit: 04/07/17  Diagnosis- normocytic anemia of unclear etiology  Chief complaint/ Reason for visit- routine f/u of anemia  Heme/Onc history:patient is a 52 year old female with a past medical history significant for hypertension and diabetes presented to the ER this morning after she passed out at work on 03/18/16. Patient reports symptoms of fatigue and feeling tired over the last couple of weeks prior to that. Patient denied any history of bright red blood in stool or dark tarry stoolsdenies any gum bleeds, nosebleeds and she has not thrown up or coughed up blood. No h/o vaginal bleeding Patient had a stool guaiac in the ER which was negative. CBC done in the ER showed white count of 6.3, H&H of 6.9/20.1 and a platelet count of 154. On looking at her prior CBCs from 2016 she was known to have some anemia with a hemoglobin between 8.6-10.6. Her prior CBC from 10/03/2015 revealed H&H of 10.9/32.9.   Ferritin was 105 with normal iron studies. Folate was normal at 22.4. Reticulocyte count was 1% (inappropriately low for the degree of anemia). CMP was normal except for a blood sugar of 206 with a normal total protein and normal total bilirubin. CT head and CT cervical spine did not reveal any acute pathology.Denies any family history of colon cancer  Patient was seen by GI who recommended a complete workup including EGD and colonoscopy plus minus capsule endoscopy. EGD on 04/18/2016 showed normal duodenum which was biopsied for celiac disease. Normal esophagus. There was a medium sized submucosal mass with no bleeding or stigmata of recent bleeding noted in the gastric region. EUS showed benign lipoma. Colonoscopy showed non  bleeding hemorrhoids and 2 polyps which were taken out  The results of blood work from her anemia workup or as follows: Ferritin was normal at 105. Iron studies were normal. LDH was normal at 127. Haptoglobin was normal and Coombs test was negative. Both And lambda free light chains were elevated but the ratio was normal at 1.42. SPEP did not reveal any monoclonal protein. B12 and folate levels were within normal limits. 24 hour urine protein revealed free kappaAnd lambda light chains and elevated Lambda ratio of 20.5. Immunofixation pattern in the protein was normal. Reticulocyte count was inappropriately low for the degree of anemia at 1%  Review of peripheral smear revealed adequate platelets. RBCs are normochromic and normocytic. Leukocyte count and differential within normal limits. And no abnormal or immature cells identified.Colonoscopy showed 2 polyps one in the sigmoid colon and the other in the hepatic flexure. Duodenal biopsy was negative for celiac disease. 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     Interval history- ***  ECOG PS- *** Pain scale- *** Opioid associated constipation- ***  Review of systems- Review of Systems  Constitutional: Negative for chills, fever, malaise/fatigue and weight loss.  HENT: Negative for congestion, ear discharge and nosebleeds.   Eyes: Negative for blurred vision.  Respiratory: Negative for cough, hemoptysis, sputum production, shortness of breath and wheezing.   Cardiovascular: Negative for chest pain, palpitations, orthopnea and claudication.  Gastrointestinal: Negative for abdominal pain, blood in stool, constipation, diarrhea, heartburn, melena, nausea and vomiting.  Genitourinary: Negative for dysuria, flank pain, frequency, hematuria and urgency.  Musculoskeletal: Negative for back pain, joint pain and myalgias.  Skin: Negative for rash.  Neurological: Negative for dizziness,  tingling, focal weakness, seizures, weakness and headaches.  Endo/Heme/Allergies: Does not bruise/bleed easily.  Psychiatric/Behavioral: Negative for depression and suicidal ideas. The patient does not have insomnia.      Allergies  Allergen Reactions  . Metformin And Related Rash     Past Medical History:  Diagnosis Date  . Diabetes mellitus without complication ([place])   . Hypertension   . Stroke [facility])      Past Surgical History:  Procedure Laterality Date  . CESAREAN SECTION    . COLONOSCOPY WITH PROPOFOL N/A 04/18/2016   Procedure: COLONOSCOPY WITH PROPOFOL;  Surgeon: Jonathon Bellows, MD;  Location: [facility] ENDOSCOPY;  Service: Endoscopy;  Laterality: N/A;  . ESOPHAGOGASTRODUODENOSCOPY (EGD) WITH PROPOFOL N/A 04/18/2016   Procedure: ESOPHAGOGASTRODUODENOSCOPY (EGD) WITH PROPOFOL;  Surgeon: Jonathon Bellows, MD;  Location: [facility] ENDOSCOPY;  Service: Endoscopy;  Laterality: N/A;  . EUS N/A 06/26/2016   Procedure: FULL UPPER ENDOSCOPIC ULTRASOUND (EUS) RADIAL;  Surgeon: Holly Bodily, MD;  Location: [facility] ENDOSCOPY;  Service: Endoscopy;  Laterality: N/A;    Social History   Socioeconomic History  . Marital status: Single    Spouse name: Not on file  . Number of children: Not on file  . Years of education: Not on file  . Highest education level: Not on file  Social Needs  . Financial resource strain: Not on file  . Food insecurity - worry: Not on file  . Food insecurity - inability: Not on file  . Transportation needs - medical: Not on file  . Transportation needs - non-medical: Not on file  Occupational History  . Occupation: works at [employer]  . Smoking status: Current Some Day Smoker    Packs/day: 0.50    Years: 20.00    Pack years: 10.00    Types: Cigarettes  . Smokeless tobacco: Never Used  Substance and Sexual Activity  . Alcohol use: Yes    Comment:  occasionally drinks beer  . Drug use: No  . Sexual activity: Yes    Birth control/protection:  Post-menopausal  Other Topics Concern  . Not on file  Social History Narrative  . Not on file    Family History  Problem Relation Age of Onset  . Hypertension Mother   . Hypertension Father   . Breast cancer Neg Hx      Current Outpatient Medications:  .  amLODipine (NORVASC) 2.5 MG tablet, Take 2.5 mg by mouth daily., Disp: , Rfl:  .  aspirin 325 MG tablet, Take 1 tablet (325 mg total) by mouth daily., Disp: 120 tablet, Rfl: 0 .  atorvastatin (LIPITOR) 40 MG tablet, Take 1 tablet (40 mg total) by mouth daily at 6 PM., Disp: 30 tablet, Rfl: 0 .  clopidogrel (PLAVIX) 75 MG tablet, Take 75 mg by mouth daily., Disp: , Rfl:  .  ferrous sulfate 325 (65 FE) MG tablet, Take 1 tablet by mouth 2 (two) times daily., Disp: , Rfl:  .  gabapentin (NEURONTIN) 100 MG capsule, Take 1 capsule by mouth 2 (two) times daily. , Disp: , Rfl:  .  glipiZIDE (GLUCOTROL) 5 MG tablet, Take 1 tablet (5 mg total) by mouth daily., Disp: 30 tablet, Rfl: 11 .  hydrochlorothiazide (HYDRODIURIL) 25 MG tablet, Take 1 tablet by mouth daily., Disp: , Rfl:  .  JANUVIA 50 MG tablet, Take 50 mg by mouth daily. , Disp: , Rfl:  .  lisinopril (PRINIVIL,ZESTRIL) 40 MG tablet, Take 1 tablet by mouth every morning., Disp: , Rfl:  .  meclizine (ANTIVERT) 25 MG tablet, Take 1 tablet (25 mg total) by mouth 3 (three) times daily as needed for dizziness or nausea., Disp: 30 tablet, Rfl: 1 .  nicotine (NICODERM CQ - DOSED IN MG/24 HOURS) 21 mg/24hr patch, Place onto the skin., Disp: , Rfl:  .  NOVOFINE PLUS 32G X 4 MM MISC, , Disp: , Rfl:  .  pantoprazole (PROTONIX) 40 MG tablet, Take 1 tablet (40 mg total) by mouth daily., Disp: 30 tablet, Rfl: 0 .  TRESIBA FLEXTOUCH 100 UNIT/ML SOPN FlexTouch Pen, Inject 24 Units into the skin every morning. , Disp: , Rfl: 2  Physical exam: There were no vitals filed for this visit. Physical Exam  Constitutional: She is oriented to person, place, and time and well-developed, well-nourished, and in  no distress.  HENT:  Head: Normocephalic and atraumatic.  Eyes: EOM are normal. Pupils are equal, round, and reactive to light.  Neck: Normal range of motion.  Cardiovascular: Normal rate, regular rhythm and normal heart sounds.  Pulmonary/Chest: Effort normal and breath sounds normal.  Abdominal: Soft. Bowel sounds are normal.  Neurological: She is alert and oriented to person, place, and time.  Skin: Skin is warm and dry.     CMP Latest Ref Rng & Units 03/19/2016  Glucose 65 - 99 mg/dL 236(H)  BUN 6 - 20 mg/dL 19  Creatinine 0.44 - 1.00 mg/dL 0.77  Sodium 135 - 145 mmol/L 136  Potassium 3.5 - 5.1 mmol/L 3.9  Chloride 101 - 111 mmol/L 107  CO2 22 - 32 mmol/L 23  Calcium 8.9 - 10.3 mg/dL 8.6(L)  Total Protein 6.5 - 8.1 g/dL -  Total Bilirubin 0.3 - 1.2 mg/dL -  Alkaline Phos 38 - 126 U/L -  AST 15 - 41 U/L -  ALT 14 - 54 U/L -   CBC Latest Ref Rng & Units 10/03/2016  WBC 3.6 - 11.0 K/uL 11.4(H)  Hemoglobin 12.0 - 16.0 g/dL 10.9(L)  Hematocrit 35.0 - 47.0 % 31.5(L)  Platelets 150 - 440 K/uL 286     Assessment and plan- Patient is a 52 y.o. female with normocytic anemia of uncleare tiology   Visit Diagnosis No diagnosis found.   Dr. Randa Evens, MD, MPH [facility] at [facility] Pager- [phone] 04/07/2017 8:48 AM

## 2017-04-21 ENCOUNTER — Inpatient Hospital Stay (HOSPITAL_BASED_OUTPATIENT_CLINIC_OR_DEPARTMENT_OTHER): Payer: Managed Care, Other (non HMO) | Admitting: Oncology

## 2017-04-21 ENCOUNTER — Other Ambulatory Visit: Payer: Self-pay

## 2017-04-21 ENCOUNTER — Telehealth: Payer: Self-pay

## 2017-04-21 ENCOUNTER — Inpatient Hospital Stay: Payer: Managed Care, Other (non HMO) | Attending: Oncology

## 2017-04-21 VITALS — BP 89/62 | HR 74 | Temp 97.9°F | Resp 18 | Wt 147.3 lb

## 2017-04-21 DIAGNOSIS — D649 Anemia, unspecified: Secondary | ICD-10-CM | POA: Diagnosis not present

## 2017-04-21 DIAGNOSIS — I959 Hypotension, unspecified: Secondary | ICD-10-CM

## 2017-04-21 DIAGNOSIS — Z720 Tobacco use: Secondary | ICD-10-CM

## 2017-04-21 DIAGNOSIS — E119 Type 2 diabetes mellitus without complications: Secondary | ICD-10-CM | POA: Insufficient documentation

## 2017-04-21 LAB — CBC WITH DIFFERENTIAL/PLATELET
BASOS ABS: 0.1 10*3/uL (ref 0–0.1)
Basophils Relative: 1 %
Eosinophils Absolute: 0.3 10*3/uL (ref 0–0.7)
Eosinophils Relative: 2 %
HCT: 33.6 % — ABNORMAL LOW (ref 35.0–47.0)
Hemoglobin: 11.2 g/dL — ABNORMAL LOW (ref 12.0–16.0)
LYMPHS PCT: 38 %
Lymphs Abs: 4.9 10*3/uL — ABNORMAL HIGH (ref 1.0–3.6)
MCH: 29.9 pg (ref 26.0–34.0)
MCHC: 33.3 g/dL (ref 32.0–36.0)
MCV: 89.7 fL (ref 80.0–100.0)
Monocytes Absolute: 1.1 10*3/uL — ABNORMAL HIGH (ref 0.2–0.9)
Monocytes Relative: 8 %
NEUTROS ABS: 6.5 10*3/uL (ref 1.4–6.5)
Neutrophils Relative %: 51 %
Platelets: 319 10*3/uL (ref 150–440)
RBC: 3.74 MIL/uL — AB (ref 3.80–5.20)
RDW: 13.8 % (ref 11.5–14.5)
WBC: 12.9 10*3/uL — ABNORMAL HIGH (ref 3.6–11.0)

## 2017-04-21 NOTE — Telephone Encounter (Signed)
Call made to PCP DR N Khan-per Dr Janese Banks request. Called [phone] and spoke w Olin Hauser who stated she will route message to Dr Trish Mage NP -that BP 89/62 -pt felt light headed this am denies currently at [facility] Stated she will inform NP today

## 2017-04-21 NOTE — Progress Notes (Signed)
Here for follow up. BP 89/62 -pt stated she felt light headed this am but not now -fluids given.

## 2017-04-23 ENCOUNTER — Encounter: Payer: Self-pay | Admitting: Oncology

## 2017-04-23 NOTE — Progress Notes (Signed)
Hematology/Oncology Consult note [facility]  [phone] [phone]  Patient Care Team: Perrin Maltese, MD as PCP - General (Internal Medicine)   Name of the patient: Kimberlly Norgard  [id number]  27-Apr-1965   Date of visit: 04/23/17  Diagnosis- normocytic anemia of unclear etiology  Chief complaint/ Reason for visit- routine f/u of anemia  Heme/Onc history:patient is a 52 year old female with a past medical history significant for hypertension and diabetes presented to the ER this morning after she passed out at work on 03/18/16. Patient reports symptoms of fatigue and feeling tired over the last couple of weeks prior to that. Patient denied any history of bright red blood in stool or dark tarry stoolsdenies any gum bleeds, nosebleeds and she has not thrown up or coughed up blood. No h/o vaginal bleeding Patient had a stool guaiac in the ER which was negative. CBC done in the ER showed white count of 6.3, H&H of 6.9/20.1 and a platelet count of 154. On looking at her prior CBCs from 2016 she was known to have some anemia with a hemoglobin between 8.6-10.6. Her prior CBC from 10/03/2015 revealed H&H of 10.9/32.9.   Ferritin was 105 with normal iron studies. Folate was normal at 22.4. Reticulocyte count was 1% (inappropriately low for the degree of anemia). CMP was normal except for a blood sugar of 206 with a normal total protein and normal total bilirubin. CT head and CT cervical spine did not reveal any acute pathology.Denies any family history of colon cancer  Patient was seen by GI who recommended a complete workup including EGD and colonoscopy plus minus capsule endoscopy. EGD on 04/18/2016 showed normal duodenum which was biopsied for celiac disease. Normal esophagus. There was a medium sized submucosal mass with no bleeding or stigmata of recent bleeding noted in the gastric region. EUS showed benign lipoma  The results of blood work  from her anemia workup or as follows: Ferritin was normal at 105. Iron studies were normal. LDH was normal at 127. Haptoglobin was normal and Coombs test was negative. Both And lambda free light chains were elevated but the ratio was normal at 1.42. SPEP did not reveal any monoclonal protein. B12 and folate levels were within normal limits. 24 hour urine protein revealed free kappaAnd lambda light chains and elevated Lambda ratio of 20.5. Immunofixation pattern in the protein was normal. Reticulocyte count was inappropriately low for the degree of anemia at 1%  Review of peripheral smear revealed adequate platelets. RBCs are normochromic and normocytic. Leukocyte count and differential within normal limits. And no abnormal or immature cells identified.Colonoscopy showed 2 polyps one in the sigmoid colon and the other in the hepatic flexure. Duodenal biopsy was negative for celiac disease. 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     Interval history- she feels well. Denies any fatigue. Denies any blood loss in stool or urine  ECOG PS- 0 Pain scale- 0   Review of systems- Review of Systems  Constitutional: Negative for chills, fever, malaise/fatigue and weight loss.  HENT: Negative for congestion, ear discharge and nosebleeds.   Eyes: Negative for blurred vision.  Respiratory: Negative for cough, hemoptysis, sputum production, shortness of breath and wheezing.   Cardiovascular: Negative for chest pain, palpitations, orthopnea and claudication.  Gastrointestinal: Negative for abdominal pain, blood in stool, constipation, diarrhea, heartburn, melena, nausea and vomiting.  Genitourinary: Negative for dysuria, flank pain, frequency, hematuria and urgency.  Musculoskeletal:  Negative for back pain, joint pain and myalgias.  Skin: Negative for rash.  Neurological: Negative for dizziness, tingling, focal weakness, seizures, weakness and  headaches.  Endo/Heme/Allergies: Does not bruise/bleed easily.  Psychiatric/Behavioral: Negative for depression and suicidal ideas. The patient does not have insomnia.       Allergies  Allergen Reactions  . Metformin And Related Rash     Past Medical History:  Diagnosis Date  . Diabetes mellitus without complication ([place])   . Hypertension   . Stroke [facility])      Past Surgical History:  Procedure Laterality Date  . CESAREAN SECTION    . COLONOSCOPY WITH PROPOFOL N/A 04/18/2016   Procedure: COLONOSCOPY WITH PROPOFOL;  Surgeon: Jonathon Bellows, MD;  Location: [facility] ENDOSCOPY;  Service: Endoscopy;  Laterality: N/A;  . ESOPHAGOGASTRODUODENOSCOPY (EGD) WITH PROPOFOL N/A 04/18/2016   Procedure: ESOPHAGOGASTRODUODENOSCOPY (EGD) WITH PROPOFOL;  Surgeon: Jonathon Bellows, MD;  Location: [facility] ENDOSCOPY;  Service: Endoscopy;  Laterality: N/A;  . EUS N/A 06/26/2016   Procedure: FULL UPPER ENDOSCOPIC ULTRASOUND (EUS) RADIAL;  Surgeon: Holly Bodily, MD;  Location: [facility] ENDOSCOPY;  Service: Endoscopy;  Laterality: N/A;    Social History   Socioeconomic History  . Marital status: Single    Spouse name: Not on file  . Number of children: Not on file  . Years of education: Not on file  . Highest education level: Not on file  Social Needs  . Financial resource strain: Not on file  . Food insecurity - worry: Not on file  . Food insecurity - inability: Not on file  . Transportation needs - medical: Not on file  . Transportation needs - non-medical: Not on file  Occupational History  . Occupation: works at [employer]  . Smoking status: Current Some Day Smoker    Packs/day: 0.50    Years: 20.00    Pack years: 10.00    Types: Cigarettes  . Smokeless tobacco: Never Used  Substance and Sexual Activity  . Alcohol use: Yes    Comment:  occasionally drinks beer  . Drug use: No  . Sexual activity: Yes    Birth control/protection: Post-menopausal  Other Topics Concern  . Not on  file  Social History Narrative  . Not on file    Family History  Problem Relation Age of Onset  . Hypertension Mother   . Hypertension Father   . Breast cancer Neg Hx      Current Outpatient Medications:  .  amLODipine (NORVASC) 2.5 MG tablet, Take 2.5 mg by mouth daily., Disp: , Rfl:  .  aspirin 325 MG tablet, Take 1 tablet (325 mg total) by mouth daily., Disp: 120 tablet, Rfl: 0 .  atorvastatin (LIPITOR) 40 MG tablet, Take 1 tablet (40 mg total) by mouth daily at 6 PM., Disp: 30 tablet, Rfl: 0 .  clopidogrel (PLAVIX) 75 MG tablet, Take 75 mg by mouth daily., Disp: , Rfl:  .  ferrous sulfate 325 (65 FE) MG tablet, Take 1 tablet by mouth 2 (two) times daily., Disp: , Rfl:  .  glipiZIDE (GLUCOTROL) 5 MG tablet, Take 5 mg by mouth 2 (two) times daily., Disp: , Rfl: 2 .  hydrochlorothiazide (HYDRODIURIL) 25 MG tablet, Take 1 tablet by mouth daily., Disp: , Rfl:  .  lisinopril (PRINIVIL,ZESTRIL) 40 MG tablet, Take 1 tablet by mouth every morning., Disp: , Rfl:  .  NOVOFINE PLUS 32G X 4 MM MISC, , Disp: , Rfl:  .  TRESIBA FLEXTOUCH 100 UNIT/ML  SOPN FlexTouch Pen, Inject 24 Units into the skin every morning. , Disp: , Rfl: 2 .  gabapentin (NEURONTIN) 100 MG capsule, Take 1 capsule by mouth 2 (two) times daily. , Disp: , Rfl:  .  glipiZIDE (GLUCOTROL) 5 MG tablet, Take 1 tablet (5 mg total) by mouth daily., Disp: 30 tablet, Rfl: 11 .  meclizine (ANTIVERT) 25 MG tablet, Take 1 tablet (25 mg total) by mouth 3 (three) times daily as needed for dizziness or nausea. (Patient not taking: Reported on 04/21/2017), Disp: 30 tablet, Rfl: 1 .  nicotine (NICODERM CQ - DOSED IN MG/24 HOURS) 21 mg/24hr patch, Place onto the skin., Disp: , Rfl:  .  pantoprazole (PROTONIX) 40 MG tablet, Take 1 tablet (40 mg total) by mouth daily. (Patient not taking: Reported on 04/21/2017), Disp: 30 tablet, Rfl: 0  Physical exam:  Vitals:   04/21/17 1431  BP: (!) 89/62  Pulse: 74  Resp: 18  Temp: 97.9 F (36.6 C)    TempSrc: Tympanic  Weight: 147 lb 4.8 oz (66.8 kg)   Physical Exam  Constitutional: She is oriented to person, place, and time and well-developed, well-nourished, and in no distress.  HENT:  Head: Normocephalic and atraumatic.  Eyes: EOM are normal. Pupils are equal, round, and reactive to light.  Neck: Normal range of motion.  Cardiovascular: Normal rate, regular rhythm and normal heart sounds.  Pulmonary/Chest: Effort normal and breath sounds normal.  Abdominal: Soft. Bowel sounds are normal.  Neurological: She is alert and oriented to person, place, and time.  Skin: Skin is warm and dry.     CMP Latest Ref Rng & Units 03/19/2016  Glucose 65 - 99 mg/dL 236(H)  BUN 6 - 20 mg/dL 19  Creatinine 0.44 - 1.00 mg/dL 0.77  Sodium 135 - 145 mmol/L 136  Potassium 3.5 - 5.1 mmol/L 3.9  Chloride 101 - 111 mmol/L 107  CO2 22 - 32 mmol/L 23  Calcium 8.9 - 10.3 mg/dL 8.6(L)  Total Protein 6.5 - 8.1 g/dL -  Total Bilirubin 0.3 - 1.2 mg/dL -  Alkaline Phos 38 - 126 U/L -  AST 15 - 41 U/L -  ALT 14 - 54 U/L -   CBC Latest Ref Rng & Units 04/21/2017  WBC 3.6 - 11.0 K/uL 12.9(H)  Hemoglobin 12.0 - 16.0 g/dL 11.2(L)  Hematocrit 35.0 - 47.0 % 33.6(L)  Platelets 150 - 440 K/uL 319    Assessment and plan- Patient is a 52 y.o. female with normocytic anemia of unclear etiology  Patient continues to have moderate normocytic anemia with a hemoglobin between 10-11 for the past 1 year.  Anemia workup has been otherwise negative.  She has not had a bone marrow biopsy yet and I would like to hold that off at this point given stability of her counts.  Repeat CBC with differential in 6 months in 1 year and I will see her back in 1 years time  Hypotension: patient is asymptomatic but is on 3 different HTN meds. Will inform pcp   Visit Diagnosis 1. Normocytic anemia      Dr. Randa Evens, MD, MPH [facility] Pager- [phone] 04/23/2017 10:34 AM

## 2017-04-24 ENCOUNTER — Other Ambulatory Visit: Payer: Self-pay | Admitting: *Deleted

## 2017-10-20 ENCOUNTER — Inpatient Hospital Stay: Payer: Managed Care, Other (non HMO) | Attending: Oncology

## 2017-10-20 DIAGNOSIS — D649 Anemia, unspecified: Secondary | ICD-10-CM

## 2017-10-20 LAB — CBC WITH DIFFERENTIAL/PLATELET
BASOS ABS: 0.1 10*3/uL (ref 0–0.1)
BASOS PCT: 1 %
EOS ABS: 0.4 10*3/uL (ref 0–0.7)
Eosinophils Relative: 4 %
HEMATOCRIT: 34.4 % — AB (ref 35.0–47.0)
HEMOGLOBIN: 11.9 g/dL — AB (ref 12.0–16.0)
Lymphocytes Relative: 35 %
Lymphs Abs: 3.7 10*3/uL — ABNORMAL HIGH (ref 1.0–3.6)
MCH: 31.3 pg (ref 26.0–34.0)
MCHC: 34.7 g/dL (ref 32.0–36.0)
MCV: 90.1 fL (ref 80.0–100.0)
Monocytes Absolute: 0.8 10*3/uL (ref 0.2–0.9)
Monocytes Relative: 7 %
NEUTROS ABS: 5.6 10*3/uL (ref 1.4–6.5)
NEUTROS PCT: 53 %
Platelets: 299 10*3/uL (ref 150–440)
RBC: 3.82 MIL/uL (ref 3.80–5.20)
RDW: 13.5 % (ref 11.5–14.5)
WBC: 10.6 10*3/uL (ref 3.6–11.0)

## 2018-02-16 ENCOUNTER — Other Ambulatory Visit: Payer: Self-pay | Admitting: Internal Medicine

## 2018-02-16 DIAGNOSIS — Z1231 Encounter for screening mammogram for malignant neoplasm of breast: Secondary | ICD-10-CM

## 2018-03-05 ENCOUNTER — Ambulatory Visit
Admission: RE | Admit: 2018-03-05 | Discharge: 2018-03-05 | Disposition: A | Discharge status: Home / Self Care | Payer: Managed Care, Other (non HMO) | Source: Ambulatory Visit | Attending: Internal Medicine | Admitting: Internal Medicine

## 2018-03-05 DIAGNOSIS — Z1231 Encounter for screening mammogram for malignant neoplasm of breast: Secondary | ICD-10-CM | POA: Insufficient documentation

## 2018-04-07 IMAGING — MR MR HEAD W/O CM
10 of 11 series · 35 of 48 positions shown · non-contrast
Comparison: CT head 10/03/2015

CLINICAL DATA: Right-sided numbness and tingling. Diabetes and
hypertension

EXAM:
MRI HEAD WITHOUT CONTRAST
MRA HEAD WITHOUT CONTRAST
TECHNIQUE: Multiplanar, multiecho pulse sequences of the brain and surrounding
structures were obtained without intravenous contrast. Angiographic
images of the head were obtained using MRA technique without
contrast.

[Series 2: T1 · sagittal · 5.0mm · 0.45mm/px · 4 of 28 slices shown (1 of 2)]
[im 1/28]
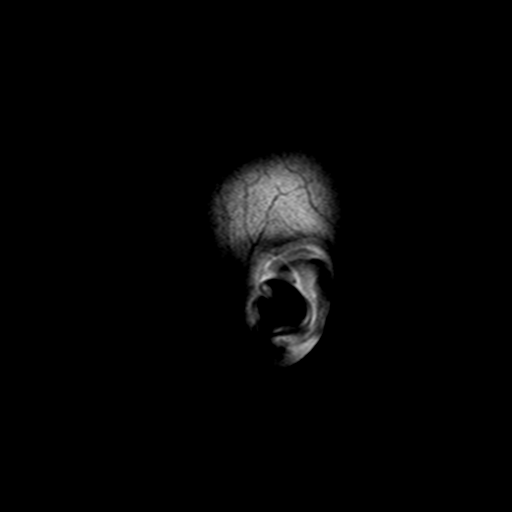
[im 10/28]
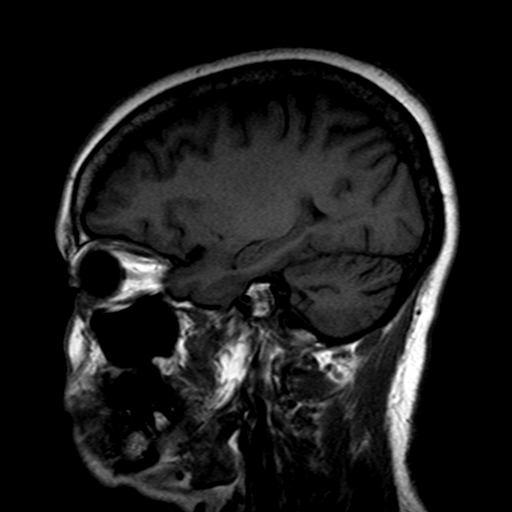
[im 19/28]
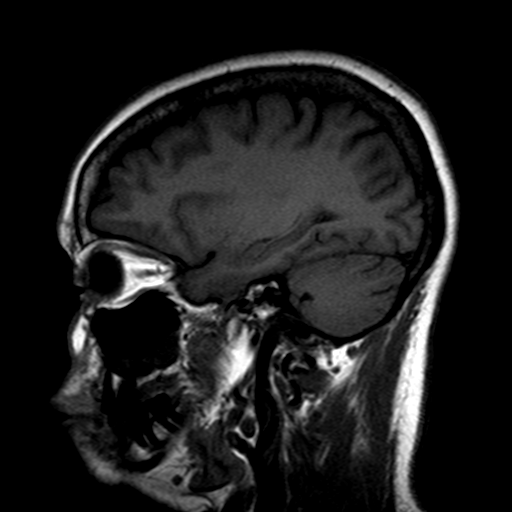
[im 28/28]
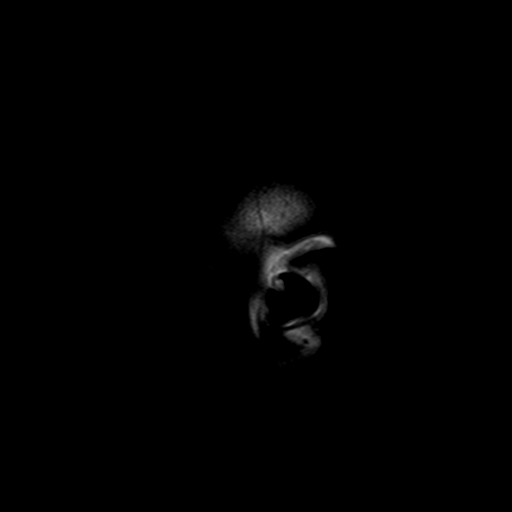

[Series 4: DWI · axial · 4.0mm · 0.94mm/px · z∈[-99,+68]mm · 5 of 43 slices shown (1 of 4)]
[im 1/43]
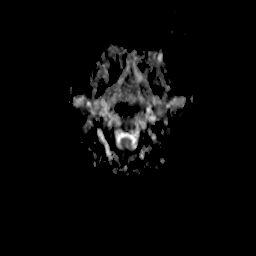
[im 11/43]
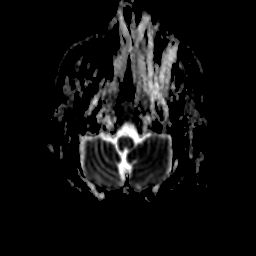
[im 22/43]
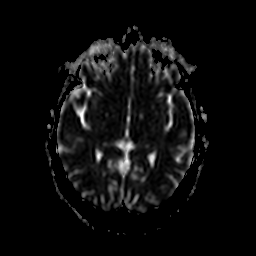
[im 32/43]
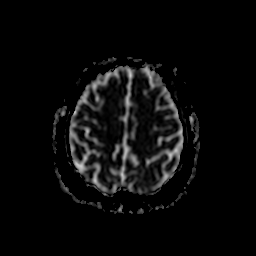
[im 43/43]
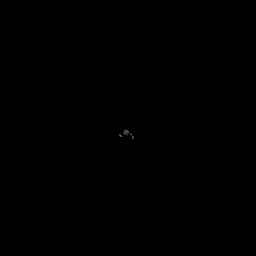

[Series 6: DWI · coronal · 5.0mm · 1.80mm/px · 4 of 37 slices shown (2 of 4)]
[im 1/37]
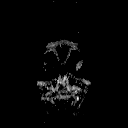
[im 13/37]
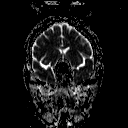
[im 25/37]
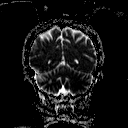
[im 37/37]
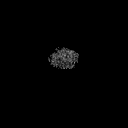

[Series 7: DWI · axial · 4.0mm · 0.94mm/px · z∈[-99,+68]mm · 4 of 43 slices shown (3 of 4)]
[im 1/43]
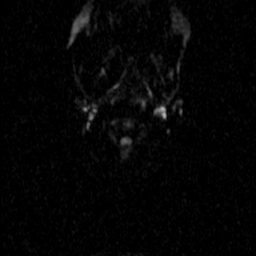
[im 15/43]
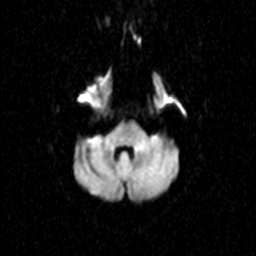
[im 29/43]
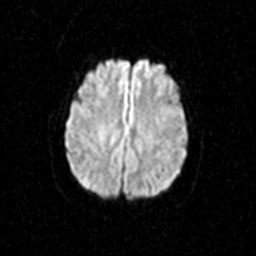
[im 43/43]
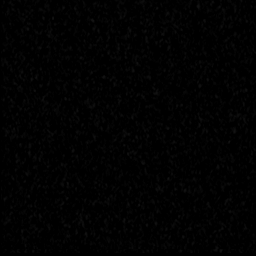

[Series 8: T2 · axial · 5.0mm · 0.45mm/px · z∈[-90,+64]mm · 2 of 25 slices shown (1 of 3)]
[im 1/25]
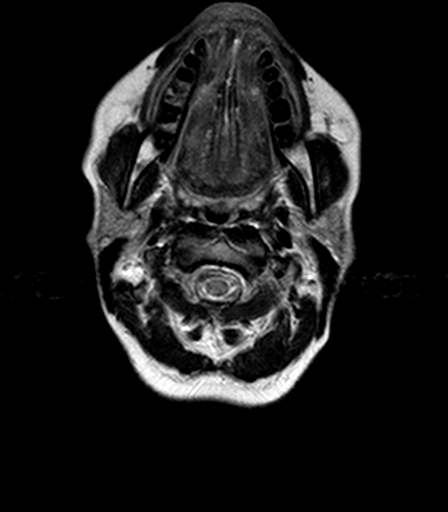
[im 25/25]
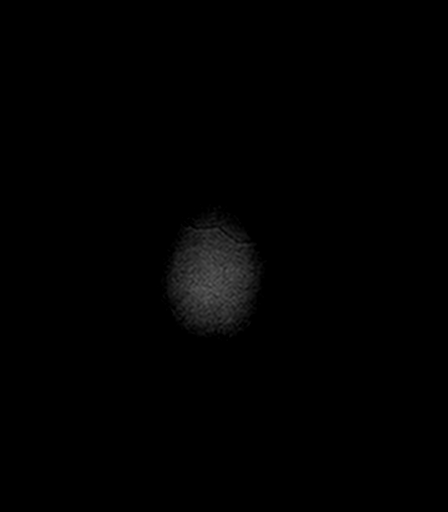

[Series 10: DWI · coronal · 5.0mm · 1.80mm/px · 3 of 35 slices shown (4 of 4)]
[im 1/35]
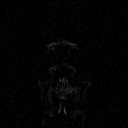
[im 18/35]
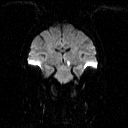
[im 35/35]
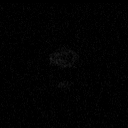

[Series 11: FLAIR · axial · 5.0mm · 0.90mm/px · z∈[-90,+64]mm · 2 of 25 slices shown]
[im 1/25]
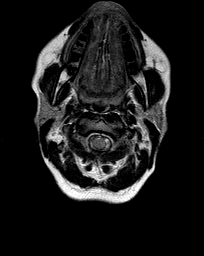
[im 25/25]
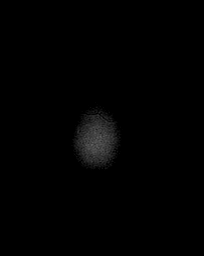

[Series 12: T2 · axial · 5.0mm · 0.45mm/px · z∈[-90,+64]mm · 2 of 25 slices shown (2 of 3)]
[im 1/25]
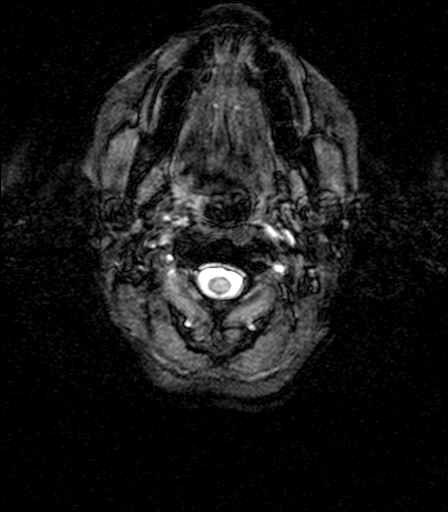
[im 25/25]
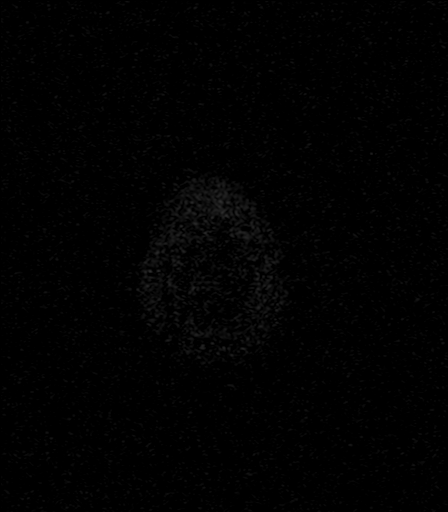

[Series 18: T1 · axial · 3.0mm · 0.45mm/px · z∈[-107,+81]mm · 6 of 64 slices shown (2 of 2)]
[im 1/64]
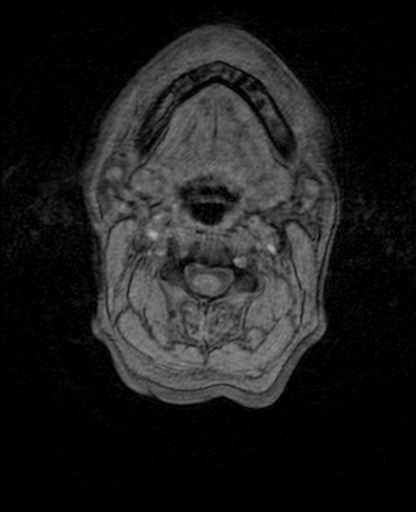
[im 13/64]
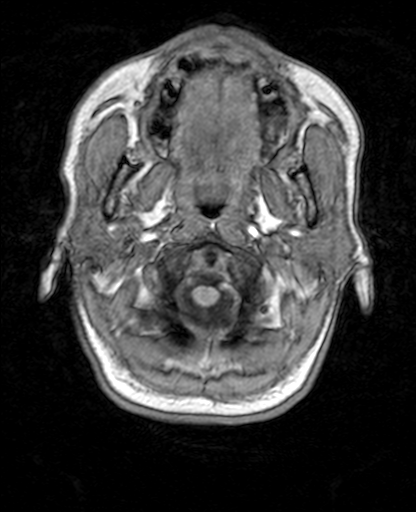
[im 26/64]
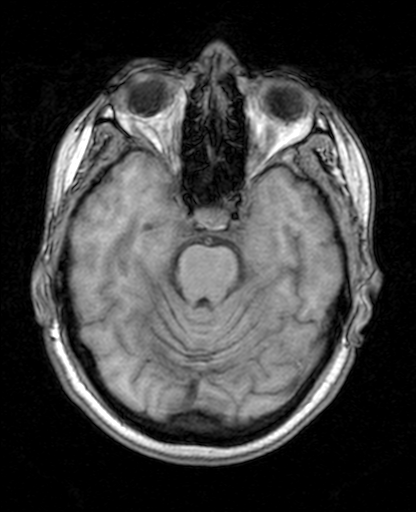
[im 38/64]
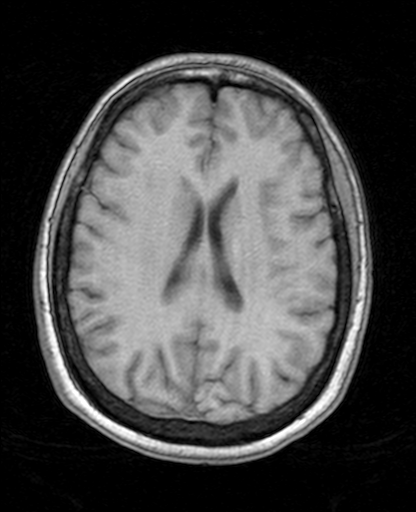
[im 51/64]
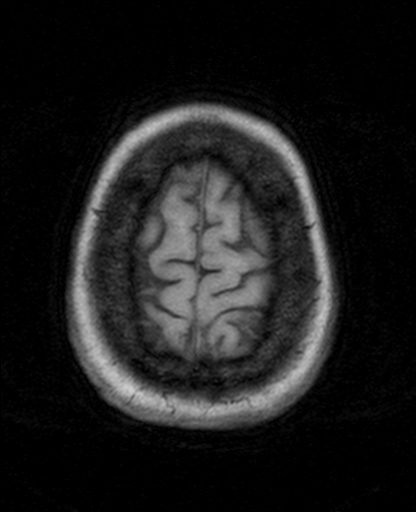
[im 64/64]
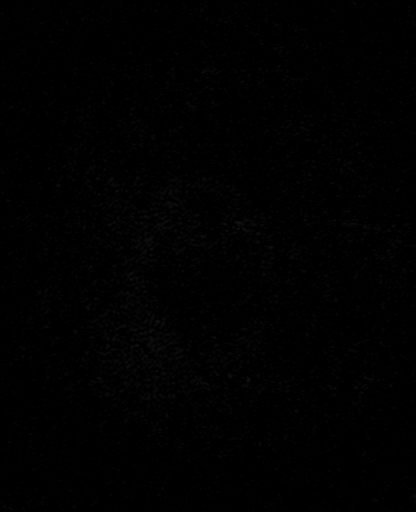

[Series 23: T2 · coronal · 5.0mm · 0.45mm/px · 3 of 29 slices shown (3 of 3)]
[im 1/29]
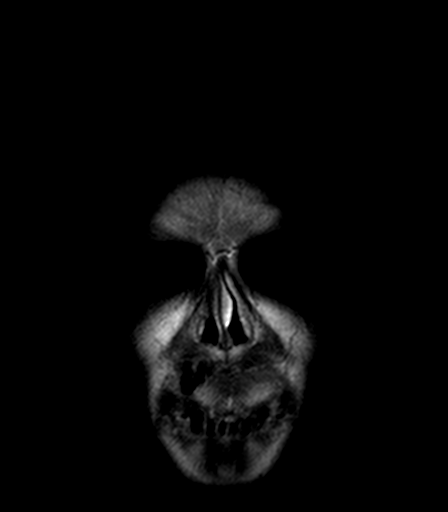
[im 15/29]
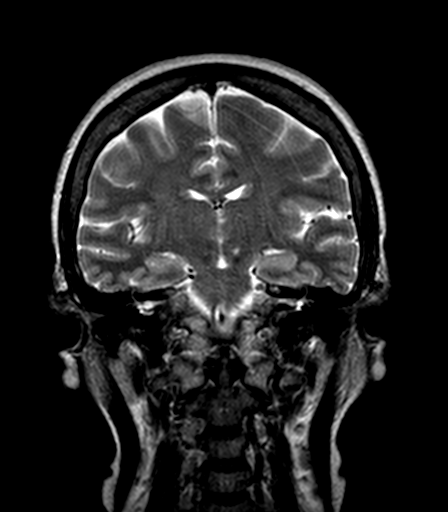
[im 29/29]
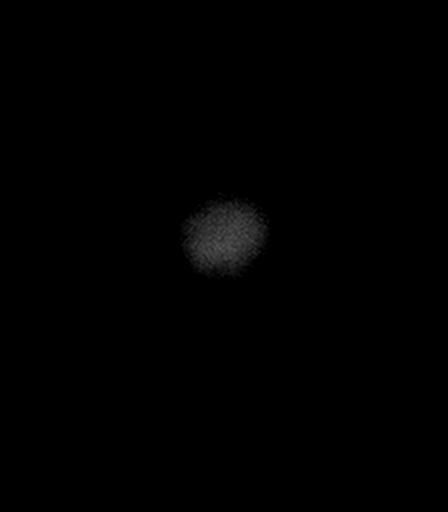

[35 of 48 positions shown; findings below may reference images not displayed]

FINDINGS: MRI HEAD FINDINGS

Acute infarct in the left medial thalamus extending inferiorly into
the left midbrain. No other areas of acute infarction. Chronic
infarct right cerebellum. Ventricle size is normal. Negative for
hydrocephalus. Cerebral volume is normal. Negative for intracranial
hemorrhage. Negative for mass or edema. No shift of the midline
structures.

Mucosal edema in the paranasal sinuses. No orbital lesion. Pituitary
not enlarged. Mastoid sinus clear bilaterally.

MRA HEAD FINDINGS

Diffuse intracranial atherosclerotic disease.

Moderate to severe stenosis distal right vertebral artery. Left
vertebral artery patent. PICA patent bilaterally. Basilar patent
with mild atherosclerotic stenosis. Severe stenosis in the left
posterior cerebral artery. Mild disease right posterior cerebral
artery.

Severe stenosis in the cavernous carotid bilaterally

Moderate stenosis right anterior cerebral artery near the anterior
communicating artery. Mild stenosis right M1 segment.

Left anterior cerebral artery and left middle cerebral arteries are
patent without significant stenosis.

Negative for cerebral aneurysm.
IMPRESSION: Acute infarct left medial thalamus extending into the midbrain.

Chronic infarct right cerebellum

Severe intracranial atherosclerotic disease. Severe stenosis
proximal left posterior cerebral artery accounts for the acute
infarct.

## 2018-04-22 ENCOUNTER — Inpatient Hospital Stay: Payer: Managed Care, Other (non HMO) | Attending: Oncology

## 2018-04-22 ENCOUNTER — Inpatient Hospital Stay: Payer: Managed Care, Other (non HMO) | Admitting: Oncology

## 2018-09-13 ENCOUNTER — Telehealth: Payer: Self-pay

## 2018-09-13 ENCOUNTER — Other Ambulatory Visit: Payer: Self-pay

## 2018-09-13 DIAGNOSIS — Z20822 Contact with and (suspected) exposure to covid-19: Secondary | ICD-10-CM

## 2018-09-13 NOTE — Telephone Encounter (Signed)
Incoming call from [employer] [facility] in [place].  Requesting that Patient be testing for Covid-19.  Phone call to Pt.  Patient Scheduled for tomorrow, 09/14/18 at 0830 am at [facility] location

## 2018-09-14 ENCOUNTER — Other Ambulatory Visit: Payer: Managed Care, Other (non HMO)

## 2018-09-19 LAB — NOVEL CORONAVIRUS, NAA: SARS-CoV-2, NAA: NOT DETECTED

## 2019-02-14 ENCOUNTER — Other Ambulatory Visit: Payer: Self-pay | Admitting: Internal Medicine

## 2019-02-14 DIAGNOSIS — Z1231 Encounter for screening mammogram for malignant neoplasm of breast: Secondary | ICD-10-CM

## 2019-03-08 ENCOUNTER — Ambulatory Visit
Admission: RE | Admit: 2019-03-08 | Discharge: 2019-03-08 | Disposition: A | Discharge status: Home / Self Care | Payer: 59 | Source: Ambulatory Visit | Attending: Internal Medicine | Admitting: Internal Medicine

## 2019-03-08 DIAGNOSIS — Z1231 Encounter for screening mammogram for malignant neoplasm of breast: Secondary | ICD-10-CM | POA: Diagnosis present

## 2019-05-15 ENCOUNTER — Emergency Department
Admission: EM | Admit: 2019-05-15 | Discharge: 2019-05-15 | Disposition: A | Discharge status: Home / Self Care | Payer: 59 | Attending: Emergency Medicine | Admitting: Emergency Medicine

## 2019-05-15 ENCOUNTER — Encounter: Payer: Self-pay | Admitting: Emergency Medicine

## 2019-05-15 ENCOUNTER — Other Ambulatory Visit: Payer: Self-pay

## 2019-05-15 ENCOUNTER — Emergency Department: Payer: 59

## 2019-05-15 DIAGNOSIS — Z79899 Other long term (current) drug therapy: Secondary | ICD-10-CM | POA: Diagnosis not present

## 2019-05-15 DIAGNOSIS — F17210 Nicotine dependence, cigarettes, uncomplicated: Secondary | ICD-10-CM | POA: Insufficient documentation

## 2019-05-15 DIAGNOSIS — G459 Transient cerebral ischemic attack, unspecified: Secondary | ICD-10-CM | POA: Diagnosis not present

## 2019-05-15 DIAGNOSIS — I10 Essential (primary) hypertension: Secondary | ICD-10-CM | POA: Insufficient documentation

## 2019-05-15 DIAGNOSIS — E119 Type 2 diabetes mellitus without complications: Secondary | ICD-10-CM | POA: Diagnosis not present

## 2019-05-15 DIAGNOSIS — R202 Paresthesia of skin: Secondary | ICD-10-CM | POA: Diagnosis not present

## 2019-05-15 DIAGNOSIS — Z7984 Long term (current) use of oral hypoglycemic drugs: Secondary | ICD-10-CM | POA: Insufficient documentation

## 2019-05-15 DIAGNOSIS — Z794 Long term (current) use of insulin: Secondary | ICD-10-CM

## 2019-05-15 DIAGNOSIS — R200 Anesthesia of skin: Secondary | ICD-10-CM | POA: Insufficient documentation

## 2019-05-15 LAB — PROTIME-INR
INR: 0.9 (ref 0.8–1.2)
Prothrombin Time: 11.9 seconds (ref 11.4–15.2)

## 2019-05-15 LAB — COMPREHENSIVE METABOLIC PANEL
ALT: 25 U/L (ref 0–44)
AST: 23 U/L (ref 15–41)
Albumin: 4.1 g/dL (ref 3.5–5.0)
Alkaline Phosphatase: 74 U/L (ref 38–126)
Anion gap: 6 (ref 5–15)
BUN: 17 mg/dL (ref 6–20)
CO2: 26 mmol/L (ref 22–32)
Calcium: 9.3 mg/dL (ref 8.9–10.3)
Chloride: 103 mmol/L (ref 98–111)
Creatinine, Ser: 0.9 mg/dL (ref 0.44–1.00)
GFR calc Af Amer: >60 mL/min (ref >60)
GFR calc non Af Amer: >60 mL/min (ref >60)
Glucose, Bld: 236 mg/dL — ABNORMAL HIGH (ref 70–99)
Potassium: 3.7 mmol/L (ref 3.5–5.1)
Sodium: 135 mmol/L (ref 135–145)
Total Bilirubin: 0.4 mg/dL (ref 0.3–1.2)
Total Protein: 7.8 g/dL (ref 6.5–8.1)

## 2019-05-15 LAB — DIFFERENTIAL
Abs Immature Granulocytes: 0.04 10*3/uL (ref 0.00–0.07)
Basophils Absolute: 0.1 10*3/uL (ref 0.0–0.1)
Basophils Relative: 1 %
Eosinophils Absolute: 0.3 10*3/uL (ref 0.0–0.5)
Eosinophils Relative: 3 %
Immature Granulocytes: 0 %
Lymphocytes Relative: 37 %
Lymphs Abs: 4.2 10*3/uL — ABNORMAL HIGH (ref 0.7–4.0)
Monocytes Absolute: 0.7 10*3/uL (ref 0.1–1.0)
Monocytes Relative: 6 %
Neutro Abs: 6 10*3/uL (ref 1.7–7.7)
Neutrophils Relative %: 53 %

## 2019-05-15 LAB — CBC
HCT: 33.9 % — ABNORMAL LOW (ref 36.0–46.0)
Hemoglobin: 11.2 g/dL — ABNORMAL LOW (ref 12.0–15.0)
MCH: 30.8 pg (ref 26.0–34.0)
MCHC: 33 g/dL (ref 30.0–36.0)
MCV: 93.1 fL (ref 80.0–100.0)
Platelets: 292 10*3/uL (ref 150–400)
RBC: 3.64 MIL/uL — ABNORMAL LOW (ref 3.87–5.11)
RDW: 12.7 % (ref 11.5–15.5)
WBC: 11.3 10*3/uL — ABNORMAL HIGH (ref 4.0–10.5)
nRBC: 0 % (ref 0.0–0.2)

## 2019-05-15 LAB — GLUCOSE, CAPILLARY
Glucose-Capillary: 222 mg/dL — ABNORMAL HIGH (ref 70–99)
Glucose-Capillary: 80 mg/dL (ref 70–99)

## 2019-05-15 LAB — APTT: aPTT: 34 seconds (ref 24–36)

## 2019-05-15 MED ORDER — SODIUM CHLORIDE 0.9% FLUSH: 3.0000 mL | Freq: Once | INTRAVENOUS | Status: DC

## 2019-05-15 NOTE — ED Triage Notes (Signed)
Pt presents to ED via POV with c/o sudden onset R arm and R leg numbness that started at approx 0900 today. Pt also c/o HTN and Hyperglycemia. Grip strength equal bilaterally, and facial symmetry intact, no slurred speech noted in triage upon arrival.

## 2019-05-15 NOTE — ED Triage Notes (Signed)
FIRST NURSE NOTE:   Pt states she woke up at 7am normal, states at 9am noticed right arm numbness and right leg numbness.  Pt states her blood sugar and blood pressure are elevated, states she has a hx of CVA.

## 2019-05-15 NOTE — ED Provider Notes (Signed)
[facility] Emergency Department Provider Note  ____________________________________________  Time seen: Approximately 5:46 PM  I have reviewed the triage vital signs and the nursing notes.   HISTORY  Chief Complaint Numbness    HPI RUTHELMA BESCH is a 54 y.o. female with a history of stroke hypertension and diabetes who comes the ED complaining of paresthesia of her right arm and right leg that started about 9:00 AM today.  She was in her usual state of health before had including when waking up this morning.  Denies any vision changes falls trauma or headache.  No neck pain.  No chest pain shortness of breath or palpitations.  She has been compliant with her medications.  Symptoms are constant, gradually resolved over the course of the afternoon.  Now feels back to normal.  Denies motor weakness.  Feels like her balance and coordination are normal.      Past Medical History:  Diagnosis Date  . Diabetes mellitus without complication ([place])   . Hypertension   . Stroke [facility])      Patient Active Problem List   Diagnosis Date Noted  . Dysfunctional or functional uterine hemorrhage 05/29/2016  . Fatigue 05/29/2016  . Gravida 0 05/29/2016  . Hypercholesterolemia 05/29/2016  . Immunization, tetanus toxoid 05/29/2016  . Received influenza vaccination at hospital 05/29/2016  . Burning or prickling sensation 05/29/2016  . Symptom associated with female genital organs 05/29/2016  . Pneumococcal vaccination given 05/29/2016  . Encounter for general adult medical examination without abnormal findings 05/29/2016  . Syncope 03/18/2016  . Anemia 03/18/2016  . CVA (cerebral vascular accident) ([place]) 11/21/2015  . CVA (cerebral infarction) 10/03/2015  . Avitaminosis D 05/17/2014  . Climacteric menorrhagia 08/05/2006  . Type 2 diabetes mellitus with other diabetic kidney complication ([place]) 123456  . Current tobacco use 04/02/2006  . Essential (primary)  hypertension 03/10/2004     Past Surgical History:  Procedure Laterality Date  . CESAREAN SECTION    . COLONOSCOPY WITH PROPOFOL N/A 04/18/2016   Procedure: COLONOSCOPY WITH PROPOFOL;  Surgeon: Jonathon Bellows, MD;  Location: [facility] ENDOSCOPY;  Service: Endoscopy;  Laterality: N/A;  . ESOPHAGOGASTRODUODENOSCOPY (EGD) WITH PROPOFOL N/A 04/18/2016   Procedure: ESOPHAGOGASTRODUODENOSCOPY (EGD) WITH PROPOFOL;  Surgeon: Jonathon Bellows, MD;  Location: [facility] ENDOSCOPY;  Service: Endoscopy;  Laterality: N/A;  . EUS N/A 06/26/2016   Procedure: FULL UPPER ENDOSCOPIC ULTRASOUND (EUS) RADIAL;  Surgeon: Holly Bodily, MD;  Location: [facility] ENDOSCOPY;  Service: Endoscopy;  Laterality: N/A;     Prior to Admission medications   Medication Sig Start Date End Date Taking? Authorizing Provider  amLODipine (NORVASC) 2.5 MG tablet Take 2.5 mg by mouth daily.    [provider]  aspirin 325 MG tablet Take 1 tablet (325 mg total) by mouth daily. 10/04/15   Bettey Costa, MD  atorvastatin (LIPITOR) 40 MG tablet Take 1 tablet (40 mg total) by mouth daily at 6 PM. 10/04/15   Bettey Costa, MD  clopidogrel (PLAVIX) 75 MG tablet Take 75 mg by mouth daily.    [provider]  ferrous sulfate 325 (65 FE) MG tablet Take 1 tablet by mouth 2 (two) times daily. 02/04/16   [provider]  gabapentin (NEURONTIN) 100 MG capsule Take 1 capsule by mouth 2 (two) times daily.  03/18/16   [provider]  glipiZIDE (GLUCOTROL) 5 MG tablet Take 1 tablet (5 mg total) by mouth daily. 09/27/15 09/26/16  Earleen Newport, MD  glipiZIDE (GLUCOTROL) 5 MG tablet Take 5  mg by mouth 2 (two) times daily. 04/06/17   [provider]  hydrochlorothiazide (HYDRODIURIL) 25 MG tablet Take 1 tablet by mouth daily. 12/25/15   [provider]  lisinopril (PRINIVIL,ZESTRIL) 40 MG tablet Take 1 tablet by mouth every morning. 02/04/16   [provider]  meclizine (ANTIVERT) 25 MG tablet Take 1 tablet (25 mg  total) by mouth 3 (three) times daily as needed for dizziness or nausea. Patient not taking: Reported on 04/21/2017 10/26/14   Earleen Newport, MD  nicotine (NICODERM CQ - DOSED IN MG/24 HOURS) 21 mg/24hr patch Place onto the skin. 10/04/15   [provider]  NOVOFINE PLUS 32G X 4 MM MISC  02/05/16   [provider]  pantoprazole (PROTONIX) 40 MG tablet Take 1 tablet (40 mg total) by mouth daily. Patient not taking: Reported on 04/21/2017 03/20/16   Epifanio Lesches, MD  TRESIBA FLEXTOUCH 100 UNIT/ML SOPN FlexTouch Pen Inject 24 Units into the skin every morning.  01/25/16   [provider]     Allergies Metformin and Metformin and related   Family History  Problem Relation Age of Onset  . Hypertension Mother   . Hypertension Father   . Breast cancer Neg Hx     Social History Social History   Tobacco Use  . Smoking status: Current Some Day Smoker    Packs/day: 0.50    Years: 20.00    Pack years: 10.00    Types: Cigarettes  . Smokeless tobacco: Never Used  Substance Use Topics  . Alcohol use: Yes    Comment:  occasionally drinks beer  . Drug use: No    Review of Systems  Constitutional:   No fever or chills.  ENT:   No sore throat. No rhinorrhea. Cardiovascular:   No chest pain or syncope. Respiratory:   No dyspnea or cough. Gastrointestinal:   Negative for abdominal pain, vomiting and diarrhea.  Musculoskeletal:   Negative for focal pain or swelling All other systems reviewed and are negative except as documented above in ROS and HPI.  ____________________________________________   PHYSICAL EXAM:  VITAL SIGNS: ED Triage Vitals  Enc Vitals Group     BP 05/15/19 1336 (!) 179/89     Pulse Rate 05/15/19 1336 87     Resp 05/15/19 1336 18     Temp 05/15/19 1336 98.6 F (37 C)     Temp Source 05/15/19 1336 Oral     SpO2 05/15/19 1336 99 %     Weight 05/15/19 1336 151 lb (68.5 kg)     Height 05/15/19 1336 5\' 3"  (1.6 m)     Head  Circumference --      Peak Flow --      Pain Score 05/15/19 1335 0     Pain Loc --      Pain Edu? --      Excl. in [place]? --     Vital signs reviewed, nursing assessments reviewed.   Constitutional:   Alert and oriented. Non-toxic appearance. Eyes:   Conjunctivae are normal. EOMI. PERRL. ENT      Head:   Normocephalic and atraumatic.      Nose:   Wearing a mask.      Mouth/Throat:   Wearing a mask.      Neck:   No meningismus. Full ROM. Hematological/Lymphatic/Immunilogical:   No cervical lymphadenopathy. Cardiovascular:   RRR. Symmetric bilateral radial and DP pulses.  No murmurs. Cap refill less than 2 seconds. Respiratory:   Normal respiratory  effort without tachypnea/retractions. Breath sounds are clear and equal bilaterally. No wheezes/rales/rhonchi. Gastrointestinal:   Soft and nontender. Non distended. There is no CVA tenderness.  No rebound, rigidity, or guarding. Genitourinary:   deferred Musculoskeletal:   Normal range of motion in all extremities. No joint effusions.  No lower extremity tenderness.  No edema. Neurologic:   Normal speech and language.  Cranial nerves III through XII intact No drift Normal gait No pronator drift.  Normal heel-to-shin. Motor grossly intact. Symmetric sensation in arms and legs. No acute focal neurologic deficits are appreciated.  NIH stroke scale 0 Skin:    Skin is warm, dry and intact. No rash noted.  No petechiae, purpura, or bullae.  ____________________________________________    LABS (pertinent positives/negatives) (all labs ordered are listed, but only abnormal results are displayed) Labs Reviewed  GLUCOSE, CAPILLARY - Abnormal; Notable for the following components:      Result Value   Glucose-Capillary 222 (*)    All other components within normal limits  CBC - Abnormal; Notable for the following components:   WBC 11.3 (*)    RBC 3.64 (*)    Hemoglobin 11.2 (*)    HCT 33.9 (*)    All other components within normal limits   DIFFERENTIAL - Abnormal; Notable for the following components:   Lymphs Abs 4.2 (*)    All other components within normal limits  COMPREHENSIVE METABOLIC PANEL - Abnormal; Notable for the following components:   Glucose, Bld 236 (*)    All other components within normal limits  PROTIME-INR  APTT  GLUCOSE, CAPILLARY  CBG MONITORING, ED   ____________________________________________   EKG  Interpreted by me Normal sinus rhythm rate of 88, normal axis intervals QRS ST segments.  Isolated T wave inversion in lead III which is nonspecific.  ____________________________________________    RADIOLOGY  CT HEAD WO CONTRAST  Result Date: 05/15/2019 CLINICAL DATA:  Pt presents to ED via POV with c/o sudden onset R arm and R leg numbness that started at approx 0900 today. Pt also c/o HTN and Hyperglycemia. Grip strength equal bilaterally, and facial symmetry intact, no slurred speech noted in triage upon arrival. EXAM: CT HEAD WITHOUT CONTRAST TECHNIQUE: Contiguous axial images were obtained from the base of the skull through the vertex without intravenous contrast. COMPARISON:  CT head 03/18/2016 FINDINGS: Brain: No evidence of acute infarction, hemorrhage, hydrocephalus, extra-axial collection or mass lesion/mass effect. Vascular: No hyperdense vessel or unexpected calcification. Skull: Normal. Negative for fracture or focal lesion. Sinuses/Orbits: Mild mucosal thickening in the bilateral sphenoid and ethmoid sinuses. Orbits are unremarkable. Other: None. IMPRESSION: 1. No acute intracranial pathology. 2. Mild mucosal thickening in the bilateral sphenoid and ethmoid sinuses. Electronically Signed   By: Audie Pinto M.D.   On: 05/15/2019 14:54    ____________________________________________   PROCEDURES Procedures  ____________________________________________  DIFFERENTIAL DIAGNOSIS   Intracranial hemorrhage, ischemic stroke, paresthesia  CLINICAL IMPRESSION / ASSESSMENT AND PLAN /  ED COURSE  Medications ordered in the ED: Medications  sodium chloride flush (NS) 0.9 % injection 3 mL (has no administration in time range)    Pertinent labs & imaging results that were available during my care of the patient were reviewed by me and considered in my medical decision making (see chart for details).  Lori Larsen was evaluated in Emergency Department on 05/15/2019 for the symptoms described in the history of present illness. She was evaluated in the context of the global COVID-19 pandemic, which necessitated consideration that the patient might  be at risk for infection with the SARS-CoV-2 virus that causes COVID-19. Institutional protocols and algorithms that pertain to the evaluation of patients at risk for COVID-19 are in a state of rapid change based on information released by regulatory bodies including the CDC and federal and state organizations. These policies and algorithms were followed during the patient's care in the ED.   Patient presents with symptoms of altered sensation in the right arm and right leg that started this morning at 9 AM.  On arrival to the treatment room, she has returned to normal with a normal neurologic exam, feeling asymptomatic.  Suspicious for TIA.  Initial labs and CT scan of the head are all unremarkable.  Engaged in shared decision-making with the patient, offered hospitalization for further stroke work-up versus MRI in the ED to help determine disposition versus discharge and follow-up with her primary care doctor for further stroke evaluation.  Patient prefers to be discharged and is comfortable waiting to see her doctor with whom she already has an appointment later this month.  Patient understands she should return to the hospital immediately if she has any recurrent or worsening symptoms.      ____________________________________________   FINAL CLINICAL IMPRESSION(S) / ED DIAGNOSES    Final diagnoses:  Paresthesia  TIA   ED  Discharge Orders    None      Portions of this note were generated with dragon dictation software. Dictation errors may occur despite best attempts at proofreading.   Carrie Mew, MD 05/15/19 [phone]

## 2019-05-15 NOTE — ED Notes (Signed)
Dr. Stafford at bedside.  

## 2019-06-09 ENCOUNTER — Ambulatory Visit: Payer: 59 | Attending: Internal Medicine

## 2019-06-09 DIAGNOSIS — Z23 Encounter for immunization: Secondary | ICD-10-CM

## 2019-06-09 NOTE — Progress Notes (Signed)
   Covid-19 Vaccination Clinic  Name:  Lori Larsen    MRN: [id number] DOB: 1965/09/13  06/09/2019  [name] was observed post Covid-19 immunization for 15 minutes without incident. She was provided with Vaccine Information Sheet and instruction to access the V-Safe system.   [name] was instructed to call 911 with any severe reactions post vaccine: Marland Kitchen Difficulty breathing  . Swelling of face and throat  . A fast heartbeat  . A bad rash all over body  . Dizziness and weakness   Immunizations Administered    Name Date Dose VIS Date Route   Pfizer COVID-19 Vaccine 06/09/2019  8:13 AM 0.3 mL 02/18/2019 Intramuscular   Manufacturer: [place]   Lot: [id number]   [place]: [id number]

## 2019-07-06 ENCOUNTER — Ambulatory Visit: Payer: 59 | Attending: Internal Medicine

## 2019-07-06 DIAGNOSIS — Z23 Encounter for immunization: Secondary | ICD-10-CM

## 2019-07-06 NOTE — Progress Notes (Signed)
   Covid-19 Vaccination Clinic  Name:  Lori Larsen    MRN: [id number] DOB: [birth year]  07/06/2019  Ms. Sweet was observed post Covid-19 immunization for 15 minutes without incident. She was provided with Vaccine Information Sheet and instruction to access the V-Safe system.   [name] was instructed to call 911 with any severe reactions post vaccine: Marland Kitchen Difficulty breathing  . Swelling of face and throat  . A fast heartbeat  . A bad rash all over body  . Dizziness and weakness   Immunizations Administered    Name Date Dose VIS Date Route   Pfizer COVID-19 Vaccine 07/06/2019  8:12 AM 0.3 mL 05/04/2018 Intramuscular   Manufacturer: [place]   Lot: [id number]   [place]: [id number]

## 2019-09-27 DIAGNOSIS — Z8673 Personal history of transient ischemic attack (TIA), and cerebral infarction without residual deficits: Secondary | ICD-10-CM

## 2019-09-27 HISTORY — DX: Personal history of transient ischemic attack (TIA), and cerebral infarction without residual deficits: Z86.73

## 2019-12-18 ENCOUNTER — Emergency Department
Admission: EM | Admit: 2019-12-18 | Discharge: 2019-12-18 | Disposition: A | Discharge status: Home / Self Care | Payer: 59 | Attending: Emergency Medicine | Admitting: Emergency Medicine

## 2019-12-18 ENCOUNTER — Other Ambulatory Visit: Payer: Self-pay

## 2019-12-18 ENCOUNTER — Emergency Department: Payer: 59

## 2019-12-18 DIAGNOSIS — Z7984 Long term (current) use of oral hypoglycemic drugs: Secondary | ICD-10-CM | POA: Insufficient documentation

## 2019-12-18 DIAGNOSIS — M1611 Unilateral primary osteoarthritis, right hip: Secondary | ICD-10-CM | POA: Diagnosis not present

## 2019-12-18 DIAGNOSIS — M79604 Pain in right leg: Secondary | ICD-10-CM | POA: Diagnosis present

## 2019-12-18 DIAGNOSIS — Z79899 Other long term (current) drug therapy: Secondary | ICD-10-CM | POA: Diagnosis not present

## 2019-12-18 DIAGNOSIS — Z7982 Long term (current) use of aspirin: Secondary | ICD-10-CM | POA: Insufficient documentation

## 2019-12-18 DIAGNOSIS — I10 Essential (primary) hypertension: Secondary | ICD-10-CM | POA: Insufficient documentation

## 2019-12-18 DIAGNOSIS — E1129 Type 2 diabetes mellitus with other diabetic kidney complication: Secondary | ICD-10-CM | POA: Insufficient documentation

## 2019-12-18 DIAGNOSIS — F17210 Nicotine dependence, cigarettes, uncomplicated: Secondary | ICD-10-CM | POA: Diagnosis not present

## 2019-12-18 MED ORDER — OXYCODONE HCL 5 MG PO TABS
5.0000 mg | ORAL_TABLET | Freq: Once | ORAL | Status: AC
  Administered 2019-12-18: 5 mg via ORAL
  Filled 2019-12-18: qty 1

## 2019-12-18 MED ORDER — METHOCARBAMOL 500 MG PO TABS: 500.0000 mg | ORAL_TABLET | Freq: Four times a day (QID) | ORAL | 0 refills | Status: DC

## 2019-12-18 MED ORDER — METHOCARBAMOL 500 MG PO TABS
500.0000 mg | ORAL_TABLET | Freq: Once | ORAL | Status: AC
  Administered 2019-12-18: 500 mg via ORAL
  Filled 2019-12-18: qty 1

## 2019-12-18 MED ORDER — OXYCODONE HCL 5 MG PO CAPS: 5.0000 mg | ORAL_CAPSULE | ORAL | 0 refills | Status: DC | PRN

## 2019-12-18 NOTE — Discharge Instructions (Signed)
You have arthritis in your hip. This is the only thing I can find that may be causing your pain. Take the medication as prescribed and follow up with primary care.

## 2019-12-18 NOTE — ED Triage Notes (Signed)
Pt ambulatory to triage c/o right leg pain radiating into back. Pt states pain started Friday morning when she stood up and felt pain in rt leg. NAD noted at this time.

## 2019-12-18 NOTE — ED Notes (Signed)
Pt presents to the ED for R leg pain that started Friday and radiates up her back. The pain in her back started yesterday. Pt ambulatory at this time but states it is painful. Pt tried OTC bengay and icyhot with no relief.

## 2019-12-18 NOTE — ED Provider Notes (Signed)
Lori Larsen ____________________________________________  Time seen: Approximately 12:49 PM  I have reviewed the triage vital signs and the nursing notes.  HISTORY  Chief Complaint Back Pain and Leg Pain   HPI Lori Larsen is a 54 y.o. female presents emergency department for treatment and evaluation of right leg pain started 1 to 2 days ago which is now radiating up into her back.  No known injury.  Pain started upon standing.  No relief with BenGay and icy hot.  Past Medical History:  Diagnosis Date   Diabetes mellitus without complication ([place])    Hypertension    Stroke [facility])     Patient Active Problem List   Diagnosis Date Noted   Dysfunctional or functional uterine hemorrhage 05/29/2016   Fatigue 05/29/2016   Gravida 0 05/29/2016   Hypercholesterolemia 05/29/2016   Immunization, tetanus toxoid 05/29/2016   Received influenza vaccination at hospital 05/29/2016   Burning or prickling sensation 05/29/2016   Symptom associated with female genital organs 05/29/2016   Pneumococcal vaccination given 05/29/2016   Encounter for general adult medical examination without abnormal findings 05/29/2016   Syncope 03/18/2016   Anemia 03/18/2016   CVA (cerebral vascular accident) ([place]) 11/21/2015   CVA (cerebral infarction) 10/03/2015   Avitaminosis D 05/17/2014   Climacteric menorrhagia 08/05/2006   Type 2 diabetes mellitus with other diabetic kidney complication ([place]) 54/62/7035   Current tobacco use 04/02/2006   Essential (primary) hypertension 03/10/2004    Past Surgical History:  Procedure Laterality Date   CESAREAN SECTION     COLONOSCOPY WITH PROPOFOL N/A 04/18/2016   Procedure: COLONOSCOPY WITH PROPOFOL;  Surgeon: Jonathon Bellows, MD;  Location: [facility] ENDOSCOPY;  Service: Endoscopy;  Laterality: N/A;   ESOPHAGOGASTRODUODENOSCOPY (EGD) WITH PROPOFOL N/A 04/18/2016   Procedure:  ESOPHAGOGASTRODUODENOSCOPY (EGD) WITH PROPOFOL;  Surgeon: Jonathon Bellows, MD;  Location: [facility] ENDOSCOPY;  Service: Endoscopy;  Laterality: N/A;   EUS N/A 06/26/2016   Procedure: FULL UPPER ENDOSCOPIC ULTRASOUND (EUS) RADIAL;  Surgeon: Holly Bodily, MD;  Location: [facility] ENDOSCOPY;  Service: Endoscopy;  Laterality: N/A;    Prior to Admission medications   Medication Sig Start Date End Date Taking? Authorizing Provider  amLODipine (NORVASC) 2.5 MG tablet Take 2.5 mg by mouth daily.    [provider]  aspirin 325 MG tablet Take 1 tablet (325 mg total) by mouth daily. 10/04/15   Bettey Costa, MD  atorvastatin (LIPITOR) 40 MG tablet Take 1 tablet (40 mg total) by mouth daily at 6 PM. 10/04/15   Bettey Costa, MD  clopidogrel (PLAVIX) 75 MG tablet Take 75 mg by mouth daily.    [provider]  ferrous sulfate 325 (65 FE) MG tablet Take 1 tablet by mouth 2 (two) times daily. 02/04/16   [provider]  gabapentin (NEURONTIN) 100 MG capsule Take 1 capsule by mouth 2 (two) times daily.  03/18/16   [provider]  glipiZIDE (GLUCOTROL) 5 MG tablet Take 1 tablet (5 mg total) by mouth daily. 09/27/15 09/26/16  Earleen Newport, MD  glipiZIDE (GLUCOTROL) 5 MG tablet Take 5 mg by mouth 2 (two) times daily. 04/06/17   [provider]  hydrochlorothiazide (HYDRODIURIL) 25 MG tablet Take 1 tablet by mouth daily. 12/25/15   [provider]  lisinopril (PRINIVIL,ZESTRIL) 40 MG tablet Take 1 tablet by mouth every morning. 02/04/16   [provider]  meclizine (ANTIVERT) 25 MG tablet Take 1 tablet (25 mg total) by mouth 3 (three) times daily as needed  for dizziness or nausea. Patient not taking: Reported on 04/21/2017 10/26/14   Earleen Newport, MD  methocarbamol (ROBAXIN) 500 MG tablet Take 1 tablet (500 mg total) by mouth 4 (four) times daily. 12/18/19   Minyon Billiter B, FNP  nicotine (NICODERM CQ - DOSED IN MG/24 HOURS) 21 mg/24hr patch Place onto the  skin. 10/04/15   [provider]  NOVOFINE PLUS 32G X 4 MM MISC  02/05/16   [provider]  oxycodone (OXY-IR) 5 MG capsule Take 1 capsule (5 mg total) by mouth every 4 (four) hours as needed. 12/18/19   Marilyn Wing B, FNP  pantoprazole (PROTONIX) 40 MG tablet Take 1 tablet (40 mg total) by mouth daily. Patient not taking: Reported on 04/21/2017 03/20/16   Epifanio Lesches, MD  TRESIBA FLEXTOUCH 100 UNIT/ML SOPN FlexTouch Pen Inject 24 Units into the skin every morning.  01/25/16   [provider]    Allergies Metformin and Metformin and related  Family History  Problem Relation Age of Onset   Hypertension Mother    Hypertension Father    Breast cancer Neg Hx     Social History Social History   Tobacco Use   Smoking status: Current Some Day Smoker    Packs/day: 0.50    Years: 20.00    Pack years: 10.00    Types: Cigarettes   Smokeless tobacco: Never Used  Substance Use Topics   Alcohol use: Yes    Comment:  occasionally drinks beer   Drug use: No    Review of Systems Constitutional: Well appearing. Respiratory: Negative for dyspnea. Cardiovascular: Negative for change in skin temperature or color. Musculoskeletal:   Negative for chronic steroid use   Negative for trauma in the presence of osteoporosis  Negative for age over 57 and trauma.  Negative for constitutional symptoms, or history of cancer   Negative for pain worse at night. Skin: Negative for rash, lesion, or wound.  Genitourinary: Negative for urinary retention. Rectal: Negative for fecal incontinence or new onset constipation/bowel habit changes. Hematological/Immunilogical: Negative for immunosuppression, IV drug use, or fever Neurological: Negative for burning, tingling, numb, electric, radiating pain in the lower extremities.                        Negative for saddle anesthesia.                        Negative for focal neurologic deficit, progressive or disabling  symptoms             Negative for saddle anesthesia. ____________________________________________   PHYSICAL EXAM:  VITAL SIGNS: ED Triage Vitals  Enc Vitals Group     BP 12/18/19 1110 139/72     Pulse Rate 12/18/19 1110 68     Resp 12/18/19 1110 18     Temp 12/18/19 1110 98.6 F (37 C)     Temp Source 12/18/19 1110 Oral     SpO2 12/18/19 1110 100 %     Weight 12/18/19 1111 160 lb (72.6 kg)     Height 12/18/19 1111 5' (1.524 m)     Head Circumference --      Peak Flow --      Pain Score 12/18/19 1116 8     Pain Loc --      Pain Edu? --      Excl. in [place]? --     Constitutional: Alert and oriented.No acute distress. Eyes: Conjunctivae are clear  without discharge or drainage.  Head: Atraumatic. Neck: Full, active range of motion. Respiratory: Respirations even and unlabored. Musculoskeletal: Full, active ROM of the back and extremities, Strength 5/5 of the lower extremities as tested. Neurologic: Reflexes of the lower extremities are 2+.  Negative straight leg raise on the right and left side. Skin: Atraumatic.  Psychiatric: Behavior and affect are normal.  ____________________________________________   LABS (all labs ordered are listed, but only abnormal results are displayed)  Labs Reviewed - No data to display ____________________________________________  RADIOLOGY  Image of the right femur shows no acute findings.  There is degenerative joint changes of the right hip. ____________________________________________   PROCEDURES  Procedure(s) performed:  Procedures ____________________________________________   INITIAL IMPRESSION / ASSESSMENT AND PLAN / ED COURSE  Lori Larsen is a 54 y.o. female presenting to the emergency department for treatment and evaluation after nontraumatic right lower extremity pain. See HPI for further details.  Pain is not localized in any 1 area.  She is able to demonstrate flexion and extension of the right knee without  increase in pain of the right hip. Plan will be to treat her with oxycodone and robaxin and get an image of the femur/hip/knee.   Image shows osteoarthritis of the right hip. She will be treated with medication as listed below. She is to follow up with her PCP.   Medications  oxyCODONE (Oxy IR/ROXICODONE) immediate release tablet 5 mg (5 mg Oral Given 12/18/19 1330)  methocarbamol (ROBAXIN) tablet 500 mg (500 mg Oral Given 12/18/19 1329)    ED Discharge Orders         Ordered    methocarbamol (ROBAXIN) 500 MG tablet  4 times daily        12/18/19 1506    oxycodone (OXY-IR) 5 MG capsule  Every 4 hours PRN        12/18/19 1506           Pertinent labs & imaging results that were available during my care of the patient were reviewed by me and considered in my medical decision making (see chart for details).  _________________________________________   FINAL CLINICAL IMPRESSION(S) / ED DIAGNOSES  Final diagnoses:  Osteoarthritis of right hip, unspecified osteoarthritis type     If controlled substance prescribed during this visit, 12 month history viewed on the [place] prior to issuing an initial prescription for Schedule II or III opiod.   Victorino Dike, FNP 12/18/19 1733    Duffy Bruce, MD 12/18/19 2135

## 2020-02-06 ENCOUNTER — Other Ambulatory Visit: Payer: Self-pay | Admitting: Internal Medicine

## 2020-02-06 DIAGNOSIS — Z1231 Encounter for screening mammogram for malignant neoplasm of breast: Secondary | ICD-10-CM

## 2020-03-08 ENCOUNTER — Other Ambulatory Visit: Payer: Self-pay

## 2020-03-08 ENCOUNTER — Ambulatory Visit
Admission: RE | Admit: 2020-03-08 | Discharge: 2020-03-08 | Disposition: A | Discharge status: Home / Self Care | Payer: 59 | Source: Ambulatory Visit | Attending: Internal Medicine | Admitting: Internal Medicine

## 2020-03-08 DIAGNOSIS — Z1231 Encounter for screening mammogram for malignant neoplasm of breast: Secondary | ICD-10-CM | POA: Diagnosis not present

## 2020-08-18 ENCOUNTER — Emergency Department
Admission: EM | Admit: 2020-08-18 | Discharge: 2020-08-18 | Disposition: A | Discharge status: Home / Self Care | Payer: 59 | Attending: Emergency Medicine | Admitting: Emergency Medicine

## 2020-08-18 ENCOUNTER — Other Ambulatory Visit: Payer: Self-pay

## 2020-08-18 DIAGNOSIS — I10 Essential (primary) hypertension: Secondary | ICD-10-CM | POA: Insufficient documentation

## 2020-08-18 DIAGNOSIS — Z7982 Long term (current) use of aspirin: Secondary | ICD-10-CM | POA: Diagnosis not present

## 2020-08-18 DIAGNOSIS — M25551 Pain in right hip: Secondary | ICD-10-CM | POA: Insufficient documentation

## 2020-08-18 DIAGNOSIS — Z7984 Long term (current) use of oral hypoglycemic drugs: Secondary | ICD-10-CM | POA: Diagnosis not present

## 2020-08-18 DIAGNOSIS — F17210 Nicotine dependence, cigarettes, uncomplicated: Secondary | ICD-10-CM | POA: Insufficient documentation

## 2020-08-18 DIAGNOSIS — Z5321 Procedure and treatment not carried out due to patient leaving prior to being seen by health care provider: Secondary | ICD-10-CM | POA: Diagnosis not present

## 2020-08-18 DIAGNOSIS — E119 Type 2 diabetes mellitus without complications: Secondary | ICD-10-CM | POA: Diagnosis not present

## 2020-08-18 DIAGNOSIS — Z79899 Other long term (current) drug therapy: Secondary | ICD-10-CM | POA: Diagnosis not present

## 2020-08-18 DIAGNOSIS — Z7902 Long term (current) use of antithrombotics/antiplatelets: Secondary | ICD-10-CM | POA: Insufficient documentation

## 2020-08-18 DIAGNOSIS — Z794 Long term (current) use of insulin: Secondary | ICD-10-CM | POA: Insufficient documentation

## 2020-08-18 NOTE — ED Provider Notes (Signed)
[facility] Emergency Department Provider Note  ____________________________________________  Time seen: Approximately 9:41 PM  I have reviewed the triage vital signs and the nursing notes.   HISTORY  Chief Complaint Hypertension and Hip Pain    HPI Lori Larsen is a 55 y.o. female who presents to the emergency department complaining of hypertension and hip pain.  Patient states that she has known arthritis of the hip but that her blood pressure had been running elevated for the past several days.  Patient contacted her primary care, was increased on her medications at home but continued to experience hypertension.  Hypertension was in the 633H/545G systolic and she had developed a headache.  Patient was concerned that her headache could be an early sign of a CVA which she does have a history of.  Patient denied any unilateral weakness, difficulty formulating thoughts or words.  She came for evaluation of same.  History of diabetes, hypertension, CVA, arthritis.       Past Medical History:  Diagnosis Date   Diabetes mellitus without complication ([place])    Hypertension    Stroke [facility])     Patient Active Problem List   Diagnosis Date Noted   Dysfunctional or functional uterine hemorrhage 05/29/2016   Fatigue 05/29/2016   Gravida 0 05/29/2016   Hypercholesterolemia 05/29/2016   Immunization, tetanus toxoid 05/29/2016   Received influenza vaccination at hospital 05/29/2016   Burning or prickling sensation 05/29/2016   Symptom associated with female genital organs 05/29/2016   Pneumococcal vaccination given 05/29/2016   Encounter for general adult medical examination without abnormal findings 05/29/2016   Syncope 03/18/2016   Anemia 03/18/2016   CVA (cerebral vascular accident) ([place]) 11/21/2015   CVA (cerebral infarction) 10/03/2015   Avitaminosis D 05/17/2014   Climacteric menorrhagia 08/05/2006   Type 2 diabetes mellitus with other diabetic kidney  complication ([place]) 25/63/8937   Current tobacco use 04/02/2006   Essential (primary) hypertension 03/10/2004    Past Surgical History:  Procedure Laterality Date   CESAREAN SECTION     COLONOSCOPY WITH PROPOFOL N/A 04/18/2016   Procedure: COLONOSCOPY WITH PROPOFOL;  Surgeon: Jonathon Bellows, MD;  Location: [facility] ENDOSCOPY;  Service: Endoscopy;  Laterality: N/A;   ESOPHAGOGASTRODUODENOSCOPY (EGD) WITH PROPOFOL N/A 04/18/2016   Procedure: ESOPHAGOGASTRODUODENOSCOPY (EGD) WITH PROPOFOL;  Surgeon: Jonathon Bellows, MD;  Location: [facility] ENDOSCOPY;  Service: Endoscopy;  Laterality: N/A;   EUS N/A 06/26/2016   Procedure: FULL UPPER ENDOSCOPIC ULTRASOUND (EUS) RADIAL;  Surgeon: Holly Bodily, MD;  Location: [facility] ENDOSCOPY;  Service: Endoscopy;  Laterality: N/A;    Prior to Admission medications   Medication Sig Start Date End Date Taking? Authorizing Provider  amLODipine (NORVASC) 2.5 MG tablet Take 2.5 mg by mouth daily.    [provider]  aspirin 325 MG tablet Take 1 tablet (325 mg total) by mouth daily. 10/04/15   Bettey Costa, MD  atorvastatin (LIPITOR) 40 MG tablet Take 1 tablet (40 mg total) by mouth daily at 6 PM. 10/04/15   Bettey Costa, MD  clopidogrel (PLAVIX) 75 MG tablet Take 75 mg by mouth daily.    [provider]  ferrous sulfate 325 (65 FE) MG tablet Take 1 tablet by mouth 2 (two) times daily. 02/04/16   [provider]  gabapentin (NEURONTIN) 100 MG capsule Take 1 capsule by mouth 2 (two) times daily.  03/18/16   [provider]  glipiZIDE (GLUCOTROL) 5 MG tablet Take 1 tablet (5 mg total) by mouth daily. 09/27/15 09/26/16  Lenise Arena  E, MD  glipiZIDE (GLUCOTROL) 5 MG tablet Take 5 mg by mouth 2 (two) times daily. 04/06/17   [provider]  hydrochlorothiazide (HYDRODIURIL) 25 MG tablet Take 1 tablet by mouth daily. 12/25/15   [provider]  lisinopril (PRINIVIL,ZESTRIL) 40 MG tablet Take 1 tablet by mouth every morning. 02/04/16    [provider]  meclizine (ANTIVERT) 25 MG tablet Take 1 tablet (25 mg total) by mouth 3 (three) times daily as needed for dizziness or nausea. Patient not taking: Reported on 04/21/2017 10/26/14   Earleen Newport, MD  methocarbamol (ROBAXIN) 500 MG tablet Take 1 tablet (500 mg total) by mouth 4 (four) times daily. 12/18/19   Triplett, Cari B, FNP  nicotine (NICODERM CQ - DOSED IN MG/24 HOURS) 21 mg/24hr patch Place onto the skin. 10/04/15   [provider]  NOVOFINE PLUS 32G X 4 MM MISC  02/05/16   [provider]  oxycodone (OXY-IR) 5 MG capsule Take 1 capsule (5 mg total) by mouth every 4 (four) hours as needed. 12/18/19   Triplett, Cari B, FNP  pantoprazole (PROTONIX) 40 MG tablet Take 1 tablet (40 mg total) by mouth daily. Patient not taking: Reported on 04/21/2017 03/20/16   Epifanio Lesches, MD  TRESIBA FLEXTOUCH 100 UNIT/ML SOPN FlexTouch Pen Inject 24 Units into the skin every morning.  01/25/16   [provider]    Allergies Metformin and Metformin and related  Family History  Problem Relation Age of Onset   Hypertension Mother    Hypertension Father    Breast cancer Neg Hx     Social History Social History   Tobacco Use   Smoking status: Some Days    Packs/day: 0.50    Years: 20.00    Pack years: 10.00    Types: Cigarettes   Smokeless tobacco: Never  Substance Use Topics   Alcohol use: Yes    Comment:  occasionally drinks beer   Drug use: No     Review of Systems  Constitutional: No fever/chills Eyes: No visual changes. No discharge ENT: No upper respiratory complaints. Cardiovascular: no chest pain. Respiratory: no cough. No SOB. Gastrointestinal: No abdominal pain.  No nausea, no vomiting.  No diarrhea.  No constipation. Genitourinary: Negative for dysuria. No hematuria Musculoskeletal: Positive for right hip pain Skin: Negative for rash, abrasions, lacerations, ecchymosis. Neurological: Positive for headache,  denies focal weakness or numbness.  10 System ROS otherwise negative.  ____________________________________________   PHYSICAL EXAM:  VITAL SIGNS: ED Triage Vitals  Enc Vitals Group     BP 08/18/20 2031 (!) 148/78     Pulse Rate 08/18/20 2031 62     Resp 08/18/20 2031 16     Temp 08/18/20 2031 98.3 F (36.8 C)     Temp Source 08/18/20 2031 Oral     SpO2 08/18/20 2031 98 %     Weight 08/18/20 2030 160 lb (72.6 kg)     Height 08/18/20 2030 5' (1.524 m)     Head Circumference --      Peak Flow --      Pain Score 08/18/20 2030 8     Pain Loc --      Pain Edu? --      Excl. in [place]? --      Constitutional: Alert and oriented. Well appearing and in no acute distress. Eyes: Conjunctivae are normal. PERRL. EOMI. Head: Atraumatic. ENT:      Ears:       Nose: No congestion/rhinnorhea.  Mouth/Throat: Mucous membranes are moist.  Neck: No stridor.    Cardiovascular: Normal rate, regular rhythm. Normal S1 and S2.  Good peripheral circulation. Respiratory: Normal respiratory effort without tachypnea or retractions. Lungs CTAB. Good air entry to the bases with no decreased or absent breath sounds. Musculoskeletal: Full range of motion to all extremities. No gross deformities appreciated. Neurologic:  Normal speech and language. No gross focal neurologic deficits are appreciated.  Cranial nerves II through XII grossly intact. Skin:  Skin is warm, dry and intact. No rash noted. Psychiatric: Mood and affect are normal. Speech and behavior are normal. Patient exhibits appropriate insight and judgement.   ____________________________________________   LABS (all labs ordered are listed, but only abnormal results are displayed)  Labs Reviewed - No data to display ____________________________________________  EKG   ____________________________________________  RADIOLOGY   No results found.  ____________________________________________    PROCEDURES  Procedure(s)  performed:    Procedures    Medications - No data to display   ____________________________________________   INITIAL IMPRESSION / ASSESSMENT AND PLAN / ED COURSE  Pertinent labs & imaging results that were available during my care of the patient were reviewed by me and considered in my medical decision making (see chart for details).  Review of the [place] CSRS was performed in accordance of the [place] prior to dispensing any controlled drugs.           Patient eloped prior to final diagnosis and disposition.  Patient presents emergency department complaining of hypertension in the setting of increased hip pain.  She has known osteoarthritis to the hip, states that she is well aware of what is going on with this hip pain.  She was more concerned that her blood pressure had been elevated in the 503/888 systolic.  She was concerned as her blood pressure typically runs in the 120s.  Her prescribed medications had been increased from her primary care but as she was still experiencing hypotension she presented for evaluation.  Exam is reassuring at this time.  Patient's blood pressure had been 148/78 on arrival, was 130s over 60s during the encounter.  Prior to diagnosis and disposition patient eloped out of the emergency department without informing myself or other staff.  This chart was dictated using voice recognition software/Dragon. Despite best efforts to proofread, errors can occur which can change the meaning. Any change was purely unintentional.    Darletta Moll, PA-C 08/18/20 2255    Naaman Plummer, MD 08/18/20 [phone]

## 2020-08-18 NOTE — ED Notes (Signed)
This RN to bedside to check in on patient. Patient not found in room.

## 2020-08-18 NOTE — ED Triage Notes (Signed)
Pt states her BP has been elevated due to the pain in her right hip due osteoarthritis, pt states she has been taking all of her prescribed meds to help with the pain and HTN, but she is still in pain.

## 2020-08-18 NOTE — ED Notes (Signed)
Patient incorrectly roomed to flex. Provider notified, but states patient ok to be seen in flex at this time.

## 2020-08-18 NOTE — ED Notes (Signed)
Patient not found in lobby, flex area, triage, or restrooms near room. Patient suspected to have left department. Provider notified.

## 2021-02-12 ENCOUNTER — Other Ambulatory Visit: Payer: Self-pay | Admitting: Family

## 2021-02-12 ENCOUNTER — Other Ambulatory Visit: Payer: Self-pay | Admitting: Internal Medicine

## 2021-02-12 DIAGNOSIS — Z1231 Encounter for screening mammogram for malignant neoplasm of breast: Secondary | ICD-10-CM

## 2021-03-14 ENCOUNTER — Ambulatory Visit
Admission: RE | Admit: 2021-03-14 | Discharge: 2021-03-14 | Disposition: A | Discharge status: Home / Self Care | Payer: BC Managed Care – PPO | Source: Ambulatory Visit | Attending: Family | Admitting: Family

## 2021-03-14 ENCOUNTER — Other Ambulatory Visit: Payer: Self-pay

## 2021-03-14 DIAGNOSIS — Z1231 Encounter for screening mammogram for malignant neoplasm of breast: Secondary | ICD-10-CM | POA: Diagnosis not present

## 2022-03-19 ENCOUNTER — Other Ambulatory Visit: Payer: Self-pay | Admitting: Family

## 2022-03-19 DIAGNOSIS — Z1231 Encounter for screening mammogram for malignant neoplasm of breast: Secondary | ICD-10-CM

## 2022-03-25 ENCOUNTER — Other Ambulatory Visit: Payer: Self-pay

## 2022-03-25 DIAGNOSIS — E559 Vitamin D deficiency, unspecified: Secondary | ICD-10-CM | POA: Diagnosis not present

## 2022-03-25 DIAGNOSIS — E1169 Type 2 diabetes mellitus with other specified complication: Secondary | ICD-10-CM | POA: Diagnosis not present

## 2022-03-25 DIAGNOSIS — M150 Primary generalized (osteo)arthritis: Secondary | ICD-10-CM | POA: Diagnosis not present

## 2022-03-25 DIAGNOSIS — E782 Mixed hyperlipidemia: Secondary | ICD-10-CM | POA: Diagnosis not present

## 2022-03-25 DIAGNOSIS — I10 Essential (primary) hypertension: Secondary | ICD-10-CM | POA: Diagnosis not present

## 2022-03-25 MED ORDER — ICOSAPENT ETHYL 1 G PO CAPS
2.0000 g | ORAL_CAPSULE | Freq: Two times a day (BID) | ORAL | 5 refills | Status: DC
  Filled 2022-03-25: qty 120, 30d supply, fill #0

## 2022-03-25 MED ORDER — MOUNJARO 5 MG/0.5ML ~~LOC~~ SOAJ
5.0000 mg | SUBCUTANEOUS | 3 refills | Status: DC
  Filled 2022-03-25: qty 2, 28d supply, fill #0
  Filled 2022-04-22: qty 2, 28d supply, fill #1
  Filled 2022-05-19: qty 2, 28d supply, fill #2

## 2022-03-25 MED ORDER — GLIPIZIDE 5 MG PO TABS
5.0000 mg | ORAL_TABLET | Freq: Two times a day (BID) | ORAL | 1 refills | Status: DC
  Filled 2022-03-25: qty 180, 90d supply, fill #0
  Filled 2022-09-05: qty 180, 90d supply, fill #1

## 2022-03-25 MED ORDER — CLOPIDOGREL BISULFATE 75 MG PO TABS
75.0000 mg | ORAL_TABLET | Freq: Every day | ORAL | 1 refills | Status: DC
  Filled 2022-03-25: qty 90, 90d supply, fill #0
  Filled 2022-07-01: qty 90, 90d supply, fill #1

## 2022-03-25 MED ORDER — OMEPRAZOLE 40 MG PO CPDR
40.0000 mg | DELAYED_RELEASE_CAPSULE | Freq: Every day | ORAL | 1 refills | Status: DC
  Filled 2022-03-25: qty 90, 90d supply, fill #0
  Filled 2022-08-07: qty 90, 90d supply, fill #1

## 2022-03-25 MED ORDER — ATORVASTATIN CALCIUM 40 MG PO TABS
40.0000 mg | ORAL_TABLET | Freq: Every day | ORAL | 3 refills | Status: DC
  Filled 2022-03-25: qty 90, 90d supply, fill #0

## 2022-03-25 MED ORDER — HYDROCHLOROTHIAZIDE 25 MG PO TABS
25.0000 mg | ORAL_TABLET | Freq: Every morning | ORAL | 1 refills | Status: DC
  Filled 2022-03-25: qty 90, 90d supply, fill #0
  Filled 2022-09-05: qty 90, 90d supply, fill #1

## 2022-03-26 ENCOUNTER — Ambulatory Visit
Admission: RE | Admit: 2022-03-26 | Discharge: 2022-03-26 | Disposition: A | Discharge status: Home / Self Care | Payer: 59 | Source: Ambulatory Visit | Attending: Family | Admitting: Family

## 2022-03-26 DIAGNOSIS — Z1231 Encounter for screening mammogram for malignant neoplasm of breast: Secondary | ICD-10-CM | POA: Diagnosis not present

## 2022-06-01 ENCOUNTER — Other Ambulatory Visit: Payer: Self-pay | Admitting: Family

## 2022-06-04 ENCOUNTER — Other Ambulatory Visit: Payer: Self-pay

## 2022-06-04 ENCOUNTER — Encounter: Payer: Self-pay | Admitting: Family

## 2022-06-04 ENCOUNTER — Ambulatory Visit (INDEPENDENT_AMBULATORY_CARE_PROVIDER_SITE_OTHER): Payer: Commercial Managed Care - PPO | Admitting: Family

## 2022-06-04 VITALS — BP 118/62 | HR 100 | Ht 60.0 in | Wt 120.0 lb

## 2022-06-04 DIAGNOSIS — M159 Polyosteoarthritis, unspecified: Secondary | ICD-10-CM | POA: Diagnosis not present

## 2022-06-04 DIAGNOSIS — I10 Essential (primary) hypertension: Secondary | ICD-10-CM | POA: Diagnosis not present

## 2022-06-04 DIAGNOSIS — E559 Vitamin D deficiency, unspecified: Secondary | ICD-10-CM | POA: Diagnosis not present

## 2022-06-04 DIAGNOSIS — E1165 Type 2 diabetes mellitus with hyperglycemia: Secondary | ICD-10-CM

## 2022-06-04 DIAGNOSIS — E1129 Type 2 diabetes mellitus with other diabetic kidney complication: Secondary | ICD-10-CM

## 2022-06-04 DIAGNOSIS — E7800 Pure hypercholesterolemia, unspecified: Secondary | ICD-10-CM

## 2022-06-04 LAB — POC CREATINE & ALBUMIN,URINE
Creatinine, POC: 300 mg/dL
Microalbumin Ur, POC: 150 mg/L

## 2022-06-04 LAB — POCT CBG (FASTING - GLUCOSE)-MANUAL ENTRY: Glucose Fasting, POC: 195 mg/dL — AB (ref 70–99)

## 2022-06-04 MED ORDER — CELECOXIB 200 MG PO CAPS
200.0000 mg | ORAL_CAPSULE | Freq: Two times a day (BID) | ORAL | 1 refills | Status: DC
  Filled 2022-06-04: qty 180, 90d supply, fill #0
  Filled 2022-09-05: qty 180, 90d supply, fill #1

## 2022-06-04 MED ORDER — LISINOPRIL 20 MG PO TABS
20.0000 mg | ORAL_TABLET | Freq: Every day | ORAL | 3 refills | Status: DC
  Filled 2022-06-04: qty 90, 90d supply, fill #0
  Filled 2022-09-05: qty 90, 90d supply, fill #1
  Filled 2022-12-01: qty 90, 90d supply, fill #2
  Filled 2023-02-23: qty 90, 90d supply, fill #3

## 2022-06-04 MED ORDER — AMLODIPINE BESYLATE 5 MG PO TABS
5.0000 mg | ORAL_TABLET | Freq: Every day | ORAL | 3 refills | Status: DC
  Filled 2022-06-04: qty 90, 90d supply, fill #0

## 2022-06-05 ENCOUNTER — Other Ambulatory Visit: Payer: Self-pay

## 2022-06-05 LAB — CBC WITH DIFFERENTIAL
Basophils Absolute: 0.1 10*3/uL (ref 0.0–0.2)
Basos: 1 %
EOS (ABSOLUTE): 0.2 10*3/uL (ref 0.0–0.4)
Eos: 2 %
Hematocrit: 34.1 % (ref 34.0–46.6)
Hemoglobin: 10.9 g/dL — ABNORMAL LOW (ref 11.1–15.9)
Immature Grans (Abs): 0 10*3/uL (ref 0.0–0.1)
Immature Granulocytes: 0 %
Lymphocytes Absolute: 2.6 10*3/uL (ref 0.7–3.1)
Lymphs: 26 %
MCH: 29.8 pg (ref 26.6–33.0)
MCHC: 32 g/dL (ref 31.5–35.7)
MCV: 93 fL (ref 79–97)
Monocytes Absolute: 0.4 10*3/uL (ref 0.1–0.9)
Monocytes: 4 %
Neutrophils Absolute: 7 10*3/uL (ref 1.4–7.0)
Neutrophils: 67 %
RBC: 3.66 x10E6/uL — ABNORMAL LOW (ref 3.77–5.28)
RDW: 12.9 % (ref 11.7–15.4)
WBC: 10.3 10*3/uL (ref 3.4–10.8)

## 2022-06-05 LAB — CMP14+EGFR
ALT: 12 IU/L (ref 0–32)
AST: 15 IU/L (ref 0–40)
Albumin/Globulin Ratio: 1.6 (ref 1.2–2.2)
Albumin: 4.5 g/dL (ref 3.8–4.9)
Alkaline Phosphatase: 101 IU/L (ref 44–121)
BUN/Creatinine Ratio: 23 (ref 9–23)
BUN: 26 mg/dL — ABNORMAL HIGH (ref 6–24)
Bilirubin Total: <0.2 mg/dL (ref 0.0–1.2)
CO2: 23 mmol/L (ref 20–29)
Calcium: 10 mg/dL (ref 8.7–10.2)
Chloride: 107 mmol/L — ABNORMAL HIGH (ref 96–106)
Creatinine, Ser: 1.12 mg/dL — ABNORMAL HIGH (ref 0.57–1.00)
Globulin, Total: 2.8 g/dL (ref 1.5–4.5)
Glucose: 147 mg/dL — ABNORMAL HIGH (ref 70–99)
Potassium: 4.3 mmol/L (ref 3.5–5.2)
Sodium: 143 mmol/L (ref 134–144)
Total Protein: 7.3 g/dL (ref 6.0–8.5)
eGFR: 57 mL/min/{1.73_m2} — ABNORMAL LOW (ref >59)

## 2022-06-05 LAB — LIPID PANEL
Chol/HDL Ratio: 5.6 ratio — ABNORMAL HIGH (ref 0.0–4.4)
Cholesterol, Total: 213 mg/dL — ABNORMAL HIGH (ref 100–199)
HDL: 38 mg/dL — ABNORMAL LOW (ref >39)
LDL Chol Calc (NIH): 121 mg/dL — ABNORMAL HIGH (ref 0–99)
Triglycerides: 310 mg/dL — ABNORMAL HIGH (ref 0–149)
VLDL Cholesterol Cal: 54 mg/dL — ABNORMAL HIGH (ref 5–40)

## 2022-06-05 LAB — HEMOGLOBIN A1C
Est. average glucose Bld gHb Est-mCnc: 148 mg/dL
Hgb A1c MFr Bld: 6.8 % — ABNORMAL HIGH (ref 4.8–5.6)

## 2022-06-05 MED ORDER — MOUNJARO 2.5 MG/0.5ML ~~LOC~~ SOAJ
2.5000 mg | SUBCUTANEOUS | 1 refills | Status: DC
  Filled 2022-06-05 – 2022-06-16 (×2): qty 2, 28d supply, fill #0
  Filled 2022-07-14: qty 2, 28d supply, fill #1
  Filled 2022-08-11: qty 2, 28d supply, fill #2
  Filled 2022-09-08: qty 2, 28d supply, fill #3
  Filled 2022-10-10: qty 2, 28d supply, fill #4

## 2022-06-05 NOTE — Progress Notes (Signed)
Established Patient Office Visit  Subjective:  Patient ID: Lori Larsen, female    DOB: 02/08/1966  Age: 57 y.o. MRN: [id number]  Chief Complaint  Patient presents with   Follow-up    3 month follow up    Patient is here today for her 3 months follow up.  She has been feeling well since last appointment.   She does not have additional concerns to discuss today.  Labs are due today. She needs refills.   I have reviewed her active problem list, medication list, allergies, social history, notes from last encounter, and lab results for her appointment today.  No other concerns at this time.   Past Medical History:  Diagnosis Date   Diabetes mellitus without complication ([place])    History of stroke 09/27/2019   Hypertension    Stroke [facility])     Past Surgical History:  Procedure Laterality Date   CESAREAN SECTION     COLONOSCOPY WITH PROPOFOL N/A 04/18/2016   Procedure: COLONOSCOPY WITH PROPOFOL;  Surgeon: Jonathon Bellows, MD;  Location: [facility] ENDOSCOPY;  Service: Endoscopy;  Laterality: N/A;   ESOPHAGOGASTRODUODENOSCOPY (EGD) WITH PROPOFOL N/A 04/18/2016   Procedure: ESOPHAGOGASTRODUODENOSCOPY (EGD) WITH PROPOFOL;  Surgeon: Jonathon Bellows, MD;  Location: [facility] ENDOSCOPY;  Service: Endoscopy;  Laterality: N/A;   EUS N/A 06/26/2016   Procedure: FULL UPPER ENDOSCOPIC ULTRASOUND (EUS) RADIAL;  Surgeon: Holly Bodily, MD;  Location: [facility] ENDOSCOPY;  Service: Endoscopy;  Laterality: N/A;    Social History   Socioeconomic History   Marital status: Single    Spouse name: Not on file   Number of children: Not on file   Years of education: Not on file   Highest education level: Not on file  Occupational History   Occupation: works at [employer]   Smoking status: Some Days    Packs/day: 0.50    Years: 20.00    Additional pack years: 0.00    Total pack years: 10.00    Types: Cigarettes   Smokeless tobacco: Never  Substance and Sexual Activity   Alcohol use:  Yes    Comment:  occasionally drinks beer   Drug use: No   Sexual activity: Yes    Birth control/protection: Post-menopausal  Other Topics Concern   Not on file  Social History Narrative   Not on file   Social Determinants of Health   Financial Resource Strain: Not on file  Food Insecurity: Not on file  Transportation Needs: Not on file  Physical Activity: Not on file  Stress: Not on file  Social Connections: Not on file  Intimate Partner Violence: Not on file    Family History  Problem Relation Age of Onset   Hypertension Mother    Hypertension Father    Breast cancer Neg Hx     Allergies  Allergen Reactions   Metformin Nausea Only and Other (See Comments)    Review of Systems  Constitutional:  Positive for weight loss.  All other systems reviewed and are negative.      Objective:   BP 118/62   Pulse 100   Ht 5' (1.524 m)   Wt 120 lb (54.4 kg)   SpO2 99%   BMI 23.44 kg/m   Vitals:   06/04/22 1036  BP: 118/62  Pulse: 100  Height: 5' (1.524 m)  Weight: 120 lb (54.4 kg)  SpO2: 99%  BMI (Calculated): 23.44    Physical Exam Vitals and nursing note reviewed.  Constitutional:  Appearance: Normal appearance. She is obese.  HENT:     Head: Normocephalic and atraumatic.  Eyes:     Extraocular Movements: Extraocular movements intact.     Conjunctiva/sclera: Conjunctivae normal.     Pupils: Pupils are equal, round, and reactive to light.  Cardiovascular:     Rate and Rhythm: Normal rate.  Pulmonary:     Effort: Pulmonary effort is normal.  Neurological:     Mental Status: She is alert.    Results for orders placed or performed in visit on 06/04/22  Lipid panel  Result Value Ref Range   Cholesterol, Total 213 (H) 100 - 199 mg/dL   Triglycerides 310 (H) 0 - 149 mg/dL   HDL 38 (L) >39 mg/dL   VLDL Cholesterol Cal 54 (H) 5 - 40 mg/dL   LDL Chol Calc (NIH) 121 (H) 0 - 99 mg/dL   Chol/HDL Ratio 5.6 (H) 0.0 - 4.4 ratio  CBC With Differential   Result Value Ref Range   WBC 10.3 3.4 - 10.8 x10E3/uL   RBC 3.66 (L) 3.77 - 5.28 x10E6/uL   Hemoglobin 10.9 (L) 11.1 - 15.9 g/dL   Hematocrit 34.1 34.0 - 46.6 %   MCV 93 79 - 97 fL   MCH 29.8 26.6 - 33.0 pg   MCHC 32.0 31.5 - 35.7 g/dL   RDW 12.9 11.7 - 15.4 %   Neutrophils 67 Not Estab. %   Lymphs 26 Not Estab. %   Monocytes 4 Not Estab. %   Eos 2 Not Estab. %   Basos 1 Not Estab. %   Neutrophils Absolute 7.0 1.4 - 7.0 x10E3/uL   Lymphocytes Absolute 2.6 0.7 - 3.1 x10E3/uL   Monocytes Absolute 0.4 0.1 - 0.9 x10E3/uL   EOS (ABSOLUTE) 0.2 0.0 - 0.4 x10E3/uL   Basophils Absolute 0.1 0.0 - 0.2 x10E3/uL   Immature Granulocytes 0 Not Estab. %   Immature Grans (Abs) 0.0 0.0 - 0.1 x10E3/uL  CMP14+EGFR  Result Value Ref Range   Glucose 147 (H) 70 - 99 mg/dL   BUN 26 (H) 6 - 24 mg/dL   Creatinine, Ser 1.12 (H) 0.57 - 1.00 mg/dL   eGFR 57 (L) >59 mL/min/1.73   BUN/Creatinine Ratio 23 9 - 23   Sodium 143 134 - 144 mmol/L   Potassium 4.3 3.5 - 5.2 mmol/L   Chloride 107 (H) 96 - 106 mmol/L   CO2 23 20 - 29 mmol/L   Calcium 10.0 8.7 - 10.2 mg/dL   Total Protein 7.3 6.0 - 8.5 g/dL   Albumin 4.5 3.8 - 4.9 g/dL   Globulin, Total 2.8 1.5 - 4.5 g/dL   Albumin/Globulin Ratio 1.6 1.2 - 2.2   Bilirubin Total <0.2 0.0 - 1.2 mg/dL   Alkaline Phosphatase 101 44 - 121 IU/L   AST 15 0 - 40 IU/L   ALT 12 0 - 32 IU/L  Hemoglobin A1c  Result Value Ref Range   Hgb A1c MFr Bld 6.8 (H) 4.8 - 5.6 %   Est. average glucose Bld gHb Est-mCnc 148 mg/dL  POCT CBG (Fasting - Glucose)  Result Value Ref Range   Glucose Fasting, POC 195 (A) 70 - 99 mg/dL  POC CREATINE & ALBUMIN,URINE  Result Value Ref Range   Microalbumin Ur, POC 150 mg/L   Creatinine, POC 300 mg/dL   Albumin/Creatinine Ratio, Urine, POC 30-300     Recent Results (from the past 2160 hour(s))  POCT CBG (Fasting - Glucose)     Status: Abnormal  Collection Time: 06/04/22 10:43 AM  Result Value Ref Range   Glucose Fasting, POC 195 (A)  70 - 99 mg/dL  Lipid panel     Status: Abnormal   Collection Time: 06/04/22 11:07 AM  Result Value Ref Range   Cholesterol, Total 213 (H) 100 - 199 mg/dL   Triglycerides 310 (H) 0 - 149 mg/dL   HDL 38 (L) >39 mg/dL   VLDL Cholesterol Cal 54 (H) 5 - 40 mg/dL   LDL Chol Calc (NIH) 121 (H) 0 - 99 mg/dL   Chol/HDL Ratio 5.6 (H) 0.0 - 4.4 ratio    Comment:                                   T. Chol/HDL Ratio                                             Men  Women                               1/2 Avg.Risk  3.4    3.3                                   Avg.Risk  5.0    4.4                                2X Avg.Risk  9.6    7.1                                3X Avg.Risk 23.4   11.0   CBC With Differential     Status: Abnormal   Collection Time: 06/04/22 11:07 AM  Result Value Ref Range   WBC 10.3 3.4 - 10.8 x10E3/uL   RBC 3.66 (L) 3.77 - 5.28 x10E6/uL   Hemoglobin 10.9 (L) 11.1 - 15.9 g/dL   Hematocrit 34.1 34.0 - 46.6 %   MCV 93 79 - 97 fL   MCH 29.8 26.6 - 33.0 pg   MCHC 32.0 31.5 - 35.7 g/dL   RDW 12.9 11.7 - 15.4 %   Neutrophils 67 Not Estab. %   Lymphs 26 Not Estab. %   Monocytes 4 Not Estab. %   Eos 2 Not Estab. %   Basos 1 Not Estab. %   Neutrophils Absolute 7.0 1.4 - 7.0 x10E3/uL   Lymphocytes Absolute 2.6 0.7 - 3.1 x10E3/uL   Monocytes Absolute 0.4 0.1 - 0.9 x10E3/uL   EOS (ABSOLUTE) 0.2 0.0 - 0.4 x10E3/uL   Basophils Absolute 0.1 0.0 - 0.2 x10E3/uL   Immature Granulocytes 0 Not Estab. %   Immature Grans (Abs) 0.0 0.0 - 0.1 x10E3/uL  CMP14+EGFR     Status: Abnormal   Collection Time: 06/04/22 11:07 AM  Result Value Ref Range   Glucose 147 (H) 70 - 99 mg/dL   BUN 26 (H) 6 - 24 mg/dL   Creatinine, Ser 1.12 (H) 0.57 - 1.00 mg/dL   eGFR 57 (L) >59 mL/min/1.73   BUN/Creatinine Ratio 23 9 - 23   Sodium 143 134 -  144 mmol/L   Potassium 4.3 3.5 - 5.2 mmol/L   Chloride 107 (H) 96 - 106 mmol/L   CO2 23 20 - 29 mmol/L   Calcium 10.0 8.7 - 10.2 mg/dL   Total Protein 7.3 6.0 -  8.5 g/dL   Albumin 4.5 3.8 - 4.9 g/dL   Globulin, Total 2.8 1.5 - 4.5 g/dL   Albumin/Globulin Ratio 1.6 1.2 - 2.2   Bilirubin Total <0.2 0.0 - 1.2 mg/dL   Alkaline Phosphatase 101 44 - 121 IU/L   AST 15 0 - 40 IU/L   ALT 12 0 - 32 IU/L  Hemoglobin A1c     Status: Abnormal   Collection Time: 06/04/22 11:07 AM  Result Value Ref Range   Hgb A1c MFr Bld 6.8 (H) 4.8 - 5.6 %    Comment:          Prediabetes: 5.7 - 6.4          Diabetes: >6.4          Glycemic control for adults with diabetes: <7.0    Est. average glucose Bld gHb Est-mCnc 148 mg/dL  POC CREATINE & ALBUMIN,URINE     Status: Abnormal   Collection Time: 06/04/22 11:22 AM  Result Value Ref Range   Microalbumin Ur, POC 150 mg/L   Creatinine, POC 300 mg/dL   Albumin/Creatinine Ratio, Urine, POC 30-300       Assessment & Plan:   Problem List Items Addressed This Visit     Hypercholesterolemia   Relevant Medications   amLODipine (NORVASC) 5 MG tablet   lisinopril (ZESTRIL) 20 MG tablet   Other Relevant Orders   Lipid panel (Completed)   Essential (primary) hypertension   Relevant Medications   amLODipine (NORVASC) 5 MG tablet   lisinopril (ZESTRIL) 20 MG tablet   Type 2 diabetes mellitus with other diabetic kidney complication (HCC) - Primary   Relevant Medications   lisinopril (ZESTRIL) 20 MG tablet   tirzepatide (MOUNJARO) 2.5 MG/0.5ML Pen   Other Relevant Orders   CBC With Differential (Completed)   CMP14+EGFR (Completed)   POC CREATINE & ALBUMIN,URINE (Completed)   Avitaminosis D   Other Visit Diagnoses     Type 2 diabetes mellitus with hyperglycemia, without long-term current use of insulin (HCC)       Relevant Medications   lisinopril (ZESTRIL) 20 MG tablet   tirzepatide (MOUNJARO) 2.5 MG/0.5ML Pen   Other Relevant Orders   POCT CBG (Fasting - Glucose) (Completed)   CBC With Differential (Completed)   CMP14+EGFR (Completed)   Hemoglobin A1c (Completed)   Primary osteoarthritis involving multiple  joints       Relevant Medications   celecoxib (CELEBREX) 200 MG capsule       Return in about 3 months (around 09/04/2022) for F/U.   Total time spent: 30 minutes  Mechele Claude, FNP  06/04/2022

## 2022-06-09 ENCOUNTER — Other Ambulatory Visit: Payer: Self-pay

## 2022-06-09 ENCOUNTER — Telehealth: Payer: Self-pay

## 2022-06-09 MED ORDER — ROSUVASTATIN CALCIUM 5 MG PO TABS
5.0000 mg | ORAL_TABLET | Freq: Every day | ORAL | 0 refills | Status: DC
  Filled 2022-06-09: qty 30, 30d supply, fill #0

## 2022-06-09 NOTE — Progress Notes (Signed)
We can try rosuvastatin.  Let her know that I'll send in the RX and see if it helps.  If so, just tell her to let us know.

## 2022-06-09 NOTE — Telephone Encounter (Signed)
Patient called requesting her lab results 

## 2022-06-09 NOTE — Addendum Note (Signed)
Addended by: Georgian Co on: 06/09/2022 02:39 PM   Modules accepted: Orders

## 2022-06-16 ENCOUNTER — Other Ambulatory Visit: Payer: Self-pay

## 2022-07-01 ENCOUNTER — Other Ambulatory Visit: Payer: Self-pay

## 2022-07-02 ENCOUNTER — Other Ambulatory Visit: Payer: Self-pay | Admitting: Internal Medicine

## 2022-07-02 DIAGNOSIS — I10 Essential (primary) hypertension: Secondary | ICD-10-CM

## 2022-08-07 ENCOUNTER — Other Ambulatory Visit: Payer: Self-pay

## 2022-08-07 ENCOUNTER — Other Ambulatory Visit: Payer: Self-pay | Admitting: Family

## 2022-08-07 MED ORDER — ROSUVASTATIN CALCIUM 5 MG PO TABS
5.0000 mg | ORAL_TABLET | Freq: Every day | ORAL | 0 refills | Status: DC
  Filled 2022-08-07: qty 30, 30d supply, fill #0

## 2022-08-12 ENCOUNTER — Other Ambulatory Visit: Payer: Self-pay

## 2022-09-05 ENCOUNTER — Other Ambulatory Visit: Payer: Self-pay | Admitting: Family

## 2022-09-05 ENCOUNTER — Other Ambulatory Visit: Payer: Self-pay

## 2022-09-05 DIAGNOSIS — I10 Essential (primary) hypertension: Secondary | ICD-10-CM

## 2022-09-07 ENCOUNTER — Other Ambulatory Visit: Payer: Self-pay

## 2022-09-08 ENCOUNTER — Other Ambulatory Visit: Payer: Self-pay

## 2022-09-09 ENCOUNTER — Other Ambulatory Visit: Payer: Self-pay

## 2022-09-09 MED FILL — Rosuvastatin Calcium Tab 5 MG: ORAL | 30 days supply | Qty: 30 | Fill #0 | Status: AC

## 2022-09-09 MED FILL — Amlodipine Besylate Tab 5 MG (Base Equivalent): ORAL | 90 days supply | Qty: 90 | Fill #0 | Status: AC

## 2022-09-10 ENCOUNTER — Other Ambulatory Visit: Payer: Self-pay

## 2022-09-29 ENCOUNTER — Other Ambulatory Visit: Payer: Self-pay | Admitting: Family

## 2022-09-29 ENCOUNTER — Other Ambulatory Visit: Payer: Self-pay

## 2022-09-29 MED FILL — Clopidogrel Bisulfate Tab 75 MG (Base Equiv): ORAL | 90 days supply | Qty: 90 | Fill #0 | Status: AC

## 2022-10-08 ENCOUNTER — Ambulatory Visit (INDEPENDENT_AMBULATORY_CARE_PROVIDER_SITE_OTHER): Payer: Commercial Managed Care - PPO | Admitting: Family

## 2022-10-08 ENCOUNTER — Encounter: Payer: Self-pay | Admitting: Family

## 2022-10-08 VITALS — BP 130/79 | HR 78 | Ht 60.0 in | Wt 119.8 lb

## 2022-10-08 DIAGNOSIS — I10 Essential (primary) hypertension: Secondary | ICD-10-CM

## 2022-10-08 DIAGNOSIS — E559 Vitamin D deficiency, unspecified: Secondary | ICD-10-CM

## 2022-10-08 DIAGNOSIS — E1129 Type 2 diabetes mellitus with other diabetic kidney complication: Secondary | ICD-10-CM | POA: Diagnosis not present

## 2022-10-08 DIAGNOSIS — E7800 Pure hypercholesterolemia, unspecified: Secondary | ICD-10-CM

## 2022-10-08 LAB — POCT CBG (FASTING - GLUCOSE)-MANUAL ENTRY: Glucose Fasting, POC: 164 mg/dL — AB (ref 70–99)

## 2022-10-08 NOTE — Assessment & Plan Note (Signed)
Checking labs today.  Will continue supplements as needed.  

## 2022-10-08 NOTE — Assessment & Plan Note (Signed)
Blood pressure well controlled with current medications.  Continue current therapy.  Will reassess at follow up.  

## 2022-10-08 NOTE — Assessment & Plan Note (Signed)
Checking labs today.  Continue current therapy for lipid control. Will modify as needed based on labwork results.  

## 2022-10-08 NOTE — Progress Notes (Signed)
Established Patient Office Visit  Subjective:  Patient ID: Lori Larsen, female    DOB: [birth year]  Age: 57 y.o. MRN: [id number]  Chief Complaint  Patient presents with   Follow-up    4 mo f/u    Patient is here today for her 3 months follow up.  She has been feeling fairly well since last appointment.   She does not have additional concerns to discuss today.  Labs are not due today. She needs refills.   I have reviewed her active problem list, medication list, allergies, notes from last encounter, lab results for her appointment today.    No other concerns at this time.   Past Medical History:  Diagnosis Date   Diabetes mellitus without complication (HCC)    History of stroke 09/27/2019   Hypertension    Stroke [facility])     Past Surgical History:  Procedure Laterality Date   CESAREAN SECTION     COLONOSCOPY WITH PROPOFOL N/A 04/18/2016   Procedure: COLONOSCOPY WITH PROPOFOL;  Surgeon: Wyline Mood, MD;  Location: [facility] ENDOSCOPY;  Service: Endoscopy;  Laterality: N/A;   ESOPHAGOGASTRODUODENOSCOPY (EGD) WITH PROPOFOL N/A 04/18/2016   Procedure: ESOPHAGOGASTRODUODENOSCOPY (EGD) WITH PROPOFOL;  Surgeon: Wyline Mood, MD;  Location: [facility] ENDOSCOPY;  Service: Endoscopy;  Laterality: N/A;   EUS N/A 06/26/2016   Procedure: FULL UPPER ENDOSCOPIC ULTRASOUND (EUS) RADIAL;  Surgeon: Bearl Mulberry, MD;  Location: [facility] ENDOSCOPY;  Service: Endoscopy;  Laterality: N/A;    Social History   Socioeconomic History   Marital status: Single    Spouse name: Not on file   Number of children: Not on file   Years of education: Not on file   Highest education level: Not on file  Occupational History   Occupation: works at [employer]  Tobacco Use   Smoking status: Some Days    Current packs/day: 0.50    Average packs/day: 0.5 packs/day for 20.0 years (10.0 ttl pk-yrs)    Types: Cigarettes   Smokeless tobacco: Never  Substance and Sexual Activity   Alcohol use: Yes     Comment:  occasionally drinks beer   Drug use: No   Sexual activity: Yes    Birth control/protection: Post-menopausal  Other Topics Concern   Not on file  Social History Narrative   Not on file   Social Determinants of Health   Financial Resource Strain: Not on file  Food Insecurity: Not on file  Transportation Needs: Not on file  Physical Activity: Not on file  Stress: Not on file  Social Connections: Not on file  Intimate Partner Violence: Not on file    Family History  Problem Relation Age of Onset   Hypertension Mother    Hypertension Father    Breast cancer Neg Hx     Allergies  Allergen Reactions   Metformin Nausea Only and Other (See Comments)    Review of Systems  All other systems reviewed and are negative.      Objective:   BP 130/79   Pulse 78   Ht 5' (1.524 m)   Wt 119 lb 12.8 oz (54.3 kg)   SpO2 97%   BMI 23.40 kg/m   Vitals:   10/08/22 0855  BP: 130/79  Pulse: 78  Height: 5' (1.524 m)  Weight: 119 lb 12.8 oz (54.3 kg)  SpO2: 97%  BMI (Calculated): 23.4    Physical Exam Vitals and nursing note reviewed.  Constitutional:      Appearance: Normal appearance. She is normal  weight.  HENT:     Head: Normocephalic and atraumatic.  Eyes:     Extraocular Movements: Extraocular movements intact.     Conjunctiva/sclera: Conjunctivae normal.     Pupils: Pupils are equal, round, and reactive to light.  Cardiovascular:     Rate and Rhythm: Normal rate.     Pulses: Normal pulses.  Pulmonary:     Effort: Pulmonary effort is normal.  Abdominal:     General: Abdomen is flat.  Neurological:     General: No focal deficit present.     Mental Status: She is alert and oriented to person, place, and time. Mental status is at baseline.  Psychiatric:        Mood and Affect: Mood normal.        Behavior: Behavior normal.        Thought Content: Thought content normal.        Judgment: Judgment normal.      Results for orders placed or performed  in visit on 10/08/22  POCT CBG (Fasting - Glucose)  Result Value Ref Range   Glucose Fasting, POC 164 (A) 70 - 99 mg/dL    Recent Results (from the past 2160 hour(s))  POCT CBG (Fasting - Glucose)     Status: Abnormal   Collection Time: 10/08/22  9:08 AM  Result Value Ref Range   Glucose Fasting, POC 164 (A) 70 - 99 mg/dL       Assessment & Plan:   Problem List Items Addressed This Visit       Active Problems   Hypercholesterolemia    Checking labs today.  Continue current therapy for lipid control. Will modify as needed based on labwork results.       Essential (primary) hypertension    Blood pressure well controlled with current medications.  Continue current therapy.  Will reassess at follow up.       Type 2 diabetes mellitus with other diabetic kidney complication (HCC) - Primary    Checking labs today. Will call pt. With results  Continue current diabetes POC, as patient has been well controlled on current regimen.  Will adjust meds if needed based on labs.       Relevant Orders   POCT CBG (Fasting - Glucose) (Completed)   Avitaminosis D    Checking labs today.  Will continue supplements as needed.        Return in about 3 months (around 01/08/2023).   Total time spent: 20 minutes  Miki Kins, FNP  10/08/2022   This document may have been prepared by [facility] Voice Recognition software and as such may include unintentional dictation errors.

## 2022-10-08 NOTE — Assessment & Plan Note (Signed)
Checking labs today. Will call pt. With results  Continue current diabetes POC, as patient has been well controlled on current regimen.  Will adjust meds if needed based on labs.  

## 2022-10-08 NOTE — Assessment & Plan Note (Signed)
>>  ASSESSMENT AND PLAN FOR HYPERCHOLESTEROLEMIA WRITTEN ON 10/08/2022  7:44 PM BY Miki Kins, FNP  Checking labs today.  Continue current therapy for lipid control. Will modify as needed based on labwork results.

## 2022-10-10 ENCOUNTER — Other Ambulatory Visit: Payer: Self-pay | Admitting: Family

## 2022-10-10 ENCOUNTER — Other Ambulatory Visit: Payer: Self-pay

## 2022-10-10 MED FILL — Rosuvastatin Calcium Tab 5 MG: ORAL | 30 days supply | Qty: 30 | Fill #0 | Status: AC

## 2022-11-04 ENCOUNTER — Ambulatory Visit (INDEPENDENT_AMBULATORY_CARE_PROVIDER_SITE_OTHER): Payer: Commercial Managed Care - PPO | Admitting: Family

## 2022-11-04 ENCOUNTER — Other Ambulatory Visit: Payer: Self-pay

## 2022-11-04 ENCOUNTER — Encounter: Payer: Self-pay | Admitting: Family

## 2022-11-04 VITALS — BP 114/69 | HR 85 | Ht 60.0 in | Wt 124.4 lb

## 2022-11-04 DIAGNOSIS — E1165 Type 2 diabetes mellitus with hyperglycemia: Secondary | ICD-10-CM

## 2022-11-04 DIAGNOSIS — E1129 Type 2 diabetes mellitus with other diabetic kidney complication: Secondary | ICD-10-CM

## 2022-11-04 DIAGNOSIS — E559 Vitamin D deficiency, unspecified: Secondary | ICD-10-CM | POA: Diagnosis not present

## 2022-11-04 DIAGNOSIS — Z0000 Encounter for general adult medical examination without abnormal findings: Secondary | ICD-10-CM | POA: Diagnosis not present

## 2022-11-04 DIAGNOSIS — L811 Chloasma: Secondary | ICD-10-CM

## 2022-11-04 DIAGNOSIS — I10 Essential (primary) hypertension: Secondary | ICD-10-CM | POA: Diagnosis not present

## 2022-11-04 MED ORDER — MOUNJARO 2.5 MG/0.5ML ~~LOC~~ SOAJ
2.5000 mg | SUBCUTANEOUS | 1 refills | Status: DC
  Filled 2022-11-04: qty 2, 28d supply, fill #0
  Filled 2022-12-01: qty 2, 28d supply, fill #1
  Filled 2022-12-29: qty 2, 28d supply, fill #2
  Filled 2023-01-26: qty 2, 28d supply, fill #3
  Filled 2023-02-23: qty 2, 28d supply, fill #4
  Filled 2023-03-23: qty 2, 28d supply, fill #5

## 2022-11-04 MED ORDER — INSULIN GLARGINE-YFGN 100 UNIT/ML ~~LOC~~ SOPN
20.0000 [IU] | PEN_INJECTOR | Freq: Every day | SUBCUTANEOUS | 1 refills | Status: DC
  Filled 2022-11-04: qty 15, 75d supply, fill #0
  Filled 2023-01-08: qty 15, 75d supply, fill #1
  Filled 2023-05-18: qty 6, 30d supply, fill #2

## 2022-11-04 MED ORDER — AZELAIC ACID 15 % EX GEL
1.0000 | Freq: Two times a day (BID) | CUTANEOUS | 0 refills | Status: DC
  Filled 2022-11-04: qty 50, 30d supply, fill #0

## 2022-11-04 MED ORDER — ROSUVASTATIN CALCIUM 5 MG PO TABS
5.0000 mg | ORAL_TABLET | Freq: Every day | ORAL | 1 refills | Status: DC
  Filled 2022-11-04: qty 90, 90d supply, fill #0
  Filled 2023-02-23: qty 90, 90d supply, fill #1

## 2022-11-04 NOTE — Progress Notes (Signed)
Complete physical exam  Patient: Lori Larsen   DOB: 03-15-1965   57 y.o. Female  MRN: [id number]  Subjective:    Chief Complaint  Patient presents with   Annual Exam    Lori Larsen is a 57 y.o. female who presents today for a complete physical exam. She reports consuming a general diet. The patient has a physically strenuous job, but has no regular exercise apart from work.  She generally feels well. She reports sleeping fairly well. She does have additional problems to discuss today.   Past Medical History:  Diagnosis Date   Diabetes mellitus without complication (HCC)    History of stroke 09/27/2019   Hypertension    Stroke [facility])     Past Surgical History:  Procedure Laterality Date   CESAREAN SECTION     COLONOSCOPY WITH PROPOFOL N/A 04/18/2016   Procedure: COLONOSCOPY WITH PROPOFOL;  Surgeon: Wyline Mood, MD;  Location: [facility] ENDOSCOPY;  Service: Endoscopy;  Laterality: N/A;   ESOPHAGOGASTRODUODENOSCOPY (EGD) WITH PROPOFOL N/A 04/18/2016   Procedure: ESOPHAGOGASTRODUODENOSCOPY (EGD) WITH PROPOFOL;  Surgeon: Wyline Mood, MD;  Location: [facility] ENDOSCOPY;  Service: Endoscopy;  Laterality: N/A;   EUS N/A 06/26/2016   Procedure: FULL UPPER ENDOSCOPIC ULTRASOUND (EUS) RADIAL;  Surgeon: Bearl Mulberry, MD;  Location: [facility] ENDOSCOPY;  Service: Endoscopy;  Laterality: N/A;    Family History  Problem Relation Age of Onset   Hypertension Mother    Hypertension Father    Breast cancer Neg Hx     Social History   Socioeconomic History   Marital status: Single    Spouse name: Not on file   Number of children: Not on file   Years of education: Not on file   Highest education level: Not on file  Occupational History   Occupation: works at [employer]  Tobacco Use   Smoking status: Some Days    Current packs/day: 0.50    Average packs/day: 0.5 packs/day for 20.0 years (10.0 ttl pk-yrs)    Types: Cigarettes   Smokeless tobacco: Never  Substance and Sexual  Activity   Alcohol use: Yes    Comment:  occasionally drinks beer   Drug use: No   Sexual activity: Yes    Birth control/protection: Post-menopausal  Other Topics Concern   Not on file  Social History Narrative   Not on file   Social Determinants of Health   Financial Resource Strain: Not on file  Food Insecurity: Not on file  Transportation Needs: Not on file  Physical Activity: Not on file  Stress: Not on file  Social Connections: Not on file  Intimate Partner Violence: Not on file    Outpatient Medications Prior to Visit  Medication Sig   amLODipine (NORVASC) 5 MG tablet Take 1 tablet (5 mg total) by mouth daily.   aspirin 325 MG tablet Take 1 tablet (325 mg total) by mouth daily.   celecoxib (CELEBREX) 200 MG capsule Take 1 capsule (200 mg total) by mouth 2 (two) times daily.   clopidogrel (PLAVIX) 75 MG tablet Take 1 tablet (75 mg total) by mouth daily.   ferrous sulfate 325 (65 FE) MG tablet Take 1 tablet by mouth 2 (two) times daily.   glipiZIDE (GLUCOTROL) 5 MG tablet Take 1 tablet (5 mg total) by mouth 2 (two) times daily.   hydrochlorothiazide (HYDRODIURIL) 25 MG tablet Take 1 tablet (25 mg total) by mouth every morning.   lisinopril (ZESTRIL) 20 MG tablet Take 1 tablet (20 mg total) by  mouth daily.   omeprazole (PRILOSEC) 40 MG capsule Take 1 capsule (40 mg total) by mouth daily.   [DISCONTINUED] NOVOFINE PLUS 32G X 4 MM MISC  (Patient not taking: Reported on 10/08/2022)   [DISCONTINUED] rosuvastatin (CRESTOR) 5 MG tablet Take 1 tablet (5 mg total) by mouth daily.   [DISCONTINUED] tirzepatide [facility] 2.5 MG/0.5ML Pen Inject 2.5 mg into the skin once a week.   [DISCONTINUED] TRESIBA FLEXTOUCH 100 UNIT/ML SOPN FlexTouch Pen Inject 24 Units into the skin every morning.  (Patient not taking: Reported on 10/08/2022)   No facility-administered medications prior to visit.    Review of Systems  All other systems reviewed and are negative.       Objective:     BP  114/69   Pulse 85   Ht 5' (1.524 m)   Wt 124 lb 6.4 oz (56.4 kg)   SpO2 97%   BMI 24.30 kg/m    Physical Exam Vitals and nursing note reviewed.  Constitutional:      Appearance: Normal appearance. She is normal weight.  HENT:     Head: Normocephalic and atraumatic.  Eyes:     Extraocular Movements: Extraocular movements intact.     Conjunctiva/sclera: Conjunctivae normal.     Pupils: Pupils are equal, round, and reactive to light.  Cardiovascular:     Rate and Rhythm: Normal rate and regular rhythm.     Pulses: Normal pulses.  Pulmonary:     Effort: Pulmonary effort is normal.  Musculoskeletal:        General: Normal range of motion.  Neurological:     General: No focal deficit present.     Mental Status: She is alert and oriented to person, place, and time. Mental status is at baseline.  Psychiatric:        Mood and Affect: Mood normal.        Behavior: Behavior normal.        Thought Content: Thought content normal.        Judgment: Judgment normal.      No results found for any visits on 11/04/22.  Recent Results (from the past 2160 hour(s))  POCT CBG (Fasting - Glucose)     Status: Abnormal   Collection Time: 10/08/22  9:08 AM  Result Value Ref Range   Glucose Fasting, POC 164 (A) 70 - 99 mg/dL        Assessment & Plan:    Routine Health Maintenance and Physical Exam  Immunization History  Administered Date(s) Administered   Influenza Split 12/06/2010   PFIZER(Purple Top)SARS-COV-2 Vaccination 06/09/2019, 07/06/2019   Tdap 12/06/2010    Health Maintenance  Topic Date Due   FOOT EXAM  Never done   OPHTHALMOLOGY EXAM  Never done   HIV Screening  Never done   Hepatitis C Screening  Never done   PAP SMEAR-Modifier  Never done   Zoster Vaccines- Shingrix (1 of 2) Never done   DTaP/Tdap/Td (2 - Td or Tdap) 12/05/2020   Colonoscopy  04/18/2021   COVID-19 Vaccine (3 - 2023-24 season) 11/09/2022   INFLUENZA VACCINE  01/08/2023 (Originally 10/09/2022)    HEMOGLOBIN A1C  12/05/2022   Diabetic kidney evaluation - eGFR measurement  06/04/2023   Diabetic kidney evaluation - Urine ACR  06/04/2023   MAMMOGRAM  03/26/2024   HPV VACCINES  Aged Out    Discussed health benefits of physical activity, and encouraged her to engage in regular exercise appropriate for her age and condition.  Problem List Items Addressed This  Visit       Active Problems   Essential (primary) hypertension    Blood pressure well controlled with current medications.  Continue current therapy.  Will reassess at follow up.        Relevant Medications   rosuvastatin (CRESTOR) 5 MG tablet   Type 2 diabetes mellitus with other diabetic kidney complication (HCC)    Checking labs today. Will call pt. With results  Continue current diabetes POC, as patient has been well controlled on current regimen.  Will adjust meds if needed based on labs.        Relevant Medications   insulin glargine-yfgn (SEMGLEE) 100 UNIT/ML Pen   tirzepatide (MOUNJARO) 2.5 MG/0.5ML Pen   rosuvastatin (CRESTOR) 5 MG tablet   Avitaminosis D   Melasma    Sending azelaic acid for pt to try.  Patient will let me know if this does not help.   Reassess at follow up.       Other Visit Diagnoses     Routine general medical examination at a health care facility    -  Primary   Type 2 diabetes mellitus with hyperglycemia, without long-term current use of insulin (HCC)       Relevant Medications   insulin glargine-yfgn (SEMGLEE) 100 UNIT/ML Pen   tirzepatide (MOUNJARO) 2.5 MG/0.5ML Pen   rosuvastatin (CRESTOR) 5 MG tablet      Return in about 3 months (around 02/04/2023).      Miki Kins, FNP  11/04/2022   This document may have been prepared by [facility] Voice Recognition software and as such may include unintentional dictation errors.

## 2022-11-05 ENCOUNTER — Other Ambulatory Visit: Payer: Self-pay

## 2022-11-06 ENCOUNTER — Telehealth: Payer: Self-pay | Admitting: Family

## 2022-11-06 ENCOUNTER — Other Ambulatory Visit: Payer: Self-pay

## 2022-11-06 MED ORDER — NOVOFINE PLUS PEN NEEDLE 32G X 4 MM MISC
3 refills | Status: DC
  Filled 2022-11-06: qty 100, 90d supply, fill #0

## 2022-11-06 NOTE — Telephone Encounter (Signed)
Pen needle have been sent

## 2022-11-06 NOTE — Telephone Encounter (Signed)
Patient left VM needing pen needles for her Semglee, states the ones she has do not fit.

## 2022-11-09 ENCOUNTER — Encounter: Payer: Self-pay | Admitting: Family

## 2022-11-09 DIAGNOSIS — L811 Chloasma: Secondary | ICD-10-CM | POA: Insufficient documentation

## 2022-11-09 NOTE — Assessment & Plan Note (Signed)
Blood pressure well controlled with current medications.  Continue current therapy.  Will reassess at follow up.  

## 2022-11-09 NOTE — Assessment & Plan Note (Signed)
Sending azelaic acid for pt to try.  Patient will let me know if this does not help.   Reassess at follow up.

## 2022-11-09 NOTE — Assessment & Plan Note (Signed)
Checking labs today. Will call pt. With results  Continue current diabetes POC, as patient has been well controlled on current regimen.  Will adjust meds if needed based on labs.  

## 2022-11-18 ENCOUNTER — Other Ambulatory Visit: Payer: Self-pay

## 2022-12-01 ENCOUNTER — Other Ambulatory Visit: Payer: Self-pay

## 2022-12-01 ENCOUNTER — Other Ambulatory Visit: Payer: Self-pay | Admitting: Family

## 2022-12-01 DIAGNOSIS — M159 Polyosteoarthritis, unspecified: Secondary | ICD-10-CM

## 2022-12-01 MED FILL — Amlodipine Besylate Tab 5 MG (Base Equivalent): ORAL | 90 days supply | Qty: 90 | Fill #1 | Status: AC

## 2022-12-02 ENCOUNTER — Other Ambulatory Visit: Payer: Self-pay

## 2022-12-02 MED ORDER — CELECOXIB 200 MG PO CAPS
200.0000 mg | ORAL_CAPSULE | Freq: Two times a day (BID) | ORAL | 1 refills | Status: DC
Start: 2022-12-02 — End: 2023-06-16
  Filled 2022-12-02: qty 180, 90d supply, fill #0
  Filled 2023-02-23: qty 180, 90d supply, fill #1

## 2022-12-02 MED FILL — Omeprazole Cap Delayed Release 40 MG: ORAL | 90 days supply | Qty: 90 | Fill #0 | Status: AC

## 2022-12-02 MED FILL — Hydrochlorothiazide Tab 25 MG: ORAL | 90 days supply | Qty: 90 | Fill #0 | Status: AC

## 2022-12-02 MED FILL — Glipizide Tab 5 MG: ORAL | 90 days supply | Qty: 180 | Fill #0 | Status: AC

## 2022-12-29 ENCOUNTER — Other Ambulatory Visit: Payer: Self-pay

## 2022-12-29 MED FILL — Clopidogrel Bisulfate Tab 75 MG (Base Equiv): ORAL | 90 days supply | Qty: 90 | Fill #1 | Status: AC

## 2023-01-07 ENCOUNTER — Other Ambulatory Visit: Payer: Self-pay

## 2023-01-07 ENCOUNTER — Ambulatory Visit (INDEPENDENT_AMBULATORY_CARE_PROVIDER_SITE_OTHER): Payer: Commercial Managed Care - PPO | Admitting: Family

## 2023-01-07 ENCOUNTER — Encounter: Payer: Self-pay | Admitting: Family

## 2023-01-07 VITALS — BP 110/82 | HR 55 | Ht 60.0 in | Wt 127.0 lb

## 2023-01-07 DIAGNOSIS — E1129 Type 2 diabetes mellitus with other diabetic kidney complication: Secondary | ICD-10-CM | POA: Diagnosis not present

## 2023-01-07 DIAGNOSIS — Z1159 Encounter for screening for other viral diseases: Secondary | ICD-10-CM | POA: Diagnosis not present

## 2023-01-07 DIAGNOSIS — E538 Deficiency of other specified B group vitamins: Secondary | ICD-10-CM | POA: Diagnosis not present

## 2023-01-07 DIAGNOSIS — E1165 Type 2 diabetes mellitus with hyperglycemia: Secondary | ICD-10-CM | POA: Diagnosis not present

## 2023-01-07 DIAGNOSIS — E782 Mixed hyperlipidemia: Secondary | ICD-10-CM

## 2023-01-07 DIAGNOSIS — Z114 Encounter for screening for human immunodeficiency virus [HIV]: Secondary | ICD-10-CM

## 2023-01-07 DIAGNOSIS — E559 Vitamin D deficiency, unspecified: Secondary | ICD-10-CM

## 2023-01-07 DIAGNOSIS — I10 Essential (primary) hypertension: Secondary | ICD-10-CM | POA: Diagnosis not present

## 2023-01-07 LAB — POCT CBG (FASTING - GLUCOSE)-MANUAL ENTRY: Glucose Fasting, POC: 164 mg/dL — AB (ref 70–99)

## 2023-01-07 MED ORDER — NOVOFINE PLUS PEN NEEDLE 32G X 4 MM MISC
3 refills | Status: AC
Start: 2023-01-07 — End: ?
  Filled 2023-01-07 – 2023-01-14 (×2): qty 100, 90d supply, fill #0
  Filled 2023-05-18: qty 100, 90d supply, fill #1

## 2023-01-07 NOTE — Progress Notes (Signed)
Established Patient Office Visit  Subjective:  Patient ID: Lori Larsen, female    DOB: 1965/10/18  Age: 57 y.o. MRN: [id number]  Chief Complaint  Patient presents with   Follow-up    3 month follow up    Patient is here today for her 3 months follow up.  She has been feeling well since last appointment.   She does not have additional concerns to discuss today.  Labs are due today. She needs refills.   I have reviewed her active problem list, medication list, allergies, notes from last encounter, lab results, imaging for her appointment today.    No other concerns at this time.   Past Medical History:  Diagnosis Date   Diabetes mellitus without complication (HCC)    History of stroke 09/27/2019   Hypertension    Stroke (HCC)    Syncope 03/18/2016    Past Surgical History:  Procedure Laterality Date   CESAREAN SECTION     COLONOSCOPY WITH PROPOFOL N/A 04/18/2016   Procedure: COLONOSCOPY WITH PROPOFOL;  Surgeon: Wyline Mood, MD;  Location: [facility] ENDOSCOPY;  Service: Endoscopy;  Laterality: N/A;   ESOPHAGOGASTRODUODENOSCOPY (EGD) WITH PROPOFOL N/A 04/18/2016   Procedure: ESOPHAGOGASTRODUODENOSCOPY (EGD) WITH PROPOFOL;  Surgeon: Wyline Mood, MD;  Location: [facility] ENDOSCOPY;  Service: Endoscopy;  Laterality: N/A;   EUS N/A 06/26/2016   Procedure: FULL UPPER ENDOSCOPIC ULTRASOUND (EUS) RADIAL;  Surgeon: Bearl Mulberry, MD;  Location: [facility] ENDOSCOPY;  Service: Endoscopy;  Laterality: N/A;    Social History   Socioeconomic History   Marital status: Single    Spouse name: Not on file   Number of children: Not on file   Years of education: Not on file   Highest education level: Not on file  Occupational History   Occupation: works at [employer]  Tobacco Use   Smoking status: Some Days    Current packs/day: 0.50    Average packs/day: 0.5 packs/day for 20.0 years (10.0 ttl pk-yrs)    Types: Cigarettes   Smokeless tobacco: Never  Substance and Sexual Activity    Alcohol use: Yes    Comment:  occasionally drinks beer   Drug use: No   Sexual activity: Yes    Birth control/protection: Post-menopausal  Other Topics Concern   Not on file  Social History Narrative   Not on file   Social Determinants of Health   Financial Resource Strain: Not on file  Food Insecurity: Not on file  Transportation Needs: Not on file  Physical Activity: Not on file  Stress: Not on file  Social Connections: Not on file  Intimate Partner Violence: Not on file    Family History  Problem Relation Age of Onset   Hypertension Mother    Hypertension Father    Breast cancer Neg Hx     Allergies  Allergen Reactions   Metformin Nausea Only and Other (See Comments)    Review of Systems  All other systems reviewed and are negative.      Objective:   BP 110/82   Pulse (!) 55   Ht 5' (1.524 m)   Wt 127 lb (57.6 kg)   SpO2 100%   BMI 24.80 kg/m   Vitals:   01/07/23 0908  BP: 110/82  Pulse: (!) 55  Height: 5' (1.524 m)  Weight: 127 lb (57.6 kg)  SpO2: 100%  BMI (Calculated): 24.8    Physical Exam Vitals and nursing note reviewed.  Constitutional:      Appearance: Normal appearance. She  is normal weight.  HENT:     Head: Normocephalic.  Eyes:     Extraocular Movements: Extraocular movements intact.     Conjunctiva/sclera: Conjunctivae normal.     Pupils: Pupils are equal, round, and reactive to light.  Cardiovascular:     Rate and Rhythm: Normal rate.  Pulmonary:     Effort: Pulmonary effort is normal.  Neurological:     General: No focal deficit present.     Mental Status: She is alert and oriented to person, place, and time. Mental status is at baseline.  Psychiatric:        Mood and Affect: Mood normal.        Behavior: Behavior normal.        Thought Content: Thought content normal.        Judgment: Judgment normal.      Results for orders placed or performed in visit on 01/07/23  POCT CBG (Fasting - Glucose)  Result Value  Ref Range   Glucose Fasting, POC 164 (A) 70 - 99 mg/dL    Recent Results (from the past 2160 hour(s))  POCT CBG (Fasting - Glucose)     Status: Abnormal   Collection Time: 01/07/23  9:13 AM  Result Value Ref Range   Glucose Fasting, POC 164 (A) 70 - 99 mg/dL       Assessment & Plan:   Problem List Items Addressed This Visit       Active Problems   Essential (primary) hypertension    Blood pressure well controlled with current medications.  Continue current therapy.  Will reassess at follow up.       Relevant Orders   CMP14+EGFR   CBC with Diff   Type 2 diabetes mellitus with other diabetic kidney complication (HCC)    Checking labs today. Will call pt. With results  Continue current diabetes POC, as patient has been well controlled on current regimen.  Will adjust meds if needed based on labs.       Relevant Orders   CMP14+EGFR   Hemoglobin A1c   CBC with Diff   Vitamin D deficiency, unspecified    Checking labs today.  Will continue supplements as needed.        Relevant Orders   VITAMIN D 25 Hydroxy (Vit-D Deficiency, Fractures)   CMP14+EGFR   CBC with Diff   Mixed hyperlipidemia    Checking labs today.  Continue current therapy for lipid control. Will modify as needed based on labwork results.       Relevant Orders   Lipid panel   CMP14+EGFR   CBC with Diff   Other Visit Diagnoses     Type 2 diabetes mellitus with hyperglycemia, without long-term current use of insulin (HCC)    -  Primary   Relevant Orders   POCT CBG (Fasting - Glucose) (Completed)   CMP14+EGFR   Hemoglobin A1c   CBC with Diff   Screening for HIV without presence of risk factors       Test ordered in office today. Will call with results.   Relevant Orders   CMP14+EGFR   CBC with Diff   HIV antibody (with reflex)   Need for hepatitis C screening test       Test ordered in office today. Will call with results.   Relevant Orders   CMP14+EGFR   CBC with Diff   Hepatitis C  Ab reflex to Quant PCR   B12 deficiency due to diet  Checking labs today.  Will continue supplements as needed.   Relevant Orders   CMP14+EGFR   Vitamin B12   CBC with Diff       Return in about 4 months (around 05/08/2023) for F/U.   Total time spent: 20 minutes  Miki Kins, FNP  01/07/2023   This document may have been prepared by [facility] Voice Recognition software and as such may include unintentional dictation errors.

## 2023-01-07 NOTE — Assessment & Plan Note (Addendum)
Checking labs today.  Continue current therapy for lipid control. Will modify as needed based on labwork results.  

## 2023-01-07 NOTE — Assessment & Plan Note (Signed)
Checking labs today. Will call pt. With results  Continue current diabetes POC, as patient has been well controlled on current regimen.  Will adjust meds if needed based on labs.  

## 2023-01-07 NOTE — Assessment & Plan Note (Signed)
Blood pressure well controlled with current medications.  Continue current therapy.  Will reassess at follow up.  

## 2023-01-07 NOTE — Assessment & Plan Note (Signed)
Checking labs today.  Will continue supplements as needed.  

## 2023-01-08 LAB — CBC WITH DIFFERENTIAL/PLATELET
Basophils Absolute: 0.1 10*3/uL (ref 0.0–0.2)
Basos: 1 %
EOS (ABSOLUTE): 0.3 10*3/uL (ref 0.0–0.4)
Eos: 4 %
Hematocrit: 35.5 % (ref 34.0–46.6)
Hemoglobin: 10.9 g/dL — ABNORMAL LOW (ref 11.1–15.9)
Immature Grans (Abs): 0 10*3/uL (ref 0.0–0.1)
Immature Granulocytes: 0 %
Lymphocytes Absolute: 2.4 10*3/uL (ref 0.7–3.1)
Lymphs: 33 %
MCH: 29.4 pg (ref 26.6–33.0)
MCHC: 30.7 g/dL — ABNORMAL LOW (ref 31.5–35.7)
MCV: 96 fL (ref 79–97)
Monocytes Absolute: 0.5 10*3/uL (ref 0.1–0.9)
Monocytes: 6 %
Neutrophils Absolute: 4.1 10*3/uL (ref 1.4–7.0)
Neutrophils: 56 %
Platelets: 282 10*3/uL (ref 150–450)
RBC: 3.71 x10E6/uL — ABNORMAL LOW (ref 3.77–5.28)
RDW: 12.8 % (ref 11.7–15.4)
WBC: 7.4 10*3/uL (ref 3.4–10.8)

## 2023-01-08 LAB — CMP14+EGFR
ALT: 14 [IU]/L (ref 0–32)
AST: 19 [IU]/L (ref 0–40)
Albumin: 4.5 g/dL (ref 3.8–4.9)
Alkaline Phosphatase: 93 [IU]/L (ref 44–121)
BUN/Creatinine Ratio: 22 (ref 9–23)
BUN: 26 mg/dL — ABNORMAL HIGH (ref 6–24)
Bilirubin Total: <0.2 mg/dL (ref 0.0–1.2)
CO2: 23 mmol/L (ref 20–29)
Calcium: 9.8 mg/dL (ref 8.7–10.2)
Chloride: 104 mmol/L (ref 96–106)
Creatinine, Ser: 1.16 mg/dL — ABNORMAL HIGH (ref 0.57–1.00)
Globulin, Total: 2.8 g/dL (ref 1.5–4.5)
Glucose: 115 mg/dL — ABNORMAL HIGH (ref 70–99)
Potassium: 4.2 mmol/L (ref 3.5–5.2)
Sodium: 142 mmol/L (ref 134–144)
Total Protein: 7.3 g/dL (ref 6.0–8.5)
eGFR: 55 mL/min/{1.73_m2} — ABNORMAL LOW (ref >59)

## 2023-01-08 LAB — LIPID PANEL
Chol/HDL Ratio: 3.4 ratio (ref 0.0–4.4)
Cholesterol, Total: 160 mg/dL (ref 100–199)
HDL: 47 mg/dL (ref >39)
LDL Chol Calc (NIH): 84 mg/dL (ref 0–99)
Triglycerides: 171 mg/dL — ABNORMAL HIGH (ref 0–149)
VLDL Cholesterol Cal: 29 mg/dL (ref 5–40)

## 2023-01-08 LAB — VITAMIN D 25 HYDROXY (VIT D DEFICIENCY, FRACTURES): Vit D, 25-Hydroxy: 35.6 ng/mL (ref 30.0–100.0)

## 2023-01-08 LAB — VITAMIN B12: Vitamin B-12: 363 pg/mL (ref 232–1245)

## 2023-01-08 LAB — HEMOGLOBIN A1C
Est. average glucose Bld gHb Est-mCnc: 146 mg/dL
Hgb A1c MFr Bld: 6.7 % — ABNORMAL HIGH (ref 4.8–5.6)

## 2023-01-08 LAB — HCV INTERPRETATION

## 2023-01-08 LAB — HCV AB W REFLEX TO QUANT PCR: HCV Ab: NONREACTIVE

## 2023-01-08 LAB — HIV ANTIBODY (ROUTINE TESTING W REFLEX): HIV Screen 4th Generation wRfx: NONREACTIVE

## 2023-01-09 LAB — IRON AND TIBC
Iron Saturation: 22 % (ref 15–55)
Iron: 63 ug/dL (ref 27–159)
Total Iron Binding Capacity: 286 ug/dL (ref 250–450)
UIBC: 223 ug/dL (ref 131–425)

## 2023-01-09 LAB — FERRITIN, SERUM (SERIAL): Ferritin: 95 ng/mL (ref 15–150)

## 2023-01-09 LAB — SPECIMEN STATUS REPORT

## 2023-01-14 ENCOUNTER — Other Ambulatory Visit: Payer: Self-pay

## 2023-01-26 ENCOUNTER — Other Ambulatory Visit: Payer: Self-pay

## 2023-02-23 ENCOUNTER — Other Ambulatory Visit: Payer: Self-pay | Admitting: Family

## 2023-02-23 ENCOUNTER — Other Ambulatory Visit: Payer: Self-pay

## 2023-02-23 MED ORDER — CLOPIDOGREL BISULFATE 75 MG PO TABS
75.0000 mg | ORAL_TABLET | Freq: Every day | ORAL | 1 refills | Status: DC
  Filled 2023-02-23 – 2023-03-23 (×2): qty 90, 90d supply, fill #0
  Filled 2023-07-13 (×2): qty 90, 90d supply, fill #1

## 2023-02-23 MED FILL — Omeprazole Cap Delayed Release 40 MG: ORAL | 90 days supply | Qty: 90 | Fill #1 | Status: AC

## 2023-02-23 MED FILL — Hydrochlorothiazide Tab 25 MG: ORAL | 90 days supply | Qty: 90 | Fill #1 | Status: AC

## 2023-02-23 MED FILL — Glipizide Tab 5 MG: ORAL | 90 days supply | Qty: 180 | Fill #1 | Status: AC

## 2023-02-23 MED FILL — Amlodipine Besylate Tab 5 MG (Base Equivalent): ORAL | 90 days supply | Qty: 90 | Fill #2 | Status: AC

## 2023-02-25 ENCOUNTER — Other Ambulatory Visit: Payer: Self-pay

## 2023-03-16 ENCOUNTER — Other Ambulatory Visit: Payer: Self-pay | Admitting: Family

## 2023-03-16 DIAGNOSIS — Z1231 Encounter for screening mammogram for malignant neoplasm of breast: Secondary | ICD-10-CM

## 2023-03-23 ENCOUNTER — Other Ambulatory Visit: Payer: Self-pay

## 2023-03-30 ENCOUNTER — Ambulatory Visit
Admission: RE | Admit: 2023-03-30 | Discharge: 2023-03-30 | Disposition: A | Discharge status: Home / Self Care | Payer: Commercial Managed Care - PPO | Source: Ambulatory Visit | Attending: Family | Admitting: Family

## 2023-03-30 DIAGNOSIS — Z1231 Encounter for screening mammogram for malignant neoplasm of breast: Secondary | ICD-10-CM | POA: Insufficient documentation

## 2023-04-20 ENCOUNTER — Other Ambulatory Visit: Payer: Self-pay

## 2023-04-20 ENCOUNTER — Other Ambulatory Visit: Payer: Self-pay | Admitting: Family

## 2023-04-20 DIAGNOSIS — E1129 Type 2 diabetes mellitus with other diabetic kidney complication: Secondary | ICD-10-CM

## 2023-04-21 ENCOUNTER — Other Ambulatory Visit: Payer: Self-pay

## 2023-04-21 MED FILL — Tirzepatide Soln Auto-injector 2.5 MG/0.5ML: SUBCUTANEOUS | 28 days supply | Qty: 2 | Fill #0 | Status: AC

## 2023-04-28 ENCOUNTER — Encounter: Payer: Self-pay | Admitting: Family

## 2023-04-28 ENCOUNTER — Ambulatory Visit: Payer: Commercial Managed Care - PPO | Admitting: Family

## 2023-04-28 VITALS — BP 132/80 | HR 75 | Wt 126.0 lb

## 2023-04-28 DIAGNOSIS — I10 Essential (primary) hypertension: Secondary | ICD-10-CM | POA: Diagnosis not present

## 2023-04-28 DIAGNOSIS — E1165 Type 2 diabetes mellitus with hyperglycemia: Secondary | ICD-10-CM | POA: Diagnosis not present

## 2023-04-28 DIAGNOSIS — E782 Mixed hyperlipidemia: Secondary | ICD-10-CM

## 2023-04-28 DIAGNOSIS — E1129 Type 2 diabetes mellitus with other diabetic kidney complication: Secondary | ICD-10-CM

## 2023-04-28 DIAGNOSIS — E559 Vitamin D deficiency, unspecified: Secondary | ICD-10-CM | POA: Diagnosis not present

## 2023-04-28 LAB — POCT CBG (FASTING - GLUCOSE)-MANUAL ENTRY: Glucose Fasting, POC: 207 mg/dL — AB (ref 70–99)

## 2023-04-28 NOTE — Assessment & Plan Note (Signed)
 Checking labs today. Will call pt. With results  Continue current diabetes POC, as patient has been well controlled on current regimen.  Will adjust meds if needed based on labs.

## 2023-04-28 NOTE — Assessment & Plan Note (Signed)
 Checking labs today.  Will continue supplements as needed.

## 2023-04-28 NOTE — Assessment & Plan Note (Signed)
 Blood pressure well controlled with current medications.  Continue current therapy.  Will reassess at follow up.

## 2023-04-28 NOTE — Progress Notes (Signed)
Established Patient Office Visit  Subjective:  Patient ID: Lori Larsen, female    DOB: January 04, 1966  Age: 58 y.o. MRN: [id number]  Chief Complaint  Patient presents with   Follow-up    3 month follow up     Patient is here today for her 3 months follow up.  She has been feeling well since last appointment.   She does have additional concerns to discuss today.   Labs are not due today. She needs refills.   I have reviewed her active problem list, medication list, allergies, health maintenance, notes from last encounter, lab results for her appointment today.    No other concerns at this time.   Past Medical History:  Diagnosis Date   Diabetes mellitus without complication (HCC)    History of stroke 09/27/2019   Hypertension    Stroke (HCC)    Syncope 03/18/2016    Past Surgical History:  Procedure Laterality Date   CESAREAN SECTION     COLONOSCOPY WITH PROPOFOL N/A 04/18/2016   Procedure: COLONOSCOPY WITH PROPOFOL;  Surgeon: Wyline Mood, MD;  Location: [facility];  Service: Endoscopy;  Laterality: N/A;   ESOPHAGOGASTRODUODENOSCOPY (EGD) WITH PROPOFOL N/A 04/18/2016   Procedure: ESOPHAGOGASTRODUODENOSCOPY (EGD) WITH PROPOFOL;  Surgeon: Wyline Mood, MD;  Location: [facility] ENDOSCOPY;  Service: Endoscopy;  Laterality: N/A;   EUS N/A 06/26/2016   Procedure: FULL UPPER ENDOSCOPIC ULTRASOUND (EUS) RADIAL;  Surgeon: Bearl Mulberry, MD;  Location: [facility] ENDOSCOPY;  Service: Endoscopy;  Laterality: N/A;    Social History   Socioeconomic History   Marital status: Single    Spouse name: Not on file   Number of children: Not on file   Years of education: Not on file   Highest education level: Not on file  Occupational History   Occupation: works at [employer]  Tobacco Use   Smoking status: Some Days    Current packs/day: 0.50    Average packs/day: 0.5 packs/day for 20.0 years (10.0 ttl pk-yrs)    Types: Cigarettes   Smokeless tobacco: Never  Substance and  Sexual Activity   Alcohol use: Yes    Comment:  occasionally drinks beer   Drug use: No   Sexual activity: Yes    Birth control/protection: Post-menopausal  Other Topics Concern   Not on file  Social History Narrative   Not on file   Social Drivers of Health   Financial Resource Strain: Not on file  Food Insecurity: Not on file  Transportation Needs: Not on file  Physical Activity: Not on file  Stress: Not on file  Social Connections: Not on file  Intimate Partner Violence: Not on file    Family History  Problem Relation Age of Onset   Hypertension Mother    Hypertension Father    Breast cancer Neg Hx     Allergies  Allergen Reactions   Metformin Nausea Only and Other (See Comments)    Review of Systems  All other systems reviewed and are negative.      Objective:   BP 132/80   Pulse 75   Wt 126 lb (57.2 kg)   SpO2 98%   BMI 24.61 kg/m   Vitals:   04/28/23 1019  BP: 132/80  Pulse: 75  Weight: 126 lb (57.2 kg)  SpO2: 98%    Physical Exam Vitals and nursing note reviewed.  Constitutional:      Appearance: Normal appearance. She is normal weight.  HENT:     Head: Normocephalic.  Eyes:  Extraocular Movements: Extraocular movements intact.     Conjunctiva/sclera: Conjunctivae normal.     Pupils: Pupils are equal, round, and reactive to light.  Cardiovascular:     Rate and Rhythm: Normal rate and regular rhythm.  Pulmonary:     Effort: Pulmonary effort is normal.  Neurological:     General: No focal deficit present.     Mental Status: She is alert and oriented to person, place, and time. Mental status is at baseline.  Psychiatric:        Mood and Affect: Mood normal.        Behavior: Behavior normal.        Thought Content: Thought content normal.        Judgment: Judgment normal.      Results for orders placed or performed in visit on 04/28/23  POCT CBG (Fasting - Glucose)  Result Value Ref Range   Glucose Fasting, POC 207 (A) 70 -  99 mg/dL    Recent Results (from the past 2160 hours)  POCT CBG (Fasting - Glucose)     Status: Abnormal   Collection Time: 04/28/23 10:22 AM  Result Value Ref Range   Glucose Fasting, POC 207 (A) 70 - 99 mg/dL       Assessment & Plan:   Problem List Items Addressed This Visit       Cardiovascular and Mediastinum   Essential (primary) hypertension   Blood pressure well controlled with current medications.  Continue current therapy.  Will reassess at follow up.         Endocrine   Type 2 diabetes mellitus with other diabetic kidney complication (HCC)   Checking labs today. Will call pt. With results  Continue current diabetes POC, as patient has been well controlled on current regimen.  Will adjust meds if needed based on labs.         Other   Vitamin D deficiency, unspecified   Checking labs today.  Will continue supplements as needed.        Mixed hyperlipidemia   Checking labs today.  Continue current therapy for lipid control. Will modify as needed based on labwork results.       Other Visit Diagnoses       Type 2 diabetes mellitus with hyperglycemia, without long-term current use of insulin (HCC)    -  Primary   Relevant Orders   POCT CBG (Fasting - Glucose) (Completed)       Return in about 3 months (around 07/26/2023) for F/U.   Total time spent: 20 minutes  Miki Kins, FNP  04/28/2023   This document may have been prepared by [facility] Voice Recognition software and as such may include unintentional dictation errors.

## 2023-04-28 NOTE — Assessment & Plan Note (Signed)
 Checking labs today.  Continue current therapy for lipid control. Will modify as needed based on labwork results.

## 2023-05-18 ENCOUNTER — Other Ambulatory Visit: Payer: Self-pay | Admitting: Family

## 2023-05-18 ENCOUNTER — Other Ambulatory Visit: Payer: Self-pay

## 2023-05-18 DIAGNOSIS — I10 Essential (primary) hypertension: Secondary | ICD-10-CM

## 2023-05-18 MED ORDER — HYDROCHLOROTHIAZIDE 25 MG PO TABS
25.0000 mg | ORAL_TABLET | Freq: Every morning | ORAL | 1 refills | Status: DC
  Filled 2023-05-18: qty 90, 90d supply, fill #0
  Filled 2023-09-07: qty 90, 90d supply, fill #1

## 2023-05-18 MED ORDER — OMEPRAZOLE 40 MG PO CPDR
40.0000 mg | DELAYED_RELEASE_CAPSULE | Freq: Every day | ORAL | 1 refills | Status: DC
  Filled 2023-05-18: qty 90, 90d supply, fill #0
  Filled 2023-09-07: qty 90, 90d supply, fill #1

## 2023-05-18 MED ORDER — GLIPIZIDE 5 MG PO TABS
5.0000 mg | ORAL_TABLET | Freq: Two times a day (BID) | ORAL | 1 refills | Status: DC
  Filled 2023-05-18: qty 180, 90d supply, fill #0
  Filled 2023-09-07: qty 180, 90d supply, fill #1

## 2023-05-18 MED ORDER — LISINOPRIL 20 MG PO TABS
20.0000 mg | ORAL_TABLET | Freq: Every day | ORAL | 3 refills | Status: AC
  Filled 2023-05-18: qty 90, 90d supply, fill #0
  Filled 2023-09-07: qty 90, 90d supply, fill #1
  Filled 2023-12-28: qty 90, 90d supply, fill #2
  Filled 2024-03-29 – 2024-04-04 (×3): qty 90, 90d supply, fill #0

## 2023-05-18 MED ORDER — ROSUVASTATIN CALCIUM 5 MG PO TABS
5.0000 mg | ORAL_TABLET | Freq: Every day | ORAL | 1 refills | Status: DC
  Filled 2023-05-18: qty 90, 90d supply, fill #0
  Filled 2023-09-07: qty 90, 90d supply, fill #1

## 2023-05-18 MED FILL — Amlodipine Besylate Tab 5 MG (Base Equivalent): ORAL | 90 days supply | Qty: 90 | Fill #3 | Status: AC

## 2023-05-18 MED FILL — Tirzepatide Soln Auto-injector 2.5 MG/0.5ML: SUBCUTANEOUS | 28 days supply | Qty: 2 | Fill #1 | Status: AC

## 2023-06-15 ENCOUNTER — Other Ambulatory Visit: Payer: Self-pay | Admitting: Family

## 2023-06-15 ENCOUNTER — Other Ambulatory Visit: Payer: Self-pay

## 2023-06-15 DIAGNOSIS — M150 Primary generalized (osteo)arthritis: Secondary | ICD-10-CM

## 2023-06-15 MED FILL — Tirzepatide Soln Auto-injector 2.5 MG/0.5ML: SUBCUTANEOUS | 28 days supply | Qty: 2 | Fill #2 | Status: AC

## 2023-06-16 ENCOUNTER — Other Ambulatory Visit: Payer: Self-pay

## 2023-06-16 MED FILL — Celecoxib Cap 200 MG: ORAL | 90 days supply | Qty: 180 | Fill #0 | Status: AC

## 2023-06-24 ENCOUNTER — Ambulatory Visit: Admitting: Family

## 2023-06-24 ENCOUNTER — Other Ambulatory Visit: Payer: Self-pay

## 2023-06-24 ENCOUNTER — Other Ambulatory Visit (HOSPITAL_COMMUNITY): Payer: Self-pay

## 2023-06-24 VITALS — BP 112/78 | HR 88 | Ht 60.0 in | Wt 130.6 lb

## 2023-06-24 DIAGNOSIS — K529 Noninfective gastroenteritis and colitis, unspecified: Secondary | ICD-10-CM | POA: Diagnosis not present

## 2023-06-24 DIAGNOSIS — J069 Acute upper respiratory infection, unspecified: Secondary | ICD-10-CM

## 2023-06-24 DIAGNOSIS — E1165 Type 2 diabetes mellitus with hyperglycemia: Secondary | ICD-10-CM

## 2023-06-24 DIAGNOSIS — Z0130 Encounter for examination of blood pressure without abnormal findings: Secondary | ICD-10-CM

## 2023-06-24 LAB — GLUCOSE, POCT (MANUAL RESULT ENTRY): POC Glucose: 229 mg/dL — AB (ref 70–99)

## 2023-06-24 LAB — POCT XPERT XPRESS SARS COVID-2/FLU/RSV
FLU A: NEGATIVE
FLU B: NEGATIVE
RSV RNA, PCR: NEGATIVE
SARS Coronavirus 2: NEGATIVE

## 2023-06-24 MED ORDER — AMOXICILLIN-POT CLAVULANATE 875-125 MG PO TABS
1.0000 | ORAL_TABLET | Freq: Two times a day (BID) | ORAL | 0 refills | Status: DC
  Filled 2023-06-24: qty 20, 10d supply, fill #0

## 2023-06-24 NOTE — Progress Notes (Signed)
 Acute Office Visit  Subjective:     Patient ID: Lori Larsen, female    DOB: September 18, 1965, 58 y.o.   MRN: [id number]  Patient is in today for  Chief Complaint  Patient presents with   Acute Visit    Diarrhea    Started on Sunday, vomiting/diarrhea all day Monday, went to work, had an accident there.  Tuesday, Off and On issues Better today, did have solid BM today.   No fevers, just pain when needing BM.        Review of Systems  Gastrointestinal:  Positive for abdominal pain, diarrhea, nausea and vomiting.  All other systems reviewed and are negative.       Objective:    BP 112/78   Pulse 88   Ht 5' (1.524 m)   Wt 130 lb 9.6 oz (59.2 kg)   SpO2 98%   BMI 25.51 kg/m   Physical Exam Vitals and nursing note reviewed.  Constitutional:      Appearance: Normal appearance. She is normal weight.  HENT:     Head: Normocephalic.  Eyes:     Extraocular Movements: Extraocular movements intact.     Conjunctiva/sclera: Conjunctivae normal.     Pupils: Pupils are equal, round, and reactive to light.  Cardiovascular:     Rate and Rhythm: Normal rate.  Pulmonary:     Effort: Pulmonary effort is normal.  Neurological:     General: No focal deficit present.     Mental Status: She is alert and oriented to person, place, and time. Mental status is at baseline.  Psychiatric:        Mood and Affect: Mood normal.        Behavior: Behavior normal.        Thought Content: Thought content normal.        Judgment: Judgment normal.     Results for orders placed or performed in visit on 06/24/23  POCT Glucose (CBG)  Result Value Ref Range   POC Glucose 229 (A) 70 - 99 mg/dl  POCT XPERT XPRESS SARS COVID-2/FLU/RSV  Result Value Ref Range   SARS Coronavirus 2 Negative    FLU A Negative    FLU B Negative    RSV RNA, PCR Negative     Recent Results (from the past 2160 hours)  CBC with Diff     Status: Abnormal   Collection Time: 07/27/23 10:49 AM  Result Value  Ref Range   WBC 7.0 3.4 - 10.8 x10E3/uL   RBC 3.53 (L) 3.77 - 5.28 x10E6/uL   Hemoglobin 10.3 (L) 11.1 - 15.9 g/dL   Hematocrit 67.6 (L) 65.9 - 46.6 %   MCV 92 79 - 97 fL   MCH 29.2 26.6 - 33.0 pg   MCHC 31.9 31.5 - 35.7 g/dL   RDW 87.6 88.2 - 84.5 %   Platelets 243 150 - 450 x10E3/uL   Neutrophils 55 Not Estab. %   Lymphs 31 Not Estab. %   Monocytes 8 Not Estab. %   Eos 5 Not Estab. %   Basos 1 Not Estab. %   Neutrophils Absolute 3.8 1.4 - 7.0 x10E3/uL   Lymphocytes Absolute 2.2 0.7 - 3.1 x10E3/uL   Monocytes Absolute 0.6 0.1 - 0.9 x10E3/uL   EOS (ABSOLUTE) 0.4 0.0 - 0.4 x10E3/uL   Basophils Absolute 0.1 0.0 - 0.2 x10E3/uL   Immature Granulocytes 0 Not Estab. %   Immature Grans (Abs) 0.0 0.0 - 0.1 x10E3/uL  Hemoglobin A1c  Status: Abnormal   Collection Time: 07/27/23 10:49 AM  Result Value Ref Range   Hgb A1c MFr Bld 7.1 (H) 4.8 - 5.6 %    Comment:          Prediabetes: 5.7 - 6.4          Diabetes: >6.4          Glycemic control for adults with diabetes: <7.0    Est. average glucose Bld gHb Est-mCnc 157 mg/dL  RFE85+ZHQM     Status: Abnormal   Collection Time: 07/27/23 10:49 AM  Result Value Ref Range   Glucose 142 (H) 70 - 99 mg/dL   BUN 22 6 - 24 mg/dL   Creatinine, Ser 8.87 (H) 0.57 - 1.00 mg/dL   eGFR 57 (L) >40 fO/fpw/8.26   BUN/Creatinine Ratio 20 9 - 23   Sodium 144 134 - 144 mmol/L   Potassium 4.3 3.5 - 5.2 mmol/L   Chloride 108 (H) 96 - 106 mmol/L   CO2 19 (L) 20 - 29 mmol/L   Calcium  9.6 8.7 - 10.2 mg/dL   Total Protein 7.3 6.0 - 8.5 g/dL   Albumin 4.4 3.8 - 4.9 g/dL   Globulin, Total 2.9 1.5 - 4.5 g/dL   Bilirubin Total <9.7 0.0 - 1.2 mg/dL   Alkaline Phosphatase 112 44 - 121 IU/L   AST 15 0 - 40 IU/L   ALT 11 0 - 32 IU/L  Lipid Profile     Status: Abnormal   Collection Time: 07/27/23 10:49 AM  Result Value Ref Range   Cholesterol, Total 162 100 - 199 mg/dL   Triglycerides 750 (H) 0 - 149 mg/dL   HDL 42 >60 mg/dL   VLDL Cholesterol Cal 41 (H) 5  - 40 mg/dL   LDL Chol Calc (NIH) 79 0 - 99 mg/dL   Chol/HDL Ratio 3.9 0.0 - 4.4 ratio    Comment:                                   T. Chol/HDL Ratio                                             Men  Women                               1/2 Avg.Risk  3.4    3.3                                   Avg.Risk  5.0    4.4                                2X Avg.Risk  9.6    7.1                                3X Avg.Risk 23.4   11.0     Allergies as of 06/24/2023       Reactions   Metformin Nausea Only, Other (See Comments)        Medication List  Accurate as of June 24, 2023 11:59 PM. If you have any questions, ask your nurse or doctor.          amLODipine  5 MG tablet Commonly known as: NORVASC  Take 1 tablet (5 mg total) by mouth daily.   amoxicillin -clavulanate 875-125 MG tablet Commonly known as: AUGMENTIN  Take 1 tablet by mouth 2 (two) times daily. Started by: ALAN CHRISTELLA ARRANT   aspirin  325 MG tablet Take 1 tablet (325 mg total) by mouth daily.   Azelaic Acid  15 % gel Apply 1 Application topically 2 (two) times daily. After skin is thoroughly washed and patted dry, gently but thoroughly massage a thin film of azelaic acid  cream into the affected area twice daily, in the morning and evening.   celecoxib  200 MG capsule Commonly known as: CELEBREX  Take 1 capsule (200 mg total) by mouth 2 (two) times daily.   clopidogrel  75 MG tablet Commonly known as: PLAVIX  Take 1 tablet (75 mg total) by mouth daily.   ferrous sulfate  325 (65 FE) MG tablet Take 1 tablet by mouth 2 (two) times daily.   glipiZIDE  5 MG tablet Commonly known as: GLUCOTROL  Take 1 tablet (5 mg total) by mouth 2 (two) times daily.   hydrochlorothiazide  25 MG tablet Commonly known as: HYDRODIURIL  Take 1 tablet (25 mg total) by mouth every morning.   Insupen Pen Needles 32G X 4 MM Misc Generic drug: Insulin  Pen Needle Use pen needles with device for insulin  injections daily   lisinopril  20 MG  tablet Commonly known as: ZESTRIL  Take 1 tablet (20 mg total) by mouth daily.   Mounjaro  2.5 MG/0.5ML Pen Generic drug: tirzepatide  Inject 2.5 mg into the skin once a week.   omeprazole  40 MG capsule Commonly known as: PRILOSEC Take 1 capsule (40 mg total) by mouth daily.   rosuvastatin  5 MG tablet Commonly known as: CRESTOR  Take 1 tablet (5 mg total) by mouth daily.   Semglee  (yfgn) 100 UNIT/ML Pen Generic drug: insulin  glargine-yfgn Inject 20 Units into the skin daily.           Assessment & Plan Type 2 diabetes mellitus with hyperglycemia, without long-term current use of insulin  (HCC) Continue current diabetes POC, as patient has been well controlled on current regimen.  Will adjust meds if needed based on labs.   Acute upper respiratory infection COVID/Flu/RSV test in office today negative.  Will reassess at follow up.   Gastroenteritis Patient does appear to be improving.  She will let me know if she does not continue to do so over the next few days.     Return as previously scheduled unless not improving.  Total time spent: 20 minutes  ALAN CHRISTELLA ARRANT, FNP  06/24/2023   This document may have been prepared by [facility] Voice Recognition software and as such may include unintentional dictation errors.

## 2023-07-13 ENCOUNTER — Other Ambulatory Visit: Payer: Self-pay | Admitting: Family

## 2023-07-13 ENCOUNTER — Other Ambulatory Visit: Payer: Self-pay

## 2023-07-13 ENCOUNTER — Other Ambulatory Visit (HOSPITAL_COMMUNITY): Payer: Self-pay

## 2023-07-13 MED FILL — Tirzepatide Soln Auto-injector 2.5 MG/0.5ML: SUBCUTANEOUS | 28 days supply | Qty: 2 | Fill #3 | Status: AC

## 2023-07-13 MED FILL — Tirzepatide Soln Auto-injector 2.5 MG/0.5ML: SUBCUTANEOUS | 28 days supply | Qty: 2 | Fill #3 | Status: CN

## 2023-07-14 ENCOUNTER — Other Ambulatory Visit: Payer: Self-pay

## 2023-07-15 ENCOUNTER — Other Ambulatory Visit: Payer: Self-pay

## 2023-07-15 MED FILL — Insulin Glargine Soln Pen-Injector 300 Unit/ML (1 Unit Dial): SUBCUTANEOUS | 22 days supply | Qty: 1.5 | Fill #0 | Status: AC

## 2023-07-17 ENCOUNTER — Telehealth: Payer: Self-pay | Admitting: Family

## 2023-07-17 NOTE — Telephone Encounter (Signed)
 Patient called in complaining about the cost of her Toujeo  being $30 for one insulin  pen. The pharmacy told her that Lantus  is also covered by her insurance and she said she would rather go back on that. I advised her that it is a copay with her insurance based on the tier drug and it could also be that she hasn't met her deductible. Advised her to call the insurance company and see what her Lantus  copay would be before we send that in. She is going to call them and will let us  know what she wants to do.

## 2023-07-17 NOTE — Telephone Encounter (Signed)
 Patient had called earlier about her Semglee  no longer being covered and us  sending in Toujeo . She called her insurance and they told her to tell us  to do a PA for generic semglee .

## 2023-07-27 ENCOUNTER — Other Ambulatory Visit: Payer: Self-pay

## 2023-07-27 ENCOUNTER — Ambulatory Visit: Payer: Commercial Managed Care - PPO | Admitting: Family

## 2023-07-27 ENCOUNTER — Encounter: Payer: Self-pay | Admitting: Family

## 2023-07-27 VITALS — BP 122/76 | HR 71 | Ht 60.0 in | Wt 134.2 lb

## 2023-07-27 DIAGNOSIS — I10 Essential (primary) hypertension: Secondary | ICD-10-CM | POA: Diagnosis not present

## 2023-07-27 DIAGNOSIS — E559 Vitamin D deficiency, unspecified: Secondary | ICD-10-CM | POA: Diagnosis not present

## 2023-07-27 DIAGNOSIS — E1129 Type 2 diabetes mellitus with other diabetic kidney complication: Secondary | ICD-10-CM

## 2023-07-27 DIAGNOSIS — E782 Mixed hyperlipidemia: Secondary | ICD-10-CM

## 2023-07-27 MED ORDER — MOUNJARO 5 MG/0.5ML ~~LOC~~ SOAJ
5.0000 mg | SUBCUTANEOUS | 3 refills | Status: DC
  Filled 2023-07-27 – 2023-08-13 (×3): qty 2, 28d supply, fill #0
  Filled 2023-09-07: qty 2, 28d supply, fill #1
  Filled 2023-10-05: qty 2, 28d supply, fill #2
  Filled 2023-11-02: qty 2, 28d supply, fill #3

## 2023-07-27 NOTE — Assessment & Plan Note (Signed)
 Checking labs today.  Continue current therapy for lipid control. Will modify as needed based on labwork results.

## 2023-07-27 NOTE — Assessment & Plan Note (Signed)
 Blood pressure well controlled with current medications.  Continue current therapy.  Will reassess at follow up.

## 2023-07-27 NOTE — Assessment & Plan Note (Signed)
 Continue supplements as needed.

## 2023-07-27 NOTE — Progress Notes (Signed)
 Established Patient Office Visit  Subjective:  Patient ID: Lori Larsen, female    DOB: 1965/11/08  Age: 58 y.o. MRN: [id number]  Chief Complaint  Patient presents with   Follow-up    3 month follow up    Patient is here today for her 3 months follow up.  She has been feeling fairly well since last appointment.   She does have additional concerns to discuss today.  She has been having some difficulty getting her insulin  due to co-pay costs. She paid $30 for 1 Toujeo  pen and she is unable to afford this.  Asks if there are any other options we can use. The stomach upset she was having previously has resolved.  Labs are due today. She needs refills.   I have reviewed her active problem list, medication list, allergies, notes from last encounter, lab results for her appointment today.      No other concerns at this time.   Past Medical History:  Diagnosis Date   Diabetes mellitus without complication (HCC)    History of stroke 09/27/2019   Hypertension    Stroke [facility])    Syncope 03/18/2016    Past Surgical History:  Procedure Laterality Date   CESAREAN SECTION     COLONOSCOPY WITH PROPOFOL  N/A 04/18/2016   Procedure: COLONOSCOPY WITH PROPOFOL ;  Surgeon: Luke Salaam, MD;  Location: [facility] ENDOSCOPY;  Service: Endoscopy;  Laterality: N/A;   ESOPHAGOGASTRODUODENOSCOPY (EGD) WITH PROPOFOL  N/A 04/18/2016   Procedure: ESOPHAGOGASTRODUODENOSCOPY (EGD) WITH PROPOFOL ;  Surgeon: Luke Salaam, MD;  Location: [facility] ENDOSCOPY;  Service: Endoscopy;  Laterality: N/A;   EUS N/A 06/26/2016   Procedure: FULL UPPER ENDOSCOPIC ULTRASOUND (EUS) RADIAL;  Surgeon: Eloisa Hait, MD;  Location: [facility] ENDOSCOPY;  Service: Endoscopy;  Laterality: N/A;    Social History   Socioeconomic History   Marital status: Single    Spouse name: Not on file   Number of children: Not on file   Years of education: Not on file   Highest education level: Not on file  Occupational History   Occupation:  works at [employer]  Tobacco Use   Smoking status: Some Days    Current packs/day: 0.50    Average packs/day: 0.5 packs/day for 20.0 years (10.0 ttl pk-yrs)    Types: Cigarettes   Smokeless tobacco: Never  Substance and Sexual Activity   Alcohol use: Yes    Comment:  occasionally drinks beer   Drug use: No   Sexual activity: Yes    Birth control/protection: Post-menopausal  Other Topics Concern   Not on file  Social History Narrative   Not on file   Social Drivers of Health   Financial Resource Strain: Not on file  Food Insecurity: Not on file  Transportation Needs: Not on file  Physical Activity: Not on file  Stress: Not on file  Social Connections: Not on file  Intimate Partner Violence: Not on file    Family History  Problem Relation Age of Onset   Hypertension Mother    Hypertension Father    Breast cancer Neg Hx     Allergies  Allergen Reactions   Metformin Nausea Only and Other (See Comments)    Review of Systems  All other systems reviewed and are negative.      Objective:   BP 122/76   Pulse 71   Ht 5' (1.524 m)   Wt 134 lb 3.2 oz (60.9 kg)   SpO2 99%   BMI 26.21 kg/m   Vitals:  07/27/23 0956  BP: 122/76  Pulse: 71  Height: 5' (1.524 m)  Weight: 134 lb 3.2 oz (60.9 kg)  SpO2: 99%  BMI (Calculated): 26.21    Physical Exam Vitals and nursing note reviewed.  Constitutional:      Appearance: Normal appearance. She is normal weight.  HENT:     Head: Normocephalic.  Eyes:     Extraocular Movements: Extraocular movements intact.     Conjunctiva/sclera: Conjunctivae normal.     Pupils: Pupils are equal, round, and reactive to light.  Cardiovascular:     Rate and Rhythm: Normal rate.  Pulmonary:     Effort: Pulmonary effort is normal.  Neurological:     General: No focal deficit present.     Mental Status: She is alert and oriented to person, place, and time. Mental status is at baseline.  Psychiatric:        Mood and  Affect: Mood normal.        Behavior: Behavior normal.        Thought Content: Thought content normal.      No results found for any visits on 07/27/23.  Recent Results (from the past 2160 hours)  POCT CBG (Fasting - Glucose)     Status: Abnormal   Collection Time: 04/28/23 10:22 AM  Result Value Ref Range   Glucose Fasting, POC 207 (A) 70 - 99 mg/dL  POCT Glucose (CBG)     Status: Abnormal   Collection Time: 06/24/23  1:37 PM  Result Value Ref Range   POC Glucose 229 (A) 70 - 99 mg/dl  POCT XPERT XPRESS SARS COVID-2/FLU/RSV     Status: None   Collection Time: 06/24/23  2:47 PM  Result Value Ref Range   SARS Coronavirus 2 Negative    FLU A Negative    FLU B Negative    RSV RNA, PCR Negative        Assessment & Plan:   Problem List Items Addressed This Visit       Cardiovascular and Mediastinum   Essential (primary) hypertension   Blood pressure well controlled with current medications.  Continue current therapy.  Will reassess at follow up.          Endocrine   Type 2 diabetes mellitus with other diabetic kidney complication (HCC) - Primary   Checking labs today. Will call pt. With results  Continue current diabetes POC, as patient has been well controlled on current regimen.  Will adjust meds if needed based on labs.        Relevant Medications   tirzepatide  (MOUNJARO ) 5 MG/0.5ML Pen   Other Relevant Orders   CBC with Diff   Hemoglobin A1c   CMP14+EGFR   Lipid Profile     Other   Vitamin D  deficiency, unspecified   Continue supplements as needed.        Mixed hyperlipidemia   Checking labs today.  Continue current therapy for lipid control. Will modify as needed based on labwork results.         Return in about 3 months (around 10/27/2023).   Total time spent: 20 minutes  Trenda Frisk, FNP  07/27/2023   This document may have been prepared by [facility] Voice Recognition software and as such may include unintentional dictation errors.

## 2023-07-27 NOTE — Assessment & Plan Note (Signed)
 Checking labs today. Will call pt. With results  Continue current diabetes POC, as patient has been well controlled on current regimen.  Will adjust meds if needed based on labs.

## 2023-07-28 LAB — CMP14+EGFR
ALT: 11 IU/L (ref 0–32)
AST: 15 IU/L (ref 0–40)
Albumin: 4.4 g/dL (ref 3.8–4.9)
Alkaline Phosphatase: 112 IU/L (ref 44–121)
BUN/Creatinine Ratio: 20 (ref 9–23)
BUN: 22 mg/dL (ref 6–24)
Bilirubin Total: <0.2 mg/dL (ref 0.0–1.2)
CO2: 19 mmol/L — ABNORMAL LOW (ref 20–29)
Calcium: 9.6 mg/dL (ref 8.7–10.2)
Chloride: 108 mmol/L — ABNORMAL HIGH (ref 96–106)
Creatinine, Ser: 1.12 mg/dL — ABNORMAL HIGH (ref 0.57–1.00)
Globulin, Total: 2.9 g/dL (ref 1.5–4.5)
Glucose: 142 mg/dL — ABNORMAL HIGH (ref 70–99)
Potassium: 4.3 mmol/L (ref 3.5–5.2)
Sodium: 144 mmol/L (ref 134–144)
Total Protein: 7.3 g/dL (ref 6.0–8.5)
eGFR: 57 mL/min/{1.73_m2} — ABNORMAL LOW (ref >59)

## 2023-07-28 LAB — CBC WITH DIFFERENTIAL/PLATELET
Basophils Absolute: 0.1 10*3/uL (ref 0.0–0.2)
Basos: 1 %
EOS (ABSOLUTE): 0.4 10*3/uL (ref 0.0–0.4)
Eos: 5 %
Hematocrit: 32.3 % — ABNORMAL LOW (ref 34.0–46.6)
Hemoglobin: 10.3 g/dL — ABNORMAL LOW (ref 11.1–15.9)
Immature Grans (Abs): 0 10*3/uL (ref 0.0–0.1)
Immature Granulocytes: 0 %
Lymphocytes Absolute: 2.2 10*3/uL (ref 0.7–3.1)
Lymphs: 31 %
MCH: 29.2 pg (ref 26.6–33.0)
MCHC: 31.9 g/dL (ref 31.5–35.7)
MCV: 92 fL (ref 79–97)
Monocytes Absolute: 0.6 10*3/uL (ref 0.1–0.9)
Monocytes: 8 %
Neutrophils Absolute: 3.8 10*3/uL (ref 1.4–7.0)
Neutrophils: 55 %
Platelets: 243 10*3/uL (ref 150–450)
RBC: 3.53 x10E6/uL — ABNORMAL LOW (ref 3.77–5.28)
RDW: 12.3 % (ref 11.7–15.4)
WBC: 7 10*3/uL (ref 3.4–10.8)

## 2023-07-28 LAB — HEMOGLOBIN A1C
Est. average glucose Bld gHb Est-mCnc: 157 mg/dL
Hgb A1c MFr Bld: 7.1 % — ABNORMAL HIGH (ref 4.8–5.6)

## 2023-07-28 LAB — LIPID PANEL
Chol/HDL Ratio: 3.9 ratio (ref 0.0–4.4)
Cholesterol, Total: 162 mg/dL (ref 100–199)
HDL: 42 mg/dL (ref >39)
LDL Chol Calc (NIH): 79 mg/dL (ref 0–99)
Triglycerides: 249 mg/dL — ABNORMAL HIGH (ref 0–149)
VLDL Cholesterol Cal: 41 mg/dL — ABNORMAL HIGH (ref 5–40)

## 2023-08-10 ENCOUNTER — Other Ambulatory Visit: Payer: Self-pay

## 2023-08-13 ENCOUNTER — Other Ambulatory Visit: Payer: Self-pay

## 2023-09-07 ENCOUNTER — Other Ambulatory Visit: Payer: Self-pay

## 2023-09-07 ENCOUNTER — Other Ambulatory Visit: Payer: Self-pay | Admitting: Family

## 2023-09-07 DIAGNOSIS — I10 Essential (primary) hypertension: Secondary | ICD-10-CM

## 2023-09-07 MED FILL — Amlodipine Besylate Tab 5 MG (Base Equivalent): ORAL | 90 days supply | Qty: 90 | Fill #0 | Status: AC

## 2023-09-07 MED FILL — Celecoxib Cap 200 MG: ORAL | 90 days supply | Qty: 180 | Fill #1 | Status: AC

## 2023-09-07 MED FILL — Insulin Glargine Soln Pen-Injector 300 Unit/ML (1 Unit Dial): SUBCUTANEOUS | 22 days supply | Qty: 1.5 | Fill #1 | Status: AC

## 2023-09-08 ENCOUNTER — Other Ambulatory Visit: Payer: Self-pay

## 2023-09-27 ENCOUNTER — Encounter: Payer: Self-pay | Admitting: Family

## 2023-10-27 ENCOUNTER — Ambulatory Visit: Admitting: Family

## 2023-11-02 ENCOUNTER — Other Ambulatory Visit: Payer: Self-pay

## 2023-11-02 ENCOUNTER — Other Ambulatory Visit: Payer: Self-pay | Admitting: Family

## 2023-11-02 MED ORDER — CLOPIDOGREL BISULFATE 75 MG PO TABS
75.0000 mg | ORAL_TABLET | Freq: Every day | ORAL | 1 refills | Status: DC
  Filled 2023-11-02: qty 90, 90d supply, fill #0
  Filled 2024-02-01: qty 90, 90d supply, fill #1

## 2023-11-06 ENCOUNTER — Ambulatory Visit: Admitting: Cardiology

## 2023-11-06 ENCOUNTER — Encounter: Payer: Self-pay | Admitting: Cardiology

## 2023-11-06 VITALS — BP 126/70 | HR 91 | Ht 60.0 in | Wt 121.0 lb

## 2023-11-06 DIAGNOSIS — Z0000 Encounter for general adult medical examination without abnormal findings: Secondary | ICD-10-CM | POA: Diagnosis not present

## 2023-11-06 DIAGNOSIS — Z1211 Encounter for screening for malignant neoplasm of colon: Secondary | ICD-10-CM

## 2023-11-06 NOTE — Progress Notes (Signed)
 Complete physical exam  Patient: Lori Larsen   DOB: Oct 24, 1965   57 y.o. Female  MRN: [id number]  Subjective:    Chief Complaint  Patient presents with   Annual Exam    CPE Insurance Physical    Lori Larsen is a 58 y.o. female who presents today for a complete physical exam. She reports consuming a general diet. Home exercise routine includes walking 3 hrs per week. She generally feels well. She reports sleeping well. She does not have additional problems to discuss today.    Most recent fall risk assessment:     No data to display           Most recent depression screenings:    07/27/2023   10:22 AM  PHQ 2/9 Scores  PHQ - 2 Score 0  PHQ- 9 Score 2    Vision:Within last year and Dental: No current dental problems and Receives regular dental care  Past Medical History:  Diagnosis Date   Diabetes mellitus without complication (HCC)    History of stroke 09/27/2019   Hypertension    Stroke (HCC)    Syncope 03/18/2016    Past Surgical History:  Procedure Laterality Date   CESAREAN SECTION     COLONOSCOPY WITH PROPOFOL  N/A 04/18/2016   Procedure: COLONOSCOPY WITH PROPOFOL ;  Surgeon: Ruel Kung, MD;  Location: [facility];  Service: Endoscopy;  Laterality: N/A;   ESOPHAGOGASTRODUODENOSCOPY (EGD) WITH PROPOFOL  N/A 04/18/2016   Procedure: ESOPHAGOGASTRODUODENOSCOPY (EGD) WITH PROPOFOL ;  Surgeon: Ruel Kung, MD;  Location: [facility];  Service: Endoscopy;  Laterality: N/A;   EUS N/A 06/26/2016   Procedure: FULL UPPER ENDOSCOPIC ULTRASOUND (EUS) RADIAL;  Surgeon: Asberry DELENA Coffee, MD;  Location: [facility];  Service: Endoscopy;  Laterality: N/A;    Family History  Problem Relation Age of Onset   Hypertension Mother    Hypertension Father    Breast cancer Neg Hx     Social History   Socioeconomic History   Marital status: Single    Spouse name: Not on file   Number of children: Not on file   Years of education: Not on file   Highest  education level: Not on file  Occupational History   Occupation: works at [employer]  Tobacco Use   Smoking status: Some Days    Current packs/day: 0.50    Average packs/day: 0.5 packs/day for 20.0 years (10.0 ttl pk-yrs)    Types: Cigarettes   Smokeless tobacco: Never  Substance and Sexual Activity   Alcohol use: Yes    Comment:  occasionally drinks beer   Drug use: No   Sexual activity: Yes    Birth control/protection: Post-menopausal  Other Topics Concern   Not on file  Social History Narrative   Not on file   Social Drivers of Health   Financial Resource Strain: Not on file  Food Insecurity: Not on file  Transportation Needs: Not on file  Physical Activity: Not on file  Stress: Not on file  Social Connections: Not on file  Intimate Partner Violence: Not on file    Outpatient Medications Prior to Visit  Medication Sig   amLODipine  (NORVASC ) 5 MG tablet Take 1 tablet (5 mg total) by mouth daily.   aspirin  325 MG tablet Take 1 tablet (325 mg total) by mouth daily.   celecoxib  (CELEBREX ) 200 MG capsule Take 1 capsule (200 mg total) by mouth 2 (two) times daily.   clopidogrel  (PLAVIX ) 75 MG tablet Take 1 tablet (75 mg  total) by mouth daily.   ferrous sulfate  325 (65 FE) MG tablet Take 1 tablet by mouth 2 (two) times daily.   glipiZIDE  (GLUCOTROL ) 5 MG tablet Take 1 tablet (5 mg total) by mouth 2 (two) times daily.   hydrochlorothiazide  (HYDRODIURIL ) 25 MG tablet Take 1 tablet (25 mg total) by mouth every morning.   insulin  glargine, 1 Unit Dial , (TOUJEO  SOLOSTAR) 300 UNIT/ML Solostar Pen Inject 20 Units into the skin daily.   Insulin  Pen Needle (NOVOFINE PLUS PEN NEEDLE) 32G X 4 MM MISC Use pen needles with device for insulin  injections daily   lisinopril  (ZESTRIL ) 20 MG tablet Take 1 tablet (20 mg total) by mouth daily.   omeprazole  (PRILOSEC) 40 MG capsule Take 1 capsule (40 mg total) by mouth daily.   rosuvastatin  (CRESTOR ) 5 MG tablet Take 1 tablet (5 mg total)  by mouth daily.   tirzepatide  (MOUNJARO ) 5 MG/0.5ML Pen Inject 5 mg into the skin once a week.   No facility-administered medications prior to visit.    Review of Systems  Constitutional: Negative.   HENT: Negative.    Eyes: Negative.   Respiratory: Negative.  Negative for shortness of breath.   Cardiovascular: Negative.  Negative for chest pain.  Gastrointestinal: Negative.  Negative for abdominal pain, constipation and diarrhea.  Genitourinary: Negative.   Musculoskeletal:  Negative for joint pain and myalgias.  Skin: Negative.   Neurological: Negative.  Negative for dizziness and headaches.  Endo/Heme/Allergies: Negative.   All other systems reviewed and are negative.       Objective:     BP 126/70   Pulse 91   Ht 5' (1.524 m)   Wt 121 lb (54.9 kg)   SpO2 97%   BMI 23.63 kg/m  BP Readings from Last 3 Encounters:  11/06/23 126/70  07/27/23 122/76  06/24/23 112/78      Physical Exam Vitals and nursing note reviewed.  Constitutional:      Appearance: Normal appearance. She is normal weight.  HENT:     Head: Normocephalic and atraumatic.     Nose: Nose normal.     Mouth/Throat:     Mouth: Mucous membranes are moist.  Eyes:     Extraocular Movements: Extraocular movements intact.     Conjunctiva/sclera: Conjunctivae normal.     Pupils: Pupils are equal, round, and reactive to light.  Cardiovascular:     Rate and Rhythm: Normal rate and regular rhythm.     Pulses: Normal pulses.     Heart sounds: Normal heart sounds.  Pulmonary:     Effort: Pulmonary effort is normal.     Breath sounds: Normal breath sounds.  Abdominal:     General: Abdomen is flat. Bowel sounds are normal.     Palpations: Abdomen is soft.  Musculoskeletal:        General: Normal range of motion.     Cervical back: Normal range of motion.  Skin:    General: Skin is warm and dry.  Neurological:     General: No focal deficit present.     Mental Status: She is alert and oriented to  person, place, and time.  Psychiatric:        Mood and Affect: Mood normal.        Behavior: Behavior normal.        Thought Content: Thought content normal.        Judgment: Judgment normal.      No results found for any visits on 11/06/23.  No results  found for this or any previous visit (from the past 2160 hours).      Assessment & Plan:    Routine Health Maintenance and Physical Exam  Immunization History  Administered Date(s) Administered   Influenza Split 12/06/2010   Influenza, Mdck, Trivalent,PF 6+ MOS(egg free) 12/08/2022   PFIZER(Purple Top)SARS-COV-2 Vaccination 06/09/2019, 07/06/2019   Tdap 12/06/2010    Health Maintenance  Topic Date Due   FOOT EXAM  Never done   OPHTHALMOLOGY EXAM  Never done   Pneumococcal Vaccine: 50+ Years (1 of 2 - PCV) Never done   Hepatitis B Vaccines 19-59 Average Risk (1 of 3 - 19+ 3-dose series) Never done   Cervical Cancer Screening (HPV/Pap Cotest)  Never done   Zoster Vaccines- Shingrix (1 of 2) Never done   DTaP/Tdap/Td (2 - Td or Tdap) 12/05/2020   Colonoscopy  04/18/2021   COVID-19 Vaccine (3 - 2024-25 season) 11/09/2022   Diabetic kidney evaluation - Urine ACR  06/04/2023   INFLUENZA VACCINE  10/09/2023   HEMOGLOBIN A1C  01/27/2024   Diabetic kidney evaluation - eGFR measurement  07/26/2024   MAMMOGRAM  03/29/2025   Hepatitis C Screening  Completed   HIV Screening  Completed   HPV VACCINES  Aged Out   Meningococcal B Vaccine  Aged Out    Discussed health benefits of physical activity, and encouraged her to engage in regular exercise appropriate for her age and condition.  Problem List Items Addressed This Visit       Other   Encounter for annual health examination - Primary   Other Visit Diagnoses       Colon cancer screening       Relevant Orders   Ambulatory referral to Gastroenterology      Return if symptoms worsen or fail to improve, for as scheduled.     Jeoffrey Pollen, NP  11/06/2023   This  document may have been prepared by [facility] Voice Recognition software and as such may include unintentional dictation errors.

## 2023-11-10 ENCOUNTER — Encounter: Payer: Self-pay | Admitting: Family

## 2023-11-10 ENCOUNTER — Other Ambulatory Visit: Payer: Self-pay

## 2023-11-10 ENCOUNTER — Ambulatory Visit: Admitting: Family

## 2023-11-10 VITALS — BP 128/72 | HR 82 | Ht 60.0 in | Wt 125.8 lb

## 2023-11-10 DIAGNOSIS — E782 Mixed hyperlipidemia: Secondary | ICD-10-CM

## 2023-11-10 DIAGNOSIS — E1129 Type 2 diabetes mellitus with other diabetic kidney complication: Secondary | ICD-10-CM

## 2023-11-10 DIAGNOSIS — I10 Essential (primary) hypertension: Secondary | ICD-10-CM

## 2023-11-10 DIAGNOSIS — E559 Vitamin D deficiency, unspecified: Secondary | ICD-10-CM

## 2023-11-10 LAB — POC CREATINE & ALBUMIN,URINE
Creatinine, POC: 100 mg/dL
Microalbumin Ur, POC: 80 mg/L

## 2023-11-10 LAB — POCT CBG (FASTING - GLUCOSE)-MANUAL ENTRY: Glucose Fasting, POC: 182 mg/dL — AB (ref 70–99)

## 2023-11-10 MED ORDER — TRESIBA FLEXTOUCH 100 UNIT/ML ~~LOC~~ SOPN
20.0000 [IU] | PEN_INJECTOR | Freq: Every day | SUBCUTANEOUS | 3 refills | Status: AC
  Filled 2023-11-10 – 2023-12-14 (×2): qty 6, 30d supply, fill #0

## 2023-11-10 NOTE — Assessment & Plan Note (Signed)
 Continue current therapy for lipid control. Will modify as needed based on labwork results.

## 2023-11-10 NOTE — Assessment & Plan Note (Signed)
 Will continue supplements as needed.

## 2023-11-10 NOTE — Assessment & Plan Note (Addendum)
 uACR done in office today.  Sending RX for Tresiba  for patient.  Continue at 20 units at bedtime.  Patient given Copay card.  Recheck levels at follow up appointment.

## 2023-11-10 NOTE — Progress Notes (Signed)
 Established Patient Office Visit  Subjective:  Patient ID: Lori Larsen, female    DOB: 1965/11/19  Age: 58 y.o. MRN: [id number]  Chief Complaint  Patient presents with   Follow-up    3 month follow up    Patient is here today for her 3 months follow up.  She has been feeling fairly well since last appointment.   She does have additional concerns to discuss today.  She asks if we can possibly switch back to the Tresiba , says that her insulin  is still very expensive.   Labs are not due today.  She needs refills.   I have reviewed her active problem list, medication list, allergies, notes from last encounter, lab results for her appointment today.      No other concerns at this time.   Past Medical History:  Diagnosis Date   Diabetes mellitus without complication (HCC)    History of stroke 09/27/2019   Hypertension    Stroke (HCC)    Syncope 03/18/2016    Past Surgical History:  Procedure Laterality Date   CESAREAN SECTION     COLONOSCOPY WITH PROPOFOL  N/A 04/18/2016   Procedure: COLONOSCOPY WITH PROPOFOL ;  Surgeon: Ruel Kung, MD;  Location: [facility];  Service: Endoscopy;  Laterality: N/A;   ESOPHAGOGASTRODUODENOSCOPY (EGD) WITH PROPOFOL  N/A 04/18/2016   Procedure: ESOPHAGOGASTRODUODENOSCOPY (EGD) WITH PROPOFOL ;  Surgeon: Ruel Kung, MD;  Location: [facility];  Service: Endoscopy;  Laterality: N/A;   EUS N/A 06/26/2016   Procedure: FULL UPPER ENDOSCOPIC ULTRASOUND (EUS) RADIAL;  Surgeon: Asberry DELENA Coffee, MD;  Location: [facility];  Service: Endoscopy;  Laterality: N/A;    Social History   Socioeconomic History   Marital status: Single    Spouse name: Not on file   Number of children: Not on file   Years of education: Not on file   Highest education level: Not on file  Occupational History   Occupation: works at [employer]  Tobacco Use   Smoking status: Some Days    Current packs/day: 0.50    Average packs/day: 0.5 packs/day for  20.0 years (10.0 ttl pk-yrs)    Types: Cigarettes   Smokeless tobacco: Never  Substance and Sexual Activity   Alcohol use: Yes    Comment:  occasionally drinks beer   Drug use: No   Sexual activity: Yes    Birth control/protection: Post-menopausal  Other Topics Concern   Not on file  Social History Narrative   Not on file   Social Drivers of Health   Financial Resource Strain: Not on file  Food Insecurity: Not on file  Transportation Needs: Not on file  Physical Activity: Not on file  Stress: Not on file  Social Connections: Not on file  Intimate Partner Violence: Not on file    Family History  Problem Relation Age of Onset   Hypertension Mother    Hypertension Father    Breast cancer Neg Hx     Allergies  Allergen Reactions   Metformin Nausea Only and Other (See Comments)    Review of Systems  All other systems reviewed and are negative.      Objective:   BP 128/72   Pulse 82   Ht 5' (1.524 m)   Wt 125 lb 12.8 oz (57.1 kg)   SpO2 99%   BMI 24.57 kg/m   Vitals:   11/10/23 1007  BP: 128/72  Pulse: 82  Height: 5' (1.524 m)  Weight: 125 lb 12.8 oz (57.1 kg)  SpO2:  99%  BMI (Calculated): 24.57    Physical Exam Vitals and nursing note reviewed.  Constitutional:      Appearance: Normal appearance. She is normal weight.  HENT:     Head: Normocephalic.  Eyes:     Extraocular Movements: Extraocular movements intact.     Conjunctiva/sclera: Conjunctivae normal.     Pupils: Pupils are equal, round, and reactive to light.  Cardiovascular:     Rate and Rhythm: Normal rate and regular rhythm.  Pulmonary:     Effort: Pulmonary effort is normal.     Breath sounds: Normal breath sounds.  Musculoskeletal:        General: Normal range of motion.  Neurological:     General: No focal deficit present.     Mental Status: She is alert and oriented to person, place, and time. Mental status is at baseline.  Psychiatric:        Mood and Affect: Mood normal.         Behavior: Behavior normal.        Thought Content: Thought content normal.        Judgment: Judgment normal.      Results for orders placed or performed in visit on 11/10/23  POCT CBG (Fasting - Glucose)  Result Value Ref Range   Glucose Fasting, POC 182 (A) 70 - 99 mg/dL    Recent Results (from the past 2160 hours)  POCT CBG (Fasting - Glucose)     Status: Abnormal   Collection Time: 11/10/23 10:17 AM  Result Value Ref Range   Glucose Fasting, POC 182 (A) 70 - 99 mg/dL       Assessment & Plan Type 2 diabetes mellitus with other diabetic kidney complication (HCC) uACR done in office today.  Sending RX for Tresiba  for patient.  Continue at 20 units at bedtime.  Patient given Copay card.  Recheck levels at follow up appointment.  Essential (primary) hypertension Blood pressure well controlled with current medications.  Continue current therapy.  Will reassess at follow up.   Mixed hyperlipidemia Continue current therapy for lipid control. Will modify as needed based on labwork results.   Vitamin D  deficiency, unspecified Will continue supplements as needed.      Return in about 3 months (around 02/09/2024).   Total time spent: 20 minutes  ALAN CHRISTELLA ARRANT, FNP  11/10/2023   This document may have been prepared by [facility] Voice Recognition software and as such may include unintentional dictation errors.

## 2023-11-10 NOTE — Assessment & Plan Note (Signed)
 Blood pressure well controlled with current medications.  Continue current therapy.  Will reassess at follow up.

## 2023-11-23 ENCOUNTER — Other Ambulatory Visit: Payer: Self-pay

## 2023-11-30 ENCOUNTER — Other Ambulatory Visit: Payer: Self-pay | Admitting: Family

## 2023-11-30 ENCOUNTER — Other Ambulatory Visit: Payer: Self-pay

## 2023-11-30 DIAGNOSIS — E1129 Type 2 diabetes mellitus with other diabetic kidney complication: Secondary | ICD-10-CM

## 2023-11-30 MED FILL — Tirzepatide Soln Auto-injector 5 MG/0.5ML: SUBCUTANEOUS | 28 days supply | Qty: 2 | Fill #0 | Status: CN

## 2023-12-01 ENCOUNTER — Other Ambulatory Visit: Payer: Self-pay

## 2023-12-03 ENCOUNTER — Other Ambulatory Visit: Payer: Self-pay

## 2023-12-11 ENCOUNTER — Other Ambulatory Visit: Payer: Self-pay

## 2023-12-14 ENCOUNTER — Other Ambulatory Visit: Payer: Self-pay

## 2023-12-14 MED FILL — Tirzepatide Soln Auto-injector 5 MG/0.5ML: SUBCUTANEOUS | 28 days supply | Qty: 2 | Fill #0 | Status: AC

## 2023-12-28 ENCOUNTER — Other Ambulatory Visit: Payer: Self-pay

## 2023-12-28 ENCOUNTER — Other Ambulatory Visit: Payer: Self-pay | Admitting: Family

## 2023-12-28 DIAGNOSIS — M150 Primary generalized (osteo)arthritis: Secondary | ICD-10-CM

## 2023-12-28 MED ORDER — OMEPRAZOLE 40 MG PO CPDR
40.0000 mg | DELAYED_RELEASE_CAPSULE | Freq: Every day | ORAL | 1 refills | Status: AC
  Filled 2023-12-28 – 2024-04-04 (×4): qty 90, 90d supply, fill #0

## 2023-12-28 MED ORDER — HYDROCHLOROTHIAZIDE 25 MG PO TABS
25.0000 mg | ORAL_TABLET | Freq: Every morning | ORAL | 1 refills | Status: AC
  Filled 2023-12-28 – 2024-04-04 (×4): qty 90, 90d supply, fill #0

## 2023-12-28 MED ORDER — ROSUVASTATIN CALCIUM 5 MG PO TABS
5.0000 mg | ORAL_TABLET | Freq: Every day | ORAL | 1 refills | Status: AC
  Filled 2023-12-28 – 2024-04-04 (×4): qty 90, 90d supply, fill #0

## 2023-12-28 MED ORDER — CELECOXIB 200 MG PO CAPS
200.0000 mg | ORAL_CAPSULE | Freq: Two times a day (BID) | ORAL | 1 refills | Status: AC
  Filled 2023-12-28 – 2024-04-04 (×4): qty 180, 90d supply, fill #0

## 2023-12-28 MED ORDER — GLIPIZIDE 5 MG PO TABS
5.0000 mg | ORAL_TABLET | Freq: Two times a day (BID) | ORAL | 1 refills | Status: AC
  Filled 2023-12-28 – 2024-03-29 (×2): qty 180, 90d supply, fill #0
  Filled 2024-04-04: qty 105, 53d supply, fill #0
  Filled 2024-04-04: qty 180, 90d supply, fill #0
  Filled 2024-04-04: qty 75, 37d supply, fill #0

## 2023-12-28 MED FILL — Amlodipine Besylate Tab 5 MG (Base Equivalent): ORAL | 90 days supply | Qty: 90 | Fill #1 | Status: AC

## 2024-01-04 ENCOUNTER — Other Ambulatory Visit: Payer: Self-pay

## 2024-01-04 MED FILL — Tirzepatide Soln Auto-injector 5 MG/0.5ML: SUBCUTANEOUS | 28 days supply | Qty: 2 | Fill #1 | Status: CN

## 2024-01-06 ENCOUNTER — Other Ambulatory Visit: Payer: Self-pay

## 2024-01-06 MED FILL — Tirzepatide Soln Auto-injector 5 MG/0.5ML: SUBCUTANEOUS | 28 days supply | Qty: 2 | Fill #1 | Status: AC

## 2024-02-01 MED FILL — Tirzepatide Soln Auto-injector 5 MG/0.5ML: SUBCUTANEOUS | 28 days supply | Qty: 2 | Fill #2 | Status: AC

## 2024-02-09 ENCOUNTER — Encounter: Payer: Self-pay | Admitting: Family

## 2024-02-09 ENCOUNTER — Ambulatory Visit: Admitting: Family

## 2024-02-09 VITALS — BP 126/78 | HR 82 | Ht 60.0 in | Wt 120.0 lb

## 2024-02-09 DIAGNOSIS — E1129 Type 2 diabetes mellitus with other diabetic kidney complication: Secondary | ICD-10-CM

## 2024-02-09 DIAGNOSIS — I10 Essential (primary) hypertension: Secondary | ICD-10-CM

## 2024-02-09 DIAGNOSIS — E559 Vitamin D deficiency, unspecified: Secondary | ICD-10-CM | POA: Diagnosis not present

## 2024-02-09 DIAGNOSIS — E782 Mixed hyperlipidemia: Secondary | ICD-10-CM

## 2024-02-09 DIAGNOSIS — R5383 Other fatigue: Secondary | ICD-10-CM

## 2024-02-09 LAB — POCT CBG (FASTING - GLUCOSE)-MANUAL ENTRY: Glucose Fasting, POC: 141 mg/dL — AB (ref 70–99)

## 2024-02-09 NOTE — Progress Notes (Unsigned)
 Established Patient Office Visit  Subjective:  Patient ID: Lori Larsen, female    DOB: [birth year]  Age: 58 y.o. MRN: [id number]  Chief Complaint  Patient presents with  . Follow-up    3 month follow up    HPI  No other concerns at this time.   Past Medical History:  Diagnosis Date  . Diabetes mellitus without complication (HCC)   . History of stroke 09/27/2019  . Hypertension   . Stroke (HCC)   . Syncope 03/18/2016    Past Surgical History:  Procedure Laterality Date  . CESAREAN SECTION    . COLONOSCOPY WITH PROPOFOL  N/A 04/18/2016   Procedure: COLONOSCOPY WITH PROPOFOL ;  Surgeon: Ruel Kung, MD;  Location: [facility];  Service: Endoscopy;  Laterality: N/A;  . ESOPHAGOGASTRODUODENOSCOPY (EGD) WITH PROPOFOL  N/A 04/18/2016   Procedure: ESOPHAGOGASTRODUODENOSCOPY (EGD) WITH PROPOFOL ;  Surgeon: Ruel Kung, MD;  Location: [facility];  Service: Endoscopy;  Laterality: N/A;  . EUS N/A 06/26/2016   Procedure: FULL UPPER ENDOSCOPIC ULTRASOUND (EUS) RADIAL;  Surgeon: Asberry DELENA Coffee, MD;  Location: [facility];  Service: Endoscopy;  Laterality: N/A;    Social History   Socioeconomic History  . Marital status: Single    Spouse name: Not on file  . Number of children: Not on file  . Years of education: Not on file  . Highest education level: Not on file  Occupational History  . Occupation: works at [employer]  . Smoking status: Some Days    Current packs/day: 0.50    Average packs/day: 0.5 packs/day for 20.0 years (10.0 ttl pk-yrs)    Types: Cigarettes  . Smokeless tobacco: Never  Substance and Sexual Activity  . Alcohol use: Yes    Comment:  occasionally drinks beer  . Drug use: No  . Sexual activity: Yes    Birth control/protection: Post-menopausal  Other Topics Concern  . Not on file  Social History Narrative  . Not on file   Social Drivers of Health   Financial Resource Strain: Not on file  Food Insecurity: Not on file   Transportation Needs: Not on file  Physical Activity: Not on file  Stress: Not on file  Social Connections: Not on file  Intimate Partner Violence: Not on file    Family History  Problem Relation Age of Onset  . Hypertension Mother   . Hypertension Father   . Breast cancer Neg Hx     Allergies  Allergen Reactions  . Metformin Nausea Only and Other (See Comments)    Review of Systems  All other systems reviewed and are negative.      Objective:   BP 126/78   Pulse 82   Ht 5' (1.524 m)   Wt 120 lb (54.4 kg)   SpO2 95%   BMI 23.44 kg/m   Vitals:   02/09/24 0906  BP: 126/78  Pulse: 82  Height: 5' (1.524 m)  Weight: 120 lb (54.4 kg)  SpO2: 95%  BMI (Calculated): 23.44    Physical Exam Vitals and nursing note reviewed.  Constitutional:      Appearance: Normal appearance. She is normal weight.  HENT:     Head: Normocephalic.  Eyes:     Extraocular Movements: Extraocular movements intact.     Conjunctiva/sclera: Conjunctivae normal.     Pupils: Pupils are equal, round, and reactive to light.  Cardiovascular:     Rate and Rhythm: Normal rate.  Pulmonary:     Effort: Pulmonary effort is normal.  Neurological:     General: No focal deficit present.     Mental Status: She is alert and oriented to person, place, and time. Mental status is at baseline.  Psychiatric:        Mood and Affect: Mood normal.        Behavior: Behavior normal.        Thought Content: Thought content normal.      Results for orders placed or performed in visit on 02/09/24  POCT CBG (Fasting - Glucose)  Result Value Ref Range   Glucose Fasting, POC 141 (A) 70 - 99 mg/dL    Recent Results (from the past 2160 hours)  POCT CBG (Fasting - Glucose)     Status: Abnormal   Collection Time: 02/09/24  9:16 AM  Result Value Ref Range   Glucose Fasting, POC 141 (A) 70 - 99 mg/dL       Assessment & Plan:   Assessment & Plan Type 2 diabetes mellitus with other diabetic kidney  complication (HCC)     No follow-ups on file.   Total time spent: {AMA time spent:29001} minutes  ALAN CHRISTELLA ARRANT, FNP  02/09/2024   This document may have been prepared by [facility] Voice Recognition software and as such may include unintentional dictation errors.

## 2024-02-10 NOTE — Assessment & Plan Note (Signed)
 Will continue supplements as needed.

## 2024-02-10 NOTE — Assessment & Plan Note (Signed)
 Blood pressure well controlled with current medications.  Continue current therapy.  Will reassess at follow up.

## 2024-02-10 NOTE — Assessment & Plan Note (Signed)
 Checking labs today.  Continue current therapy for lipid control. Will modify as needed based on labwork results.   -CMP w/eGFR -Lipid Panel

## 2024-02-10 NOTE — Assessment & Plan Note (Signed)
 Continue current diabetes POC, as patient has been well controlled on current regimen.  Will adjust meds if needed based on labs.

## 2024-02-29 MED FILL — Tirzepatide Soln Auto-injector 5 MG/0.5ML: SUBCUTANEOUS | 28 days supply | Qty: 2 | Fill #3 | Status: AC

## 2024-03-07 ENCOUNTER — Other Ambulatory Visit: Payer: Self-pay | Admitting: Family

## 2024-03-07 DIAGNOSIS — Z1231 Encounter for screening mammogram for malignant neoplasm of breast: Secondary | ICD-10-CM

## 2024-03-29 ENCOUNTER — Other Ambulatory Visit: Payer: Self-pay | Admitting: Family

## 2024-03-29 ENCOUNTER — Other Ambulatory Visit: Payer: Self-pay

## 2024-03-29 ENCOUNTER — Other Ambulatory Visit: Payer: Self-pay | Admitting: Cardiology

## 2024-03-29 DIAGNOSIS — E1129 Type 2 diabetes mellitus with other diabetic kidney complication: Secondary | ICD-10-CM

## 2024-03-29 MED ORDER — MOUNJARO 5 MG/0.5ML ~~LOC~~ SOAJ
5.0000 mg | SUBCUTANEOUS | 3 refills | Status: AC
  Filled 2024-03-29: qty 2, 28d supply, fill #0

## 2024-03-29 MED ORDER — CLOPIDOGREL BISULFATE 75 MG PO TABS
75.0000 mg | ORAL_TABLET | Freq: Every day | ORAL | 1 refills | Status: AC
  Filled 2024-03-29: qty 90, 90d supply, fill #0

## 2024-03-29 MED FILL — Amlodipine Besylate Tab 5 MG (Base Equivalent): ORAL | 90 days supply | Qty: 90 | Fill #0 | Status: CN

## 2024-03-31 ENCOUNTER — Other Ambulatory Visit: Payer: Self-pay

## 2024-03-31 ENCOUNTER — Ambulatory Visit
Admission: RE | Admit: 2024-03-31 | Discharge: 2024-03-31 | Disposition: A | Discharge status: Home / Self Care | Source: Ambulatory Visit | Attending: Family | Admitting: Family

## 2024-03-31 DIAGNOSIS — Z1231 Encounter for screening mammogram for malignant neoplasm of breast: Secondary | ICD-10-CM | POA: Diagnosis present

## 2024-04-04 ENCOUNTER — Other Ambulatory Visit: Payer: Self-pay

## 2024-04-04 ENCOUNTER — Other Ambulatory Visit (HOSPITAL_COMMUNITY): Payer: Self-pay

## 2024-04-04 MED FILL — Amlodipine Besylate Tab 5 MG (Base Equivalent): ORAL | 90 days supply | Qty: 90 | Fill #0 | Status: AC

## 2024-04-04 MED FILL — Amlodipine Besylate Tab 5 MG (Base Equivalent): ORAL | 90 days supply | Qty: 90 | Fill #0 | Status: CN

## 2024-05-10 ENCOUNTER — Ambulatory Visit: Admitting: Family
